# Patient Record
Sex: Female | Born: 1960 | Race: Black or African American | Hispanic: No | Marital: Married | Smoking: Never smoker
Health system: Southern US, Community
[De-identification: ages and names within clinical notes are randomized; demographics above are authoritative.]

## PROBLEM LIST (undated history)

## (undated) DIAGNOSIS — M1990 Unspecified osteoarthritis, unspecified site: Secondary | ICD-10-CM

## (undated) DIAGNOSIS — I10 Essential (primary) hypertension: Secondary | ICD-10-CM

## (undated) DIAGNOSIS — R7303 Prediabetes: Secondary | ICD-10-CM

## (undated) DIAGNOSIS — K432 Incisional hernia without obstruction or gangrene: Secondary | ICD-10-CM

## (undated) HISTORY — PX: HERNIA REPAIR: SHX51

## (undated) HISTORY — PX: CARPAL TUNNEL RELEASE: SHX101

## (undated) HISTORY — PX: PARTIAL KNEE ARTHROPLASTY: SHX2174

---

## 1992-04-10 HISTORY — PX: CARPAL TUNNEL RELEASE: SHX101

## 1994-04-10 HISTORY — PX: HERNIA REPAIR: SHX51

## 2011-10-08 ENCOUNTER — Emergency Department (HOSPITAL_COMMUNITY)
Admission: EM | Admit: 2011-10-08 | Discharge: 2011-10-08 | Disposition: A | Discharge status: Home / Self Care | Payer: BC Managed Care – PPO | Attending: Emergency Medicine | Admitting: Emergency Medicine

## 2011-10-08 ENCOUNTER — Encounter (HOSPITAL_COMMUNITY): Payer: Self-pay | Admitting: *Deleted

## 2011-10-08 ENCOUNTER — Emergency Department (HOSPITAL_COMMUNITY): Payer: BC Managed Care – PPO

## 2011-10-08 DIAGNOSIS — R071 Chest pain on breathing: Secondary | ICD-10-CM | POA: Insufficient documentation

## 2011-10-08 DIAGNOSIS — R109 Unspecified abdominal pain: Secondary | ICD-10-CM | POA: Insufficient documentation

## 2011-10-08 DIAGNOSIS — R0602 Shortness of breath: Secondary | ICD-10-CM | POA: Insufficient documentation

## 2011-10-08 DIAGNOSIS — R42 Dizziness and giddiness: Secondary | ICD-10-CM | POA: Insufficient documentation

## 2011-10-08 DIAGNOSIS — R0789 Other chest pain: Secondary | ICD-10-CM

## 2011-10-08 HISTORY — DX: Unspecified osteoarthritis, unspecified site: M19.90

## 2011-10-08 LAB — BASIC METABOLIC PANEL
BUN: 17 mg/dL (ref 6–23)
CO2: 25 mEq/L (ref 19–32)
Calcium: 9.2 mg/dL (ref 8.4–10.5)
Chloride: 102 mEq/L (ref 96–112)
Creatinine, Ser: 0.82 mg/dL (ref 0.50–1.10)
GFR calc Af Amer: >90 mL/min (ref >90)
GFR calc non Af Amer: 83 mL/min — ABNORMAL LOW (ref >90)
Glucose, Bld: 147 mg/dL — ABNORMAL HIGH (ref 70–99)
Potassium: 4.1 mEq/L (ref 3.5–5.1)
Sodium: 137 mEq/L (ref 135–145)

## 2011-10-08 LAB — CBC
HCT: 35.9 % — ABNORMAL LOW (ref 36.0–46.0)
Hemoglobin: 11.4 g/dL — ABNORMAL LOW (ref 12.0–15.0)
MCH: 28.3 pg (ref 26.0–34.0)
MCHC: 31.8 g/dL (ref 30.0–36.0)
MCV: 89.1 fL (ref 78.0–100.0)
Platelets: 195 10*3/uL (ref 150–400)
RBC: 4.03 MIL/uL (ref 3.87–5.11)
RDW: 12.8 % (ref 11.5–15.5)
WBC: 4.3 10*3/uL (ref 4.0–10.5)

## 2011-10-08 LAB — URINALYSIS, ROUTINE W REFLEX MICROSCOPIC
Bilirubin Urine: NEGATIVE
Glucose, UA: NEGATIVE mg/dL
Hgb urine dipstick: NEGATIVE
Ketones, ur: NEGATIVE mg/dL
Leukocytes, UA: NEGATIVE
Nitrite: NEGATIVE
Protein, ur: NEGATIVE mg/dL
Specific Gravity, Urine: 1.017 (ref 1.005–1.030)
Urobilinogen, UA: 0.2 mg/dL (ref 0.0–1.0)
pH: 6.5 (ref 5.0–8.0)

## 2011-10-08 LAB — D-DIMER, QUANTITATIVE: D-Dimer, Quant: 0.22 ug/mL-FEU (ref 0.00–0.48)

## 2011-10-08 MED ORDER — MECLIZINE HCL 50 MG PO TABS: 50.0000 mg | ORAL_TABLET | Freq: Three times a day (TID) | ORAL | Status: AC | PRN

## 2011-10-08 MED ORDER — MECLIZINE HCL 25 MG PO TABS
25.0000 mg | ORAL_TABLET | Freq: Once | ORAL | Status: AC
  Administered 2011-10-08: 25 mg via ORAL
  Filled 2011-10-08: qty 1

## 2011-10-08 NOTE — ED Notes (Signed)
Pt reports having left upper side pain since Thursday, woke up this am with dizziness and feeling off balance. No neuro deficits noted at triage.

## 2011-10-08 NOTE — Discharge Instructions (Signed)

## 2011-10-08 NOTE — ED Provider Notes (Addendum)
History     CSN: [id number]  Arrival date & time 10/08/11  1125   First MD Initiated Contact with Patient 10/08/11 1148      Chief Complaint  Patient presents with  . Dizziness  . Abdominal Pain    (Consider location/radiation/quality/duration/timing/severity/associated sxs/prior treatment) HPI Comments: Patient states also this morning when she was getting ready for church she got of bed and felt like the room was spinning. It was worse if she moved her head or tried to walk. It also caused her to feel nauseated. Symptoms have improved now however she still feels a little off balance.  Patient is a 51 y.o. female presenting with chest pain. The history is provided by the patient.  Chest Pain The chest pain began 3 - 5 days ago. Chest pain occurs constantly. The chest pain is unchanged. The pain is associated with breathing and coughing. At its most intense, the pain is at 6/10. The pain is currently at 4/10. The severity of the pain is moderate. The quality of the pain is described as pleuritic, sharp and stabbing. The pain radiates to the upper back. Chest pain is worsened by deep breathing. Primary symptoms include shortness of breath and dizziness. Pertinent negatives for primary symptoms include no fever, no cough, no palpitations, no abdominal pain, no nausea and no vomiting.  Dizziness does not occur with nausea, vomiting, weakness or diaphoresis.  Pertinent negatives for associated symptoms include no diaphoresis and no weakness. She tried NSAIDs for the symptoms. Risk factors include no known risk factors.  Pertinent negatives for past medical history include no diabetes, no hyperlipidemia, no hypertension, no MI and no PE.  Procedure history is negative for cardiac catheterization.     Past Medical History  Diagnosis Date  . Arthritis     History reviewed. No pertinent past surgical history.  History reviewed. No pertinent family history.  History  Substance Use Topics   . Smoking status: Not on file  . Smokeless tobacco: Not on file  . Alcohol Use: No    OB History    Grav Para Term Preterm Abortions TAB SAB Ect Mult Living                  Review of Systems  Constitutional: Negative for fever and diaphoresis.  Respiratory: Positive for shortness of breath. Negative for cough.   Cardiovascular: Positive for chest pain. Negative for palpitations.  Gastrointestinal: Negative for nausea, vomiting and abdominal pain.  Neurological: Positive for dizziness. Negative for weakness.  All other systems reviewed and are negative.    Allergies  Penicillins  Home Medications   Current Outpatient Rx  Name Route Sig Dispense Refill  . MELOXICAM 15 MG PO TABS Oral Take 15 mg by mouth daily.    Marland Kitchen NAPROXEN SODIUM 220 MG PO TABS Oral Take 440 mg by mouth daily as needed. For pain      BP 127/71  Pulse 76  Temp 98.2 F (36.8 C) (Oral)  Resp 18  SpO2 99%  LMP 09/22/2011  Physical Exam  Nursing note and vitals reviewed. Constitutional: She is oriented to person, place, and time. She appears well-developed and well-nourished. No distress.  HENT:  Head: Normocephalic and atraumatic.  Eyes: EOM are normal. Pupils are equal, round, and reactive to light.  Cardiovascular: Normal rate, regular rhythm, normal heart sounds and intact distal pulses.  Exam reveals no friction rub.   No murmur heard. Pulmonary/Chest: Effort normal and breath sounds normal. She has no wheezes.  She has no rales.   She exhibits tenderness.    Abdominal: Soft. Bowel sounds are normal. She exhibits no distension. There is no tenderness. There is no rebound and no guarding.  Musculoskeletal: Normal range of motion. She exhibits no tenderness.       No edema  Neurological: She is alert and oriented to person, place, and time. She has normal strength. No cranial nerve deficit or sensory deficit. She displays a negative Romberg sign. Coordination and gait normal.  Skin: Skin is  warm and dry. No rash noted.  Psychiatric: She has a normal mood and affect. Her behavior is normal.    ED Course  Procedures (including critical care time)  Labs Reviewed  CBC - Abnormal; Notable for the following:    Hemoglobin 11.4 (*)     HCT 35.9 (*)     All other components within normal limits  BASIC METABOLIC PANEL - Abnormal; Notable for the following:    Glucose, Bld 147 (*)     GFR calc non Af Amer 83 (*)     All other components within normal limits  D-DIMER, QUANTITATIVE  URINALYSIS, ROUTINE W REFLEX MICROSCOPIC   Dg Chest 2 View  10/08/2011  *RADIOLOGY REPORT*  Clinical Data: Chest pain  CHEST - 2 VIEW  Comparison: None  Findings: The heart size and mediastinal contours are within normal limits.  Both lungs are clear.  The visualized skeletal structures are unremarkable.  IMPRESSION: Negative exam.  Original Report Authenticated By: Rosealee Albee, M.D.    Date: 10/08/2011  Rate: 58  Rhythm: normal sinus rhythm  QRS Axis: normal  Intervals: normal  ST/T Wave abnormalities: normal  Conduction Disutrbances: none  Narrative Interpretation: unremarkable      1. Vertigo   2. Chest wall pain       MDM   Patient with no significant medical history has had left-sided pleuritic chest pain since Tuesday. She's had mild shortness of breath with ambulation but none while sitting. The pain is worse with deep breaths and with palpation. She denies any injury. She has no abdominal pain and pain is not worsened with eating. Today Pt with sx most consistent with peripheral vertigo.  No systemic or infectious sx.  Normal neuro exam without weakness, ataxia or cerebellar findings on exam.  Normal vision.  Sx are reproducible with movement of the head and attempting to walk.  No hx of Stroke and low likelihood.  No risk factors and normal VS. Will treat for peripheral vertigo and re-eval.  Also low risk well's so will get d-dimer.  CBC, BMP, CXR pending.  2:32 PM All  labs wnl.  Pt feeling better after meclizine and has normal gait and was able to walk without assistance.      Gwyneth Sprout, MD 10/08/11 1433  Gwyneth Sprout, MD 10/08/11 1505

## 2012-03-08 ENCOUNTER — Emergency Department (HOSPITAL_COMMUNITY)
Admission: EM | Admit: 2012-03-08 | Discharge: 2012-03-08 | Disposition: A | Discharge status: Home / Self Care | Payer: No Typology Code available for payment source | Attending: Emergency Medicine | Admitting: Emergency Medicine

## 2012-03-08 ENCOUNTER — Emergency Department (HOSPITAL_COMMUNITY): Payer: No Typology Code available for payment source

## 2012-03-08 ENCOUNTER — Encounter (HOSPITAL_COMMUNITY): Payer: Self-pay | Admitting: Emergency Medicine

## 2012-03-08 DIAGNOSIS — S0993XA Unspecified injury of face, initial encounter: Secondary | ICD-10-CM | POA: Insufficient documentation

## 2012-03-08 DIAGNOSIS — Y92410 Unspecified street and highway as the place of occurrence of the external cause: Secondary | ICD-10-CM | POA: Insufficient documentation

## 2012-03-08 DIAGNOSIS — Z8739 Personal history of other diseases of the musculoskeletal system and connective tissue: Secondary | ICD-10-CM | POA: Insufficient documentation

## 2012-03-08 DIAGNOSIS — S8990XA Unspecified injury of unspecified lower leg, initial encounter: Secondary | ICD-10-CM | POA: Insufficient documentation

## 2012-03-08 DIAGNOSIS — M25569 Pain in unspecified knee: Secondary | ICD-10-CM

## 2012-03-08 DIAGNOSIS — S199XXA Unspecified injury of neck, initial encounter: Secondary | ICD-10-CM | POA: Insufficient documentation

## 2012-03-08 DIAGNOSIS — M542 Cervicalgia: Secondary | ICD-10-CM

## 2012-03-08 DIAGNOSIS — Y939 Activity, unspecified: Secondary | ICD-10-CM | POA: Insufficient documentation

## 2012-03-08 MED ORDER — HYDROCODONE-ACETAMINOPHEN 5-325 MG PO TABS: 2.0000 | ORAL_TABLET | ORAL | Status: DC | PRN

## 2012-03-08 MED ORDER — METHOCARBAMOL 500 MG PO TABS: 500.0000 mg | ORAL_TABLET | Freq: Two times a day (BID) | ORAL | Status: DC | PRN

## 2012-03-08 MED ORDER — HYDROCODONE-ACETAMINOPHEN 5-325 MG PO TABS
2.0000 | ORAL_TABLET | Freq: Once | ORAL | Status: AC
  Administered 2012-03-08: 2 via ORAL
  Filled 2012-03-08: qty 2

## 2012-03-08 MED ORDER — MELOXICAM 15 MG PO TABS: 15.0000 mg | ORAL_TABLET | Freq: Every day | ORAL | Status: DC
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
## 2012-03-10 NOTE — ED Provider Notes (Signed)
Medical screening examination/treatment/procedure(s) were performed by non-physician practitioner and as supervising physician I was immediately available for consultation/collaboration.  Seth Higginbotham T Sarh Kirschenbaum, MD 03/10/12 2216 

## 2012-07-03 ENCOUNTER — Other Ambulatory Visit (HOSPITAL_COMMUNITY): Payer: Self-pay | Admitting: Obstetrics and Gynecology

## 2012-07-03 DIAGNOSIS — Z1231 Encounter for screening mammogram for malignant neoplasm of breast: Secondary | ICD-10-CM

## 2012-07-09 ENCOUNTER — Encounter (HOSPITAL_COMMUNITY): Payer: Self-pay | Admitting: Emergency Medicine

## 2012-07-09 ENCOUNTER — Emergency Department (INDEPENDENT_AMBULATORY_CARE_PROVIDER_SITE_OTHER)
Admission: EM | Admit: 2012-07-09 | Discharge: 2012-07-09 | Disposition: A | Discharge status: Home / Self Care | Payer: Worker's Compensation | Source: Home / Self Care | Attending: Family Medicine | Admitting: Family Medicine

## 2012-07-09 DIAGNOSIS — S8002XA Contusion of left knee, initial encounter: Secondary | ICD-10-CM

## 2012-07-09 DIAGNOSIS — S8001XA Contusion of right knee, initial encounter: Secondary | ICD-10-CM

## 2012-07-09 DIAGNOSIS — S8000XA Contusion of unspecified knee, initial encounter: Secondary | ICD-10-CM

## 2012-07-09 MED ORDER — HYDROCODONE-ACETAMINOPHEN 5-325 MG PO TABS: 2.0000 | ORAL_TABLET | ORAL | Status: DC | PRN
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
## 2012-07-11 ENCOUNTER — Ambulatory Visit (HOSPITAL_COMMUNITY)
Admission: RE | Admit: 2012-07-11 | Discharge: 2012-07-11 | Disposition: A | Discharge status: Home / Self Care | Payer: BC Managed Care – PPO | Source: Ambulatory Visit | Attending: Obstetrics and Gynecology | Admitting: Obstetrics and Gynecology

## 2012-07-11 DIAGNOSIS — Z1231 Encounter for screening mammogram for malignant neoplasm of breast: Secondary | ICD-10-CM | POA: Insufficient documentation

## 2012-07-11 NOTE — ED Provider Notes (Signed)
Medical screening examination/treatment/procedure(s) were performed by resident physician or non-physician practitioner and as supervising physician I was immediately available for consultation/collaboration.   Lilu Mcglown DOUGLAS MD.   Keajah Killough D Llewellyn Choplin, MD 07/11/12 1955 

## 2012-09-27 ENCOUNTER — Encounter (HOSPITAL_COMMUNITY): Payer: Self-pay

## 2012-09-30 ENCOUNTER — Other Ambulatory Visit: Payer: Self-pay | Admitting: Obstetrics and Gynecology

## 2012-10-03 ENCOUNTER — Encounter (HOSPITAL_COMMUNITY): Payer: Self-pay

## 2012-10-03 ENCOUNTER — Encounter (HOSPITAL_COMMUNITY)
Admission: RE | Admit: 2012-10-03 | Discharge: 2012-10-03 | Disposition: A | Discharge status: Home / Self Care | Payer: BC Managed Care – PPO | Source: Ambulatory Visit | Attending: Obstetrics and Gynecology | Admitting: Obstetrics and Gynecology

## 2012-10-03 HISTORY — DX: Essential (primary) hypertension: I10

## 2012-10-03 LAB — BASIC METABOLIC PANEL
BUN: 11 mg/dL (ref 6–23)
CO2: 25 mEq/L (ref 19–32)
Calcium: 9.3 mg/dL (ref 8.4–10.5)
Chloride: 105 mEq/L (ref 96–112)
Creatinine, Ser: 0.89 mg/dL (ref 0.50–1.10)
GFR calc Af Amer: 86 mL/min — ABNORMAL LOW (ref >90)
GFR calc non Af Amer: 74 mL/min — ABNORMAL LOW (ref >90)
Glucose, Bld: 99 mg/dL (ref 70–99)
Potassium: 4.1 mEq/L (ref 3.5–5.1)
Sodium: 139 mEq/L (ref 135–145)

## 2012-10-03 LAB — CBC
HCT: 34 % — ABNORMAL LOW (ref 36.0–46.0)
Hemoglobin: 10.9 g/dL — ABNORMAL LOW (ref 12.0–15.0)
MCH: 28.2 pg (ref 26.0–34.0)
MCHC: 32.1 g/dL (ref 30.0–36.0)
MCV: 88.1 fL (ref 78.0–100.0)
Platelets: 216 10*3/uL (ref 150–400)
RBC: 3.86 MIL/uL — ABNORMAL LOW (ref 3.87–5.11)
RDW: 13.2 % (ref 11.5–15.5)
WBC: 4 10*3/uL (ref 4.0–10.5)

## 2012-10-03 LAB — SURGICAL PCR SCREEN
MRSA, PCR: NEGATIVE
Staphylococcus aureus: NEGATIVE

## 2012-10-03 NOTE — Patient Instructions (Addendum)
20 [name]  10/03/2012   Your procedure is scheduled on:  10/07/12  Enter through the Main Entrance of [facility] at 645 AM.  Pick up the phone at the desk and dial [phone].   Call this number if you have problems the morning of surgery: [phone]   Remember:   Do not eat food:After Midnight.  Do not drink clear liquids: After Midnight.  Take these medicines the morning of surgery with A SIP OF WATER: Blood pressure medication   Do not wear jewelry, make-up or nail polish.  Do not wear lotions, powders, or perfumes. You may wear deodorant.  Do not shave 48 hours prior to surgery.  Do not bring valuables to the hospital.  [facility] is not responsible                  for any belongings or valuables brought to the hospital.  Contacts, dentures or bridgework may not be worn into surgery.  Leave suitcase in the car. After surgery it may be brought to your room.  For patients admitted to the hospital, checkout time is 11:00 AM the day of                discharge.   Patients discharged the day of surgery will not be allowed to drive                   home.  Name and phone number of your driver: NA  Special Instructions: Shower using CHG 2 nights before surgery and the night before surgery.  If you shower the day of surgery use CHG.  Use special wash - you have one bottle of CHG for all showers.  You should use approximately 1/3 of the bottle for each shower.   Please read over the following fact sheets that you were given: MRSA Information

## 2012-10-06 MED ORDER — CIPROFLOXACIN IN D5W 400 MG/200ML IV SOLN
400.0000 mg | INTRAVENOUS | Status: AC
  Administered 2012-10-07: 400 mg via INTRAVENOUS
  Filled 2012-10-06: qty 200

## 2012-10-06 MED ORDER — CLINDAMYCIN PHOSPHATE 900 MG/50ML IV SOLN
900.0000 mg | INTRAVENOUS | Status: AC
  Administered 2012-10-07: 900 mg via INTRAVENOUS
  Filled 2012-10-06: qty 50

## 2012-10-07 ENCOUNTER — Encounter (HOSPITAL_COMMUNITY)
Admission: RE | Disposition: A | Discharge status: Home / Self Care | Payer: Self-pay | Source: Ambulatory Visit | Attending: Obstetrics and Gynecology

## 2012-10-07 ENCOUNTER — Ambulatory Visit (HOSPITAL_COMMUNITY): Payer: BC Managed Care – PPO | Admitting: Anesthesiology

## 2012-10-07 ENCOUNTER — Encounter (HOSPITAL_COMMUNITY): Payer: Self-pay | Admitting: Anesthesiology

## 2012-10-07 ENCOUNTER — Ambulatory Visit (HOSPITAL_COMMUNITY)
Admission: RE | Admit: 2012-10-07 | Discharge: 2012-10-08 | Disposition: A | Discharge status: Home / Self Care | Payer: BC Managed Care – PPO | Source: Ambulatory Visit | Attending: Obstetrics and Gynecology | Admitting: Obstetrics and Gynecology

## 2012-10-07 DIAGNOSIS — N838 Other noninflammatory disorders of ovary, fallopian tube and broad ligament: Secondary | ICD-10-CM | POA: Insufficient documentation

## 2012-10-07 DIAGNOSIS — Z90710 Acquired absence of both cervix and uterus: Secondary | ICD-10-CM | POA: Diagnosis not present

## 2012-10-07 DIAGNOSIS — D251 Intramural leiomyoma of uterus: Secondary | ICD-10-CM | POA: Insufficient documentation

## 2012-10-07 DIAGNOSIS — N920 Excessive and frequent menstruation with regular cycle: Secondary | ICD-10-CM | POA: Diagnosis present

## 2012-10-07 HISTORY — PX: LAPAROSCOPIC BILATERAL SALPINGECTOMY: SHX5889

## 2012-10-07 HISTORY — PX: ROBOTIC ASSISTED LAPAROSCOPIC LYSIS OF ADHESION: SHX6080

## 2012-10-07 HISTORY — PX: ROBOTIC ASSISTED TOTAL HYSTERECTOMY: SHX6085

## 2012-10-07 SURGERY — ROBOTIC ASSISTED TOTAL HYSTERECTOMY
Anesthesia: General | Site: Abdomen | Wound class: Clean Contaminated

## 2012-10-07 MED ORDER — ONDANSETRON HCL 4 MG/2ML IJ SOLN
INTRAMUSCULAR | Status: DC | PRN
  Administered 2012-10-07: 4 mg via INTRAVENOUS

## 2012-10-07 MED ORDER — FENTANYL CITRATE 0.05 MG/ML IJ SOLN
INTRAMUSCULAR | Status: AC
  Filled 2012-10-07: qty 10

## 2012-10-07 MED ORDER — DEXAMETHASONE SODIUM PHOSPHATE 4 MG/ML IJ SOLN
INTRAMUSCULAR | Status: DC | PRN
  Administered 2012-10-07: 10 mg via INTRAVENOUS

## 2012-10-07 MED ORDER — MEPERIDINE HCL 25 MG/ML IJ SOLN: 6.2500 mg | INTRAMUSCULAR | Status: DC | PRN

## 2012-10-07 MED ORDER — BUPIVACAINE HCL (PF) 0.25 % IJ SOLN
INTRAMUSCULAR | Status: DC | PRN
  Administered 2012-10-07: 10 mL

## 2012-10-07 MED ORDER — MIDAZOLAM HCL 5 MG/5ML IJ SOLN
INTRAMUSCULAR | Status: DC | PRN
  Administered 2012-10-07: 2 mg via INTRAVENOUS

## 2012-10-07 MED ORDER — OXYCODONE-ACETAMINOPHEN 5-325 MG PO TABS
1.0000 | ORAL_TABLET | ORAL | Status: DC | PRN
  Administered 2012-10-07: 1 via ORAL
  Administered 2012-10-07 – 2012-10-08 (×3): 2 via ORAL
  Filled 2012-10-07 (×2): qty 2
  Filled 2012-10-07: qty 1
  Filled 2012-10-07: qty 2

## 2012-10-07 MED ORDER — NEOSTIGMINE METHYLSULFATE 1 MG/ML IJ SOLN
INTRAMUSCULAR | Status: DC | PRN
  Administered 2012-10-07: 4 mg via INTRAVENOUS

## 2012-10-07 MED ORDER — LACTATED RINGERS IV SOLN
INTRAVENOUS | Status: DC
  Administered 2012-10-07 (×2): via INTRAVENOUS

## 2012-10-07 MED ORDER — INDIGOTINDISULFONATE SODIUM 8 MG/ML IJ SOLN
INTRAMUSCULAR | Status: AC
  Filled 2012-10-07: qty 5

## 2012-10-07 MED ORDER — DEXTROSE IN LACTATED RINGERS 5 % IV SOLN
INTRAVENOUS | Status: DC
  Administered 2012-10-07 (×2): via INTRAVENOUS

## 2012-10-07 MED ORDER — ZOLPIDEM TARTRATE 5 MG PO TABS: 5.0000 mg | ORAL_TABLET | Freq: Every evening | ORAL | Status: DC | PRN

## 2012-10-07 MED ORDER — ONDANSETRON HCL 4 MG/2ML IJ SOLN
4.0000 mg | Freq: Four times a day (QID) | INTRAMUSCULAR | Status: DC | PRN
  Administered 2012-10-07: 4 mg via INTRAVENOUS
  Filled 2012-10-07: qty 2

## 2012-10-07 MED ORDER — FENTANYL CITRATE 0.05 MG/ML IJ SOLN
INTRAMUSCULAR | Status: DC | PRN
  Administered 2012-10-07 (×3): 100 ug via INTRAVENOUS
  Administered 2012-10-07: 50 ug via INTRAVENOUS

## 2012-10-07 MED ORDER — PROPOFOL 10 MG/ML IV BOLUS
INTRAVENOUS | Status: DC | PRN
  Administered 2012-10-07: 160 ug via INTRAVENOUS

## 2012-10-07 MED ORDER — MIDAZOLAM HCL 2 MG/2ML IJ SOLN
INTRAMUSCULAR | Status: AC
  Filled 2012-10-07: qty 2

## 2012-10-07 MED ORDER — ONDANSETRON HCL 4 MG/2ML IJ SOLN
INTRAMUSCULAR | Status: AC
  Filled 2012-10-07: qty 2

## 2012-10-07 MED ORDER — PROPOFOL 10 MG/ML IV EMUL
INTRAVENOUS | Status: AC
  Filled 2012-10-07: qty 20

## 2012-10-07 MED ORDER — ONDANSETRON HCL 4 MG PO TABS
4.0000 mg | ORAL_TABLET | Freq: Four times a day (QID) | ORAL | Status: DC | PRN
  Administered 2012-10-08: 4 mg via ORAL
  Filled 2012-10-07: qty 1

## 2012-10-07 MED ORDER — ROCURONIUM BROMIDE 50 MG/5ML IV SOLN
INTRAVENOUS | Status: AC
  Filled 2012-10-07: qty 1

## 2012-10-07 MED ORDER — LIDOCAINE HCL (CARDIAC) 20 MG/ML IV SOLN
INTRAVENOUS | Status: AC
  Filled 2012-10-07: qty 5

## 2012-10-07 MED ORDER — DEXAMETHASONE SODIUM PHOSPHATE 10 MG/ML IJ SOLN
INTRAMUSCULAR | Status: AC
  Filled 2012-10-07: qty 1

## 2012-10-07 MED ORDER — FENTANYL CITRATE 0.05 MG/ML IJ SOLN: 25.0000 ug | INTRAMUSCULAR | Status: DC | PRN

## 2012-10-07 MED ORDER — ARTIFICIAL TEARS OP OINT
TOPICAL_OINTMENT | OPHTHALMIC | Status: AC
  Filled 2012-10-07: qty 3.5

## 2012-10-07 MED ORDER — HYDROMORPHONE HCL PF 1 MG/ML IJ SOLN
0.2000 mg | INTRAMUSCULAR | Status: DC | PRN
  Administered 2012-10-07: 0.5 mg via INTRAVENOUS
  Filled 2012-10-07: qty 1

## 2012-10-07 MED ORDER — LACTATED RINGERS IR SOLN
Status: DC | PRN
  Administered 2012-10-07: 3000 mL

## 2012-10-07 MED ORDER — MENTHOL 3 MG MT LOZG: 1.0000 | LOZENGE | OROMUCOSAL | Status: DC | PRN

## 2012-10-07 MED ORDER — ONDANSETRON HCL 4 MG/2ML IJ SOLN: 4.0000 mg | Freq: Once | INTRAMUSCULAR | Status: DC | PRN

## 2012-10-07 MED ORDER — HYDROMORPHONE HCL PF 1 MG/ML IJ SOLN
INTRAMUSCULAR | Status: DC | PRN
  Administered 2012-10-07: 1 mg via INTRAVENOUS

## 2012-10-07 MED ORDER — LIDOCAINE HCL (CARDIAC) 20 MG/ML IV SOLN
INTRAVENOUS | Status: DC | PRN
  Administered 2012-10-07: 50 mg via INTRAVENOUS

## 2012-10-07 MED ORDER — ROCURONIUM BROMIDE 100 MG/10ML IV SOLN
INTRAVENOUS | Status: DC | PRN
  Administered 2012-10-07: 50 mg via INTRAVENOUS
  Administered 2012-10-07: 10 mg via INTRAVENOUS
  Administered 2012-10-07: 20 mg via INTRAVENOUS

## 2012-10-07 MED ORDER — AMLODIPINE BESYLATE 5 MG PO TABS
5.0000 mg | ORAL_TABLET | Freq: Every day | ORAL | Status: DC
  Filled 2012-10-07 (×3): qty 1

## 2012-10-07 MED ORDER — ACETAMINOPHEN 10 MG/ML IV SOLN
INTRAVENOUS | Status: AC
  Filled 2012-10-07: qty 100

## 2012-10-07 MED ORDER — ACETAMINOPHEN 10 MG/ML IV SOLN
1000.0000 mg | Freq: Four times a day (QID) | INTRAVENOUS | Status: AC
  Filled 2012-10-07 (×4): qty 100

## 2012-10-07 MED ORDER — GLYCOPYRROLATE 0.2 MG/ML IJ SOLN
INTRAMUSCULAR | Status: AC
  Filled 2012-10-07: qty 3

## 2012-10-07 MED ORDER — PANTOPRAZOLE SODIUM 40 MG PO TBEC
40.0000 mg | DELAYED_RELEASE_TABLET | Freq: Every day | ORAL | Status: DC
  Administered 2012-10-08: 40 mg via ORAL
  Filled 2012-10-07 (×3): qty 1

## 2012-10-07 MED ORDER — BUPIVACAINE HCL (PF) 0.25 % IJ SOLN
INTRAMUSCULAR | Status: AC
  Filled 2012-10-07: qty 30

## 2012-10-07 MED ORDER — HYDROMORPHONE HCL PF 1 MG/ML IJ SOLN
INTRAMUSCULAR | Status: AC
  Filled 2012-10-07: qty 1

## 2012-10-07 MED ORDER — ACETAMINOPHEN 10 MG/ML IV SOLN
1000.0000 mg | Freq: Four times a day (QID) | INTRAVENOUS | Status: DC
  Administered 2012-10-07: 1000 mg via INTRAVENOUS

## 2012-10-07 MED ORDER — NEOSTIGMINE METHYLSULFATE 1 MG/ML IJ SOLN
INTRAMUSCULAR | Status: AC
  Filled 2012-10-07: qty 1

## 2012-10-07 MED ORDER — GLYCOPYRROLATE 0.2 MG/ML IJ SOLN
INTRAMUSCULAR | Status: DC | PRN
  Administered 2012-10-07: 0.6 mg via INTRAVENOUS
  Administered 2012-10-07: 0.2 mg via INTRAVENOUS

## 2012-10-07 MED ORDER — ACETAMINOPHEN 10 MG/ML IV SOLN: 1000.0000 mg | Freq: Once | INTRAVENOUS | Status: DC | PRN

## 2012-10-07 SURGICAL SUPPLY — 81 items
APPLICATOR COTTON TIP 6IN STRL (MISCELLANEOUS) ×3 IMPLANT
BAG URINE DRAINAGE (UROLOGICAL SUPPLIES) ×3 IMPLANT
BARRIER ADHS 3X4 INTERCEED (GAUZE/BANDAGES/DRESSINGS) ×3 IMPLANT
CABLE HIGH FREQUENCY MONO STRZ (ELECTRODE) IMPLANT
CATH FOLEY 3WAY  5CC 16FR (CATHETERS) ×1
CATH FOLEY 3WAY 5CC 16FR (CATHETERS) ×2 IMPLANT
CATH ROBINSON RED A/P 16FR (CATHETERS) IMPLANT
CHLORAPREP W/TINT 26ML (MISCELLANEOUS) ×3 IMPLANT
CLOTH BEACON ORANGE TIMEOUT ST (SAFETY) ×3 IMPLANT
CONT PATH 16OZ SNAP LID 3702 (MISCELLANEOUS) ×3 IMPLANT
COVER MAYO STAND STRL (DRAPES) ×3 IMPLANT
COVER TABLE BACK 60X90 (DRAPES) ×6 IMPLANT
COVER TIP SHEARS 8 DVNC (MISCELLANEOUS) ×2 IMPLANT
COVER TIP SHEARS 8MM DA VINCI (MISCELLANEOUS) ×1
DECANTER SPIKE VIAL GLASS SM (MISCELLANEOUS) ×3 IMPLANT
DERMABOND ADVANCED (GAUZE/BANDAGES/DRESSINGS) ×1
DERMABOND ADVANCED .7 DNX12 (GAUZE/BANDAGES/DRESSINGS) ×2 IMPLANT
DEVICE TROCAR PUNCTURE CLOSURE (ENDOMECHANICALS) IMPLANT
DRAPE HUG U DISPOSABLE (DRAPE) ×3 IMPLANT
DRAPE LG THREE QUARTER DISP (DRAPES) ×6 IMPLANT
DRAPE ROBOTICS STRL (DRAPES) ×3 IMPLANT
DRAPE WARM FLUID 44X44 (DRAPE) ×3 IMPLANT
DRIVER LRG NEEDLE DA VINCI (INSTRUMENTS)
DRIVER NDLE LRG DVNC (INSTRUMENTS) IMPLANT
ELECT LIGASURE LONG (ELECTRODE) IMPLANT
ELECT REM PT RETURN 9FT ADLT (ELECTROSURGICAL) ×3
ELECTRODE REM PT RTRN 9FT ADLT (ELECTROSURGICAL) ×2 IMPLANT
EVACUATOR SMOKE 8.L (FILTER) ×6 IMPLANT
FORCEPS CUTTING 33CM 5MM (CUTTING FORCEPS) IMPLANT
FORCEPS CUTTING 45CM 5MM (CUTTING FORCEPS) IMPLANT
GAUZE VASELINE 3X9 (GAUZE/BANDAGES/DRESSINGS) IMPLANT
GLOVE BIO SURGEON STRL SZ 6.5 (GLOVE) ×3 IMPLANT
GLOVE BIOGEL PI IND STRL 7.0 (GLOVE) ×4 IMPLANT
GLOVE BIOGEL PI INDICATOR 7.0 (GLOVE) ×2
GOWN PREVENTION PLUS LG XLONG (DISPOSABLE) ×9 IMPLANT
GOWN STRL REIN XL XLG (GOWN DISPOSABLE) ×27 IMPLANT
HEMOSTAT SURGICEL 2X3 (HEMOSTASIS) ×3 IMPLANT
KIT ACCESSORY DA VINCI DISP (KITS) ×1
KIT ACCESSORY DVNC DISP (KITS) ×2 IMPLANT
LEGGING LITHOTOMY PAIR STRL (DRAPES) ×3 IMPLANT
NEEDLE INSUFFLATION 120MM (ENDOMECHANICALS) IMPLANT
NEEDLE INSUFFLATION 14GA 120MM (NEEDLE) ×3 IMPLANT
NEEDLE INSUFFLATION 150MM (ENDOMECHANICALS) ×3 IMPLANT
NS IRRIG 1000ML POUR BTL (IV SOLUTION) IMPLANT
OCCLUDER COLPOPNEUMO (BALLOONS) ×3 IMPLANT
PACK LAPAROSCOPY BASIN (CUSTOM PROCEDURE TRAY) IMPLANT
PACK LAVH (CUSTOM PROCEDURE TRAY) ×3 IMPLANT
PAD PREP 24X48 CUFFED NSTRL (MISCELLANEOUS) ×6 IMPLANT
PLUG CATH AND CAP STER (CATHETERS) ×3 IMPLANT
POUCH SPECIMEN RETRIEVAL 10MM (ENDOMECHANICALS) IMPLANT
PROTECTOR NERVE ULNAR (MISCELLANEOUS) ×12 IMPLANT
SCISSORS LAP 5X35 DISP (ENDOMECHANICALS) IMPLANT
SET CYSTO W/LG BORE CLAMP LF (SET/KITS/TRAYS/PACK) IMPLANT
SET IRRIG TUBING LAPAROSCOPIC (IRRIGATION / IRRIGATOR) ×3 IMPLANT
SOLUTION ELECTROLUBE (MISCELLANEOUS) ×3 IMPLANT
SUT VIC AB 0 CT1 36 (SUTURE) ×6 IMPLANT
SUT VICRYL 0 UR6 27IN ABS (SUTURE) ×3 IMPLANT
SUT VICRYL 4-0 PS2 18IN ABS (SUTURE) ×6 IMPLANT
SUT VLOC 180 0 9IN  GS21 (SUTURE) ×2
SUT VLOC 180 0 9IN GS21 (SUTURE) ×4 IMPLANT
SYR 50ML LL SCALE MARK (SYRINGE) ×3 IMPLANT
SYRINGE 10CC LL (SYRINGE) ×3 IMPLANT
SYSTEM CONVERTIBLE TROCAR (TROCAR) ×3 IMPLANT
TIP RUMI ORANGE 6.7MMX12CM (TIP) IMPLANT
TIP UTERINE 5.1X6CM LAV DISP (MISCELLANEOUS) IMPLANT
TIP UTERINE 6.7X10CM GRN DISP (MISCELLANEOUS) ×3 IMPLANT
TIP UTERINE 6.7X6CM WHT DISP (MISCELLANEOUS) IMPLANT
TIP UTERINE 6.7X8CM BLUE DISP (MISCELLANEOUS) IMPLANT
TOWEL OR 17X24 6PK STRL BLUE (TOWEL DISPOSABLE) ×9 IMPLANT
TROCAR 12M 150ML BLUNT (TROCAR) IMPLANT
TROCAR BALLN 12MMX100 BLUNT (TROCAR) IMPLANT
TROCAR DISP BLADELESS 8 DVNC (TROCAR) ×2 IMPLANT
TROCAR DISP BLADELESS 8MM (TROCAR) ×1
TROCAR OPTI TIP 5M 100M (ENDOMECHANICALS) IMPLANT
TROCAR XCEL 12X100 BLDLESS (ENDOMECHANICALS) ×3 IMPLANT
TROCAR XCEL DIL TIP R 11M (ENDOMECHANICALS) IMPLANT
TROCAR XCEL NON-BLD 5MMX100MML (ENDOMECHANICALS) ×3 IMPLANT
TROCAR Z-THREAD 12X150 (TROCAR) ×3 IMPLANT
TUBING FILTER THERMOFLATOR (ELECTROSURGICAL) ×3 IMPLANT
WARMER LAPAROSCOPE (MISCELLANEOUS) ×3 IMPLANT
WATER STERILE IRR 1000ML POUR (IV SOLUTION) ×9 IMPLANT

## 2012-10-07 NOTE — Anesthesia Procedure Notes (Signed)
Procedure Name: Intubation Date/Time: 10/07/2012 7:29 AM Performed by: Shanon Payor Pre-anesthesia Checklist: Suction available, Emergency Drugs available, Timeout performed, Patient identified and Patient being monitored Patient Re-evaluated:Patient Re-evaluated prior to inductionOxygen Delivery Method: Circle system utilized Preoxygenation: Pre-oxygenation with 100% oxygen Intubation Type: IV induction Ventilation: Mask ventilation without difficulty Laryngoscope Size: Mac and 3 Grade View: Grade I Tube type: Oral Tube size: 7.0 mm Number of attempts: 1 Airway Equipment and Method: Stylet Secured at: 22 cm Tube secured with: Tape Dental Injury: Teeth and Oropharynx as per pre-operative assessment

## 2012-10-07 NOTE — Anesthesia Preprocedure Evaluation (Signed)
Anesthesia Evaluation  Patient identified by MRN, date of birth, ID band Patient awake    Reviewed: Allergy & Precautions, H&P , NPO status , Patient's Chart, lab work & pertinent test results  Airway Mallampati: II TM Distance: >3 FB Neck ROM: full    Dental no notable dental hx. (+) Teeth Intact   Pulmonary neg pulmonary ROS,    Pulmonary exam normal       Cardiovascular hypertension, Pt. on medications     Neuro/Psych negative neurological ROS  negative psych ROS   GI/Hepatic negative GI ROS, Neg liver ROS,   Endo/Other  Morbid obesity  Renal/GU negative Renal ROS  negative genitourinary   Musculoskeletal negative musculoskeletal ROS (+)   Abdominal Normal abdominal exam  (+)   Peds negative pediatric ROS (+)  Hematology negative hematology ROS (+)   Anesthesia Other Findings   Reproductive/Obstetrics negative OB ROS                           Anesthesia Physical Anesthesia Plan  ASA: III  Anesthesia Plan: General   Post-op Pain Management:    Induction: Intravenous  Airway Management Planned: Oral ETT  Additional Equipment:   Intra-op Plan:   Post-operative Plan: Extubation in OR  Informed Consent: I have reviewed the patients History and Physical, chart, labs and discussed the procedure including the risks, benefits and alternatives for the proposed anesthesia with the patient or authorized representative who has indicated his/her understanding and acceptance.   Dental Advisory Given  Plan Discussed with: CRNA, Surgeon and Anesthesiologist  Anesthesia Plan Comments:         Anesthesia Quick Evaluation

## 2012-10-07 NOTE — Transfer of Care (Signed)
Immediate Anesthesia Transfer of Care Note  Patient: [name]  Procedure(s) Performed: Procedure(s): ROBOTIC ASSISTED TOTAL HYSTERECTOMY (N/A) LAPAROSCOPIC BILATERAL SALPINGECTOMY (Bilateral) ROBOTIC ASSISTED LAPAROSCOPIC LYSIS OF ADHESION (N/A)  Patient Location: PACU  Anesthesia Type:General  Level of Consciousness: awake, alert  and oriented  Airway & Oxygen Therapy: Patient Spontanous Breathing and Patient connected to nasal cannula oxygen  Post-op Assessment: Report given to PACU RN and Post -op Vital signs reviewed and stable  Post vital signs: Reviewed and stable  Complications: No apparent anesthesia complications

## 2012-10-07 NOTE — Anesthesia Postprocedure Evaluation (Signed)
  Anesthesia Post-op Note  Patient: Margrett Rud  Procedure(s) Performed: Procedure(s): ROBOTIC ASSISTED TOTAL HYSTERECTOMY (N/A) LAPAROSCOPIC BILATERAL SALPINGECTOMY (Bilateral) ROBOTIC ASSISTED LAPAROSCOPIC LYSIS OF ADHESION (N/A)  Patient Location: [facility]  Anesthesia Type:General  Level of Consciousness: awake, alert  and oriented  Airway and Oxygen Therapy: Patient Spontanous Breathing and Patient connected to nasal cannula oxygen  Post-op Pain: mild  Post-op Assessment: Post-op Vital signs reviewed and Patient's Cardiovascular Status Stable  Post-op Vital Signs: Reviewed and stable  Complications: No apparent anesthesia complications

## 2012-10-07 NOTE — Brief Op Note (Signed)
10/07/2012  10:45 AM  PATIENT:  [name]  52 y.o. female  PRE-OPERATIVE DIAGNOSIS:  Menorrhagia, Uterine Fibroids  [id number]  POST-OPERATIVE DIAGNOSIS:  Menorrhagia, Uterine Fibroids  PROCEDURE:  DaVinci robotic total hysterectomy, bilateral salpingectomy. Lysis of adhesions  SURGEON:  Surgeon(s) and Role:    * Chimere Klingensmith Cathie Beams, MD - Primary  PHYSICIAN ASSISTANT:   ASSISTANTS: Marlinda Mike, CNM   ANESTHESIA:   general Findings: omentum to ant abd wall , fibroid uterus, nl ureters, nl ovaries, surgical separated tubes EBL:  Total I/O In: 1500 [I.V.:1500] Out: 460 [Urine:360; Blood:100]  BLOOD ADMINISTERED:none  DRAINS: none   LOCAL MEDICATIONS USED:  MARCAINE     SPECIMEN:  Source of Specimen:  uterus w/ cervix, tubes  DISPOSITION OF SPECIMEN:  PATHOLOGY  COUNTS:  YES  TOURNIQUET:  * No tourniquets in log *  DICTATION: .Other Dictation: Dictation Number [id number]  PLAN OF CARE: Admit for overnight observation  PATIENT DISPOSITION:  PACU - hemodynamically stable.   Delay start of Pharmacological VTE agent (>24hrs) due to surgical blood loss or risk of bleeding: no

## 2012-10-07 NOTE — Anesthesia Postprocedure Evaluation (Signed)
  Anesthesia Post-op Note  Anesthesia Post Note  Patient: Margrett Rud  Procedure(s) Performed: Procedure(s) (LRB): ROBOTIC ASSISTED TOTAL HYSTERECTOMY (N/A) LAPAROSCOPIC BILATERAL SALPINGECTOMY (Bilateral) ROBOTIC ASSISTED LAPAROSCOPIC LYSIS OF ADHESION (N/A)  Anesthesia type: General  Patient location: PACU  Post pain: Pain level controlled  Post assessment: Post-op Vital signs reviewed  Last Vitals:  Filed Vitals:   10/07/12 1144  BP:   Pulse: 71  Temp: 36.4 C  Resp: 21    Post vital signs: Reviewed  Level of consciousness: sedated  Complications: No apparent anesthesia complications

## 2012-10-08 ENCOUNTER — Encounter (HOSPITAL_COMMUNITY): Payer: Self-pay | Admitting: Obstetrics and Gynecology

## 2012-10-08 LAB — CBC
HCT: 31 % — ABNORMAL LOW (ref 36.0–46.0)
Hemoglobin: 9.9 g/dL — ABNORMAL LOW (ref 12.0–15.0)
MCH: 28.2 pg (ref 26.0–34.0)
MCHC: 31.9 g/dL (ref 30.0–36.0)
MCV: 88.3 fL (ref 78.0–100.0)
Platelets: 209 10*3/uL (ref 150–400)
RBC: 3.51 MIL/uL — ABNORMAL LOW (ref 3.87–5.11)
RDW: 13.2 % (ref 11.5–15.5)
WBC: 15.3 10*3/uL — ABNORMAL HIGH (ref 4.0–10.5)

## 2012-10-08 LAB — BASIC METABOLIC PANEL
BUN: 12 mg/dL (ref 6–23)
CO2: 24 mEq/L (ref 19–32)
Calcium: 9.4 mg/dL (ref 8.4–10.5)
Chloride: 103 mEq/L (ref 96–112)
Creatinine, Ser: 0.98 mg/dL (ref 0.50–1.10)
GFR calc Af Amer: 77 mL/min — ABNORMAL LOW (ref >90)
GFR calc non Af Amer: 66 mL/min — ABNORMAL LOW (ref >90)
Glucose, Bld: 132 mg/dL — ABNORMAL HIGH (ref 70–99)
Potassium: 4.4 mEq/L (ref 3.5–5.1)
Sodium: 135 mEq/L (ref 135–145)

## 2012-10-08 MED ORDER — OXYCODONE-ACETAMINOPHEN 5-325 MG PO TABS: 1.0000 | ORAL_TABLET | ORAL | Status: DC | PRN

## 2012-10-08 MED ORDER — HYDROMORPHONE HCL 2 MG PO TABS
4.0000 mg | ORAL_TABLET | ORAL | Status: DC | PRN
  Administered 2012-10-08: 4 mg via ORAL
  Filled 2012-10-08 (×2): qty 1

## 2012-10-08 MED ORDER — HYDROMORPHONE HCL 4 MG PO TABS: 4.0000 mg | ORAL_TABLET | ORAL | Status: DC | PRN

## 2012-10-08 MED ORDER — ONDANSETRON HCL 4 MG PO TABS: 4.0000 mg | ORAL_TABLET | Freq: Four times a day (QID) | ORAL | Status: DC | PRN

## 2012-10-08 NOTE — Progress Notes (Signed)
Pt teaching complete  Out in wheelchair  Incisions intact  No bleeding at site and no nausea  Tolerating dilaudid  Po well

## 2012-10-08 NOTE — Op Note (Signed)
[name], [name]                 ACCOUNT NO.:  [id number]  MEDICAL RECORD NO.:  [id number]  LOCATION:  9316                          FACILITY:  [facility]  PHYSICIAN:  Maxie Better, M.D.DATE OF BIRTH:  1961/03/26  DATE OF PROCEDURE:  10/07/2012 DATE OF DISCHARGE:                              OPERATIVE REPORT   PREOPERATIVE DIAGNOSES:  Menorrhagia, uterine fibroids.  PROCEDURES:  DaVinci robotic total hysterectomy, bilateral salpingectomy.  POSTOPERATIVE DIAGNOSES:  Menorrhagia, uterine fibroids.  ANESTHESIA:  General.  SURGEON:  Maxie Better, M.D.  ASSISTANT:  Marlinda Mike, C.N.M.  DESCRIPTION OF PROCEDURE:  Under adequate general anesthesia, the patient was placed in the dorsal lithotomy position.  She was positioned for robotic surgery.  Examination under anesthesia revealed anteverted bulky uterus.  No adnexal masses could be appreciated.  The patient was sterilely prepped and draped in usual fashion.  Indwelling Foley catheter with 3-way Foley was sterilely placed.  A weighted speculum was placed in vagina.  Cervix was parous.  A 0 Vicryl figure-of-eight suture was placed in the anterior  And posterior lip of the cervix.  The cervix sounded to 10 cm.  A medium size RUMI cup along with a #10 uterine manipulator was introduced into the uterine cavity without incident and tightly fitted around the cervix at the cervicovaginal junction.  The retractors were removed.  Attention was then turned to the abdomen. 0.25% Marcaine was injected.  Supraumbilical vertical incision was then made.  Veress needle was introduced and tested.  A 3 L of CO2 was insufflated.  Veress needle was removed.  A 12-mm disposable trocar with sleeve was introduced in the abdomen without incident.  A robotic camera port was placed.  The camera was placed through that port.  On inspection, the uterus was irregular, fibroid.  The patient was then placed in deep Trendelenburg.  Two additional robotic  sites 8 mm  were placed on the left. In the right lower quadrant, 5mm assistant port  was placed on the right and an 8-mm robotic port site was placed on the right as well.  The robot was docked to the patient's left side and #1 arm had monopolar scissors and #2 arm PK dissector  #3 arm had Prograsp.  I then went to the surgical console.  At the surgical console, there were omental adhesions adherent to the anterior abdominal wall which was lysed carefully.  The procedure was  Then started and identified both ureters bilaterally peristalsing.  The both fallopian tubes were ultimately grasped and the mesosalpinx was opened, was serially clamped, cauterized, and cut.  The retroperitoneal space bilaterally was opened. The utero-ovarian ligaments were bilaterally clamped, cauterized, and cut followed by the round ligaments suture also bilaterally clamped, cauterized, and cut, skeletonizing the anterior leaf of the broad ligament anteriorly and posteriorly was opened and the vesicouterine peritoneum was developed.  The bladder was then gently dissected off the lower uterine segment and over the core ring.  Uterine vessels were bilaterally skeletonized 1st on the left which was then cauterized but not cut.  On the right, there were vessels were identified, clamped, cauterized, and cut.  Once this was done, I went back to the  contralateral side.  Uterine vessels were re-grasped, cauterized, and cut.  The cervicovaginal junction was identified at that point, and the circumferential incision was made along the core ring at this upper part of the cervicovaginal junction, and carried around circumferentially. Once this was done, the uterus with both tubes were brought through the vagina.  The vaginal insufflator was reinserted.  The vaginal cuff had some bleeding which was cauterized and the bladder dissected further off of the vaginal cuff.  The PK dissector was replaced with a long-tip forceps and  monopolar scissors placed by a large suture needle driver. a 0 V-Loc suture was then placed, which was subsequently used to close the vaginal cuff in a running stitch, a 2nd V-loc suture one was placed to complete the closure.  Once this was done, the needles were docked to the patient's left.  The abdomen was irrigated and suctioned.  The pedicles were incised and small bleeders cauterized.  The procedure was completed at that point, at which time, the robotic instruments were removed.  The robot was undocked.  I went back to the patient's bedside sterilely. Surgicel was placed in the right lateral wall, but otherwise looks well with good hemostasis.  The abdomen was irrigated and suctioned.  Small bleeders had been cauterized.  The liver edge was noted to be normal.  Appendix was not seen initially and the robotic port sites were then taken out under direct visualization.  The abdomen was deflated and the vaginal cuff was manually palpated.  Good approximation noted.  The fascia was identified in the supraumbilical site and closed with 0 Vicryl figure-of- eight sutures and the skin incisions were approximated with 4-0 Vicryl subcuticular stitches.  SPECIMENS:  Uterus with cervix,tubes were sent to Pathology.  ESTIMATED BLOOD LOSS:  100 mL.  INTRAOPERATIVE FLUID:  1500 mL.  URINE OUTPUT:  200 mL.  Sponge and instrument counts x2 was correct.  COMPLICATION:  None.  The patient tolerated the procedure well, was transferred to recovery in stable condition.     Maxie Better, M.D.     [place]/MED[facility]  D:  10/07/2012  T:  10/08/2012  Job:  [id number]

## 2012-10-08 NOTE — Progress Notes (Signed)
Subjective: Patient reports nausea and no problems voiding.   Pt notes nausea and lightheadedness . Had 2 percocet for pain 3 hrs apart. Ate a little bit( notes hunger)  Objective: I have reviewed patient's vital signs.  vital signs, intake and output, medications and labs. Filed Vitals:   10/08/12 0539  BP: 120/62  Pulse: 83  Temp: 98.1 F (36.7 C)  Resp: 18   I/O last 3 completed shifts: In: 1700 [I.V.:1700] Out: 1570 [Urine:1170; Emesis/NG output:300; Blood:100] Total I/O In: 1465.8 [I.V.:1465.8] Out: 600 [Urine:600]  Lab Results  Component Value Date   WBC 4.0 10/03/2012   HGB 10.9* 10/03/2012   HCT 34.0* 10/03/2012   MCV 88.1 10/03/2012   PLT 216 10/03/2012   Lab Results  Component Value Date   CREATININE 0.89 10/03/2012   CBC    Component Value Date/Time   WBC 15.3* 10/08/2012 0545   RBC 3.51* 10/08/2012 0545   HGB 9.9* 10/08/2012 0545   HCT 31.0* 10/08/2012 0545   PLT 209 10/08/2012 0545   MCV 88.3 10/08/2012 0545   MCH 28.2 10/08/2012 0545   MCHC 31.9 10/08/2012 0545   RDW 13.2 10/08/2012 0545    BMET    Component Value Date/Time   NA 135 10/08/2012 0545   K 4.4 10/08/2012 0545   CL 103 10/08/2012 0545   CO2 24 10/08/2012 0545   GLUCOSE 132* 10/08/2012 0545   BUN 12 10/08/2012 0545   CREATININE 0.98 10/08/2012 0545   CALCIUM 9.4 10/08/2012 0545   GFRNONAA 66* 10/08/2012 0545   GFRAA 77* 10/08/2012 0545     EXAM General: alert, cooperative and no distress Resp: clear to auscultation bilaterally Cardio: regular rate and rhythm, S1, S2 normal, no murmur, click, rub or gallop GI: soft, non-tender; bowel sounds normal; no masses,  no organomegaly and incision: clean, dry and intact Extremities: no edema, redness or tenderness in the calves or thighs Vaginal Bleeding: minimal  Assessment: s/p Procedure(s): DAVINCI ROBOTIC ASSISTED TOTAL HYSTERECTOMY, BILATERAL SALPINGECTOMY ROBOTIC ASSISTED LYSIS OF ADHESION: stable and progressing well  Plan: suspect sx may be related to percocet.  Will change to dilaudid and make sure eating fine prior to d/c Advance diet Encourage ambulation Advance to PO medication Discharge home  LOS: 1 day    Elisandra Deshmukh A, MD 10/08/2012 5:57 AM    10/08/2012, 5:57 AM

## 2012-10-08 NOTE — Discharge Summary (Signed)
Physician Discharge Summary  Patient ID: [name] MRN: [id number] DOB/AGE: [birth year] 52 y.o.  Admit date: 10/07/2012 Discharge date: 10/08/2012  Admission Diagnoses: menorrhagia, uterine fibroid  Discharge Diagnoses: menorrhagia, uterine fibroids, abdominal wall adhesion  Principal Problem:   S/P hysterectomy - Davinci  Active Problems:   Menorrhagia with fibroids   Discharged Condition: stable  Hospital Course: Pt was  Underwent davinci robotic total hysterectomy, bilateral salpingectomy LOA. Post op course notable for mild nausea which responded to med  Consults: None  Significant Diagnostic Studies: labs:  CBC    Component Value Date/Time   WBC 15.3* 10/08/2012 0545   RBC 3.51* 10/08/2012 0545   HGB 9.9* 10/08/2012 0545   HCT 31.0* 10/08/2012 0545   PLT 209 10/08/2012 0545   MCV 88.3 10/08/2012 0545   MCH 28.2 10/08/2012 0545   MCHC 31.9 10/08/2012 0545   RDW 13.2 10/08/2012 0545    BMET    Component Value Date/Time   NA 139 10/03/2012 1122   K 4.1 10/03/2012 1122   CL 105 10/03/2012 1122   CO2 25 10/03/2012 1122   GLUCOSE 99 10/03/2012 1122   BUN 11 10/03/2012 1122   CREATININE 0.89 10/03/2012 1122   CALCIUM 9.3 10/03/2012 1122   GFRNONAA 74* 10/03/2012 1122   GFRAA 86* 10/03/2012 1122      Treatments: surgery: DaVinci robotic total hysterectomy, bilateral salpingectomy. LOA  Discharge Exam: Blood pressure 120/62, pulse 83, temperature 98.1 F (36.7 C), temperature source Oral, resp. rate 18, height 5\' 7"  (1.702 m), weight 115.214 kg (254 lb), SpO2 100.00%. General appearance: alert, cooperative and no distress Back: negative, no tenderness to percussion or palpation Resp: clear to auscultation bilaterally Cardio: regular rate and rhythm, S1, S2 normal, no murmur, click, rub or gallop GI: soft, non-tender; bowel sounds normal; no masses,  no organomegaly and ledt lateral incision had suture out reapproximated with dermabond Pelvic: deferred Extremities: no edema, redness or  tenderness in the calves or thighs Incision/Wound:no erythema/induration  Disposition: 01-Home or Self Care  Discharge Orders   Future Orders Complete By Expires     Diet - low sodium heart healthy  As directed     Discharge patient  As directed     May walk up steps  As directed     No wound care  As directed         Medication List    STOP taking these medications       ALEVE 220 MG tablet  Generic drug:  naproxen sodium      TAKE these medications       amLODipine 5 MG tablet  Commonly known as:  NORVASC  Take 5 mg by mouth daily.     oxyCODONE-acetaminophen 5-325 MG per tablet  Commonly known as:  PERCOCET/ROXICET  Take 1-2 tablets by mouth every 4 (four) hours as needed.      Dilaudid 4 mg po q 4 hrs prn #30 Zofran 4 mg po TID prn     Follow-up Information   Follow up with Adisson Deak A, MD In 2 weeks.   Contact information:   [address] [place] [address] [phone]       Signed: Ranika Mcniel A 10/08/2012, 6:00 AM

## 2013-06-23 DIAGNOSIS — I10 Essential (primary) hypertension: Secondary | ICD-10-CM | POA: Insufficient documentation

## 2013-08-08 DIAGNOSIS — J45909 Unspecified asthma, uncomplicated: Secondary | ICD-10-CM | POA: Insufficient documentation

## 2013-08-08 DIAGNOSIS — D649 Anemia, unspecified: Secondary | ICD-10-CM | POA: Insufficient documentation

## 2013-08-08 DIAGNOSIS — M79671 Pain in right foot: Secondary | ICD-10-CM | POA: Insufficient documentation

## 2013-08-08 DIAGNOSIS — M25562 Pain in left knee: Secondary | ICD-10-CM

## 2013-08-08 DIAGNOSIS — M25561 Pain in right knee: Secondary | ICD-10-CM | POA: Insufficient documentation

## 2014-02-06 DIAGNOSIS — E669 Obesity, unspecified: Secondary | ICD-10-CM | POA: Insufficient documentation

## 2014-02-06 DIAGNOSIS — Z6841 Body Mass Index (BMI) 40.0 and over, adult: Secondary | ICD-10-CM | POA: Insufficient documentation

## 2014-04-20 DIAGNOSIS — M1712 Unilateral primary osteoarthritis, left knee: Secondary | ICD-10-CM | POA: Insufficient documentation

## 2014-11-21 ENCOUNTER — Encounter (HOSPITAL_COMMUNITY): Payer: Self-pay | Admitting: Emergency Medicine

## 2014-11-21 ENCOUNTER — Emergency Department (HOSPITAL_COMMUNITY)
Admission: EM | Admit: 2014-11-21 | Discharge: 2014-11-21 | Disposition: A | Discharge status: Home / Self Care | Payer: BLUE CROSS/BLUE SHIELD | Attending: Emergency Medicine | Admitting: Emergency Medicine

## 2014-11-21 DIAGNOSIS — M1990 Unspecified osteoarthritis, unspecified site: Secondary | ICD-10-CM | POA: Diagnosis not present

## 2014-11-21 DIAGNOSIS — I10 Essential (primary) hypertension: Secondary | ICD-10-CM | POA: Diagnosis not present

## 2014-11-21 DIAGNOSIS — M79605 Pain in left leg: Secondary | ICD-10-CM | POA: Diagnosis present

## 2014-11-21 DIAGNOSIS — M25562 Pain in left knee: Secondary | ICD-10-CM | POA: Diagnosis not present

## 2014-11-21 DIAGNOSIS — Z79899 Other long term (current) drug therapy: Secondary | ICD-10-CM | POA: Diagnosis not present

## 2014-11-21 DIAGNOSIS — M25561 Pain in right knee: Secondary | ICD-10-CM | POA: Insufficient documentation

## 2014-11-21 MED ORDER — ONDANSETRON 4 MG PO TBDP
4.0000 mg | ORAL_TABLET | Freq: Once | ORAL | Status: AC
  Administered 2014-11-21: 4 mg via ORAL
  Filled 2014-11-21: qty 1

## 2014-11-21 MED ORDER — NAPROXEN 500 MG PO TABS: 500.0000 mg | ORAL_TABLET | Freq: Two times a day (BID) | ORAL | Status: DC

## 2014-11-21 MED ORDER — OXYCODONE-ACETAMINOPHEN 5-325 MG PO TABS
1.0000 | ORAL_TABLET | Freq: Once | ORAL | Status: AC
  Administered 2014-11-21: 1 via ORAL
  Filled 2014-11-21: qty 1

## 2014-11-21 NOTE — ED Provider Notes (Signed)
CSN: [id number]     Arrival date & time 11/21/14  1943 History   First MD Initiated Contact with Patient 11/21/14 1958     Chief Complaint  Patient presents with  . Leg Pain     (Consider location/radiation/quality/duration/timing/severity/associated sxs/prior Treatment) HPI [name] is a 54 y.o. female who comes in for evaluation of bilateral leg pain. 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 She reports gradual onset of knee and leg pain today that radiates into her bilateral hips. She reports helping her daughter move into college and moving furniture yesterday and today which is uncharacteristic for her and is a large increase in activity for her. She denies any fevers, chills, numbness or weakness, chest pain, shortness of breath, swelling or redness in her legs.  Past Medical History  Diagnosis Date  . Arthritis   . Hypertension    Past Surgical History  Procedure Laterality Date  . Carpal tunnel release    . Hernia repair    . Robotic assisted total hysterectomy N/A 10/07/2012    Procedure: ROBOTIC ASSISTED TOTAL HYSTERECTOMY;  Surgeon: Marvene Staff, MD;  Location: [facility];  Service: Gynecology;  Laterality: N/A;  . Laparoscopic bilateral salpingectomy Bilateral 10/07/2012    Procedure: LAPAROSCOPIC BILATERAL SALPINGECTOMY;  Surgeon: Marvene Staff, MD;  Location: [facility];  Service: Gynecology;  Laterality: Bilateral;  . Robotic assisted laparoscopic lysis of adhesion N/A 10/07/2012    Procedure: ROBOTIC ASSISTED LAPAROSCOPIC LYSIS OF ADHESION;  Surgeon: Marvene Staff, MD;  Location: [facility];  Service: Gynecology;  Laterality: N/A;   History reviewed. No pertinent family history. Social History  Substance Use Topics  . Smoking status: Never Smoker   . Smokeless tobacco: None  . Alcohol Use: No   OB History    No data available     Review of Systems A 10 point review of systems was completed  and was negative except for pertinent positives and negatives as mentioned in the history of present illness     Allergies  Aspirin; Codeine; Ibuprofen; and Penicillins  Home Medications   Prior to Admission medications   Medication Sig Start Date End Date Taking? Authorizing Provider  albuterol (VENTOLIN HFA) 108 (90 BASE) MCG/ACT inhaler Inhale 2 puffs into the lungs 4 (four) times daily as needed. Shortness of breath 02/06/14  Yes Historical Provider, MD  ibuprofen (ADVIL,MOTRIN) 200 MG tablet Take 800 mg by mouth every 6 (six) hours as needed for moderate pain.   Yes Historical Provider, MD  lisinopril (PRINIVIL,ZESTRIL) 30 MG tablet Take 1 tablet by mouth daily. 10/08/14  Yes Historical Provider, MD  meloxicam (MOBIC) 15 MG tablet Take 1 tablet by mouth daily. 11/11/14  Yes Historical Provider, MD  naproxen (NAPROSYN) 500 MG tablet Take 1 tablet (500 mg total) by mouth 2 (two) times daily. 11/21/14   Amair Shrout, PA-C   BP 108/66 mmHg  Pulse 68  Temp(Src) 98.2 F (36.8 C) (Oral)  Resp 18  SpO2 100%  LMP 10/01/2012 Physical Exam  Constitutional: She is oriented to person, place, and time. She appears well-developed and well-nourished.  HENT:  Head: Normocephalic and atraumatic.  Mouth/Throat: Oropharynx is clear and moist.  Eyes: Conjunctivae are normal. Pupils are equal, round, and reactive to light. Right eye exhibits no discharge. Left eye exhibits no discharge. No scleral icterus.  Neck: Neck supple.  Cardiovascular: Normal rate, regular rhythm and normal heart sounds.   Pulmonary/Chest: Effort normal and breath  sounds normal. No respiratory distress. She has no wheezes. She has no rales.  Abdominal: Soft. There is no tenderness.  Musculoskeletal: She exhibits no tenderness.  Neurological: She is alert and oriented to person, place, and time.  Cranial Nerves II-XII grossly intact  Skin: Skin is warm and dry. No rash noted.  Psychiatric: She has a normal mood and  affect.  Nursing note and vitals reviewed.   ED Course  Procedures (including critical care time) Labs Review Labs Reviewed - No data to display  Imaging Review No results found. I, Verl Dicker, personally reviewed and evaluated these images and lab results as part of my medical decision-making.   EKG Interpretation None     Meds given in ED:  Medications  oxyCODONE-acetaminophen (PERCOCET/ROXICET) 5-325 MG per tablet 1 tablet (1 tablet Oral Given 11/21/14 2128)  ondansetron (ZOFRAN-ODT) disintegrating tablet 4 mg (4 mg Oral Given 11/21/14 2127)    Discharge Medication List as of 11/21/2014 11:07 PM    START taking these medications   Details  naproxen (NAPROSYN) 500 MG tablet Take 1 tablet (500 mg total) by mouth 2 (two) times daily., Starting 11/21/2014, Until Discontinued, Print       Filed Vitals:   11/21/14 1953 11/21/14 2007 11/21/14 2314  BP:  149/83 108/66  Pulse:  91 68  Temp:   98.2 F (36.8 C)  TempSrc:   Oral  Resp:  20 18  SpO2: 98% 100% 100%    MDM  Vitals stable - WNL -afebrile Pt resting comfortably in ED. reports some relief with analgesia in the ED. PE--physical exam as above and not concerning.  DDX--patient with likely exacerbation of her chronic osteoarthritis with overuse injury. Will write for anti-inflammatory is at home. No evidence of other acute or emergent pathology at this time.  I discussed all relevant lab findings and imaging results with pt and they verbalized understanding. Discussed f/u with PCP within 48 hrs and return precautions, pt very amenable to plan.  Final diagnoses:  Bilateral knee pain       Comer Locket, PA-C 11/22/14 0001  Daleen Bo, MD 11/22/14 1257

## 2014-11-21 NOTE — Discharge Instructions (Signed)
Your evaluated in the ED today for your knee pain. There does not appear to be an emergent cause for your symptoms at this time. Please take anti-inflammatory's as prescribed. Do not take this in combination with Motrin or your meloxicam. Follow-up with your doctors as needed. Return to ED for worsening symptoms.  Arthralgia Your caregiver has diagnosed you as suffering from an arthralgia. Arthralgia means there is pain in a joint. This can come from many reasons including:  Bruising the joint which causes soreness (inflammation) in the joint.  Wear and tear on the joints which occur as we grow older (osteoarthritis).  Overusing the joint.  Various forms of arthritis.  Infections of the joint. Regardless of the cause of pain in your joint, most of these different pains respond to anti-inflammatory drugs and rest. The exception to this is when a joint is infected, and these cases are treated with antibiotics, if it is a bacterial infection. HOME CARE INSTRUCTIONS   Rest the injured area for as long as directed by your caregiver. Then slowly start using the joint as directed by your caregiver and as the pain allows. Crutches as directed may be useful if the ankles, knees or hips are involved. If the knee was splinted or casted, continue use and care as directed. If an stretchy or elastic wrapping bandage has been applied today, it should be removed and re-applied every 3 to 4 hours. It should not be applied tightly, but firmly enough to keep swelling down. Watch toes and feet for swelling, bluish discoloration, coldness, numbness or excessive pain. If any of these problems (symptoms) occur, remove the ace bandage and re-apply more loosely. If these symptoms persist, contact your caregiver or return to this location.  For the first 24 hours, keep the injured extremity elevated on pillows while lying down.  Apply ice for 15-20 minutes to the sore joint every couple hours while awake for the first  half day. Then 03-04 times per day for the first 48 hours. Put the ice in a plastic bag and place a towel between the bag of ice and your skin.  Wear any splinting, casting, elastic bandage applications, or slings as instructed.  Only take over-the-counter or prescription medicines for pain, discomfort, or fever as directed by your caregiver. Do not use aspirin immediately after the injury unless instructed by your physician. Aspirin can cause increased bleeding and bruising of the tissues.  If you were given crutches, continue to use them as instructed and do not resume weight bearing on the sore joint until instructed. Persistent pain and inability to use the sore joint as directed for more than 2 to 3 days are warning signs indicating that you should see a caregiver for a follow-up visit as soon as possible. Initially, a hairline fracture (break in bone) may not be evident on X-rays. Persistent pain and swelling indicate that further evaluation, non-weight bearing or use of the joint (use of crutches or slings as instructed), or further X-rays are indicated. X-rays may sometimes not show a small fracture until a week or 10 days later. Make a follow-up appointment with your own caregiver or one to whom we have referred you. A radiologist (specialist in reading X-rays) may read your X-rays. Make sure you know how you are to obtain your X-ray results. Do not assume everything is normal if you do not hear from [place]. SEEK MEDICAL CARE IF: Bruising, swelling, or pain increases. SEEK IMMEDIATE MEDICAL CARE IF:   Your fingers or  toes are numb or blue.  The pain is not responding to medications and continues to stay the same or get worse.  The pain in your joint becomes severe.  You develop a fever over 102 F (38.9 C).  It becomes impossible to move or use the joint. MAKE SURE YOU:   Understand these instructions.  Will watch your condition.  Will get help right away if you are not doing well or  get worse. Document Released: 03/27/2005 Document Revised: 06/19/2011 Document Reviewed: 11/13/2007 [facility] Patient Information 2015 [place], [place]. This information is not intended to replace advice given to you by your health care provider. Make sure you discuss any questions you have with your health care provider.

## 2014-11-21 NOTE — ED Notes (Signed)
Bed: [facility] Expected date:  Expected time:  Means of arrival:  Comments: EMS 54 yo female chronic hip and knee pain

## 2014-11-21 NOTE — ED Notes (Signed)
Pt arrived via EMS with a complaint of bilatral leg and knee pain.  Pt states that she has had osteoarthritis issues for three years.  Pt stopped going to ortho at beginning of the year.  Pt states that she over did work today and ended up lying on the floor and calling EMS.  Pt was hyperventalating and having chest pain when EMS arrived.  EMS coached toa normal breathing rate in which her pain subsided.  Pt states that her pain is located in bilateral knees and lower legs.  Pt also has asthma.  Pt wants pain relief.

## 2015-04-02 ENCOUNTER — Ambulatory Visit: Payer: BC Managed Care – PPO | Attending: Orthopedic Surgery | Admitting: Physical Therapy

## 2015-04-02 DIAGNOSIS — M6281 Muscle weakness (generalized): Secondary | ICD-10-CM | POA: Diagnosis present

## 2015-04-02 DIAGNOSIS — Z96652 Presence of left artificial knee joint: Secondary | ICD-10-CM | POA: Diagnosis present

## 2015-04-02 DIAGNOSIS — R269 Unspecified abnormalities of gait and mobility: Secondary | ICD-10-CM

## 2015-04-02 DIAGNOSIS — M25662 Stiffness of left knee, not elsewhere classified: Secondary | ICD-10-CM | POA: Diagnosis present

## 2015-04-02 NOTE — Patient Instructions (Signed)
   Copyright  VHI. All rights reserved.  HIP: Flexion / KNEE: Extension, Straight Leg Raise   Raise leg, keeping knee straight. Perform slowly. _10__ reps per set, __2_ sets per day, __7_ days per week   Copyright  VHI. All rights reserved.  Heel Slide   Bend knee and pull heel toward buttocks. Hold _5__ seconds. Return. Repeat with other knee. Repeat ___10_ times. Do ___3_ sessions per day.  http://gt2.exer.us/372   Copyright  VHI. All rights reserved.     Raise leg until knee is straight. __10_ reps per set, __3_ sets per day, _7__ days per week  Copyright  VHI. All rights reserved.  Short Arc [employer] a large can or rolled towel under leg. Straighten knee and leg. Hold _5___ seconds. Repeat with other leg. Repeat ____10 times. Do __3__ sessions per day.  http://gt2.exer.us/365   Copyright  VHI. All rights reserved.  Quad Set   Slowly tighten muscles on thigh of straight leg while counting out loud to _5___. Repeat with other leg. Repeat _10___ times. Do __3__ sessions per day.  http://gt2.exer.us/361   Copyright  VHI. All rights reserved.

## 2015-04-02 NOTE — Therapy (Signed)
[place], [place], [address] Phone: [phone]   Fax:  [phone]  Physical Therapy Evaluation  Patient Details  Name: [name] MRN: [id number] Date of Birth: [birth year] Referring Provider: Dr. Eden Lathe  Encounter Date: 04/02/2015      PT End of Session - 04/02/15 1125    Visit Number 1   Number of Visits 16   Date for PT Re-Evaluation 05/28/15   Authorization Type BCBS   PT Start Time 1025   PT Stop Time 1105   PT Time Calculation (min) 40 min   Activity Tolerance Patient tolerated treatment well      Past Medical History  Diagnosis Date  . Arthritis   . Hypertension     Past Surgical History  Procedure Laterality Date  . Carpal tunnel release    . Hernia repair    . Robotic assisted total hysterectomy N/A 10/07/2012    Procedure: ROBOTIC ASSISTED TOTAL HYSTERECTOMY;  Surgeon: Marvene Staff, MD;  Location: [facility];  Service: Gynecology;  Laterality: N/A;  . Laparoscopic bilateral salpingectomy Bilateral 10/07/2012    Procedure: LAPAROSCOPIC BILATERAL SALPINGECTOMY;  Surgeon: Marvene Staff, MD;  Location: [facility];  Service: Gynecology;  Laterality: Bilateral;  . Robotic assisted laparoscopic lysis of adhesion N/A 10/07/2012    Procedure: ROBOTIC ASSISTED LAPAROSCOPIC LYSIS OF ADHESION;  Surgeon: Marvene Staff, MD;  Location: [facility];  Service: Gynecology;  Laterality: N/A;    There were no vitals filed for this visit.  Visit Diagnosis:  Status post left partial knee replacement - Plan: PT plan of care cert/re-cert  Knee stiff, left - Plan: PT plan of care cert/re-cert  Muscle weakness of lower extremity - Plan: PT plan of care cert/re-cert  Abnormality of gait - Plan: PT plan of care cert/re-cert      Subjective Assessment - 04/02/15 1036    Subjective Patient presents s/p 11/29/14 left partial knee replacement by Dr. Eden Lathe at [facility].  She was discharged  yesterday afternoon after practicing going up and down steps.   She is WBAT.   She is using a RW.  She states she already feels better since surgery.   Pertinent History High BP, asthma   Limitations Walking;Standing   How long can you walk comfortably? household and into the clinic today   Diagnostic tests xray   Patient Stated Goals get back to exercising again   Currently in Pain? Yes   Pain Score 6    Pain Location Knee   Pain Orientation Left   Pain Type Surgical pain   Pain Frequency Constant   Aggravating Factors  walking   Pain Relieving Factors ice pack            [facility] PT Assessment - 04/02/15 1042    Assessment   Medical Diagnosis left partial knee replacement    Referring Provider Dr. Eden Lathe   Onset Date/Surgical Date 03/31/15   Hand Dominance Right   Next MD Visit Jan 4   Prior Therapy In hospital   Precautions   Precautions None   Restrictions   Weight Bearing Restrictions No   Balance Screen   Has the patient fallen in the past 6 months Yes   How many times? 1x  last Thursday    Has the patient had a decrease in activity level because of a fear of falling?  No   Is the patient reluctant to leave their home because of a fear of falling?  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  stoop to strap in    Leisure go to ball games, church   Observation/Other Assessments   Focus on Therapeutic Outcomes (FOTO)  55% limitation   Posture/Postural Control   Posture Comments dressing over left knee stays on until next Thurs  moderate swelling   ROM / Strength    AROM / PROM / Strength AROM;Strength   AROM   AROM Assessment Site Knee   Right/Left Knee Right;Left   Right Knee Extension 0   Right Knee Flexion 130   Left Knee Extension -25   Left Knee Flexion 82   Strength   Overall Strength Comments Min quad set; max assist with SLR   Strength Assessment Site Knee   Right/Left Knee Right;Left   Right Knee Flexion 5/5   Right Knee Extension 5/5   Left Knee Flexion 2-/5   Left Knee Extension 2-/5   Ambulation/Gait   Ambulation Distance (Feet) 60 Feet   Assistive device Rolling walker   Gait Pattern Decreased stance time - left                           PT Education - 04/02/15 1125    Education provided Yes   Education Details Knee level 1 with husbands assist   Person(s) Educated Patient;Spouse   Methods Explanation;Demonstration;Handout   Comprehension Verbalized understanding;Returned demonstration          PT Short Term Goals - 04/02/15 1136    PT SHORT TERM GOAL #1   Title Left knee flexion improved to 95 degrees needed for mobility with sit to stand  04/30/15   Time 4   Period Weeks   Status New   PT SHORT TERM GOAL #2   Title Knee extension to 10 degrees needed ambulation with minimal limp   Time 4   Period Weeks   Status New   PT SHORT TERM GOAL #3   Title Patient will have LE motor control improved to 4-/5 needed for gait with a cane household distances and short community distances   Time 4   Period Weeks   Status New           PT Long Term Goals - 04/02/15 1140    PT LONG TERM GOAL #1   Title The patient will be independent with safe self progression of HEP for further improvements in ROM and strength  05/28/15   Time 8   Period Weeks   Status New   PT LONG TERM GOAL #2   Title The patient will have improved left knee flexion to 112 degrees needed for going up and down stairs to get to bedroom   Time 8   Period Weeks   Status New   PT LONG TERM GOAL #3   Title The patient will have  improved knee extension to 5 degrees needed for ambulation with minimal limp community distances and return to work   Time 8   Period Weeks   Status New  PT LONG TERM GOAL #4   Title Gait speed > .6m/sec needed for safe community ambulation with or without the cane   Time 8   Period Weeks   Status New   PT LONG TERM GOAL #5   Title FOTO functional outcome score improved to 42% indicating improved function with less pain   Time 8   Period Weeks   Status New               Plan - 04/02/15 1126    Clinical Impression Statement The patient presents 2 days s/p left partial (uni-compartmental) knee replacement 03/31/15.  She presents using RW (WBAT).  Her home is level entry but her bedroom is upstairs which she has been able to negotiate with 1 railing and step to pattern.  She has a dressing over incision but moderate edema present.  Poor quad activation with quad set.  Max assist needed with SLR.  Left knee AROM in sitting 25-82 degrees.  Antalgic and decreased gait speed.  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  She would benefit form PT to address these deficits.     Pt will benefit from skilled therapeutic intervention in order to improve on the following deficits Abnormal gait;Decreased activity tolerance;Decreased mobility;Decreased range of motion;Decreased strength;Increased edema;Pain   Rehab Potential Good   PT Frequency 2x / week   PT Duration 8 weeks   PT Treatment/Interventions ADLs/Self Care Home Management;Cryotherapy;Advice worker;Therapeutic exercise;Stair training;Neuromuscular re-education;Patient/family education;Manual techniques;Scar mobilization;Taping;Vasopneumatic Device   PT Next Visit Plan recheck motor control with Knee level 1 HEP;  low level movements on Nu-Step; quad setting; vasocompression   PT Home Exercise Plan Knee level 1 with assist from husband   Consulted and Agree with Plan of Care Patient          Problem List Patient Active Problem List   Diagnosis Date Noted  . S/P hysterectomy - [facility]  10/07/2012  . Menorrhagia with fibroids 10/07/2012    [name] 04/02/2015, 11:46 AM  [facility] [address] [place], [place], [address] Phone: [phone]   Fax:  [phone]  Name: [name] MRN: [id number] Date of Birth: 1960/08/25   Ruben Im, PT 04/02/2015 11:46 AM Phone: [phone] Fax: [phone]

## 2015-04-06 ENCOUNTER — Ambulatory Visit: Payer: BC Managed Care – PPO | Admitting: Physical Therapy

## 2015-04-06 DIAGNOSIS — M6281 Muscle weakness (generalized): Secondary | ICD-10-CM

## 2015-04-06 DIAGNOSIS — Z96652 Presence of left artificial knee joint: Secondary | ICD-10-CM

## 2015-04-06 DIAGNOSIS — M25662 Stiffness of left knee, not elsewhere classified: Secondary | ICD-10-CM

## 2015-04-06 DIAGNOSIS — R269 Unspecified abnormalities of gait and mobility: Secondary | ICD-10-CM

## 2015-04-06 NOTE — Therapy (Signed)
[place], [place], [address] Phone: [phone]   Fax:  [phone]  Physical Therapy Treatment  Patient Details  Name: [name] MRN: [id number] Date of Birth: [birth year] Referring Provider: Dr. Eden Lathe  Encounter Date: 04/06/2015      PT End of Session - 04/06/15 1700    Visit Number 2   Number of Visits 16   Date for PT Re-Evaluation 05/28/15   Authorization Type BCBS   PT Start Time 1630   PT Stop Time 1730   PT Time Calculation (min) 60 min   Activity Tolerance Patient tolerated treatment well;Patient limited by pain      Past Medical History  Diagnosis Date  . Arthritis   . Hypertension     Past Surgical History  Procedure Laterality Date  . Carpal tunnel release    . Hernia repair    . Robotic assisted total hysterectomy N/A 10/07/2012    Procedure: ROBOTIC ASSISTED TOTAL HYSTERECTOMY;  Surgeon: Marvene Staff, MD;  Location: [facility];  Service: Gynecology;  Laterality: N/A;  . Laparoscopic bilateral salpingectomy Bilateral 10/07/2012    Procedure: LAPAROSCOPIC BILATERAL SALPINGECTOMY;  Surgeon: Marvene Staff, MD;  Location: [facility];  Service: Gynecology;  Laterality: Bilateral;  . Robotic assisted laparoscopic lysis of adhesion N/A 10/07/2012    Procedure: ROBOTIC ASSISTED LAPAROSCOPIC LYSIS OF ADHESION;  Surgeon: Marvene Staff, MD;  Location: [facility];  Service: Gynecology;  Laterality: N/A;    There were no vitals filed for this visit.  Visit Diagnosis:  Status post left partial knee replacement  Knee stiff, left  Muscle weakness of lower extremity  Abnormality of gait      Subjective Assessment - 04/06/15 1631    Subjective Reports she is still feeling stiff but improving.   Currently in Pain? Yes   Pain Score 6    Pain Location Knee   Pain Orientation Left                         [facility] Adult PT Treatment/Exercise - 04/06/15 0001    Knee/Hip Exercises: Aerobic   Nustep L1 5 min   Knee/Hip Exercises: Seated   Ball Squeeze 10   Clamshell with TheraBand Red  10x   Other Seated Knee/Hip Exercises Fitter 1 blue cord knee extension 25x   Knee/Hip Exercises: Supine   Quad Sets AROM;Left;10 reps   Short Arc [employer];Left;1 set;10 reps   Straight Leg Raises AAROM;Left;1 set;10 reps   Other Supine Knee/Hip Exercises ball roll for flexion AAROM 10x   Other Supine Knee/Hip Exercises HS sets on ball 4x   Vasopneumatic   Number Minutes Vasopneumatic  15 minutes   Vasopnuematic Location  Knee   Vasopneumatic Pressure Low   Vasopneumatic Temperature  32                  PT Short Term Goals - 04/06/15 1701    PT SHORT TERM GOAL #1   Title Left knee flexion improved to 95 degrees needed for mobility with sit to stand  04/30/15   Time 4   Period Weeks   Status On-going   PT SHORT TERM GOAL #2   Title Knee extension to 10 degrees needed ambulation with minimal limp   Time 4   Period Weeks   Status On-going   PT SHORT TERM GOAL #3   Title Patient will have LE motor control improved to 4-/5 needed for  gait with a cane household distances and short community distances   Time 4   Period Weeks   Status On-going           PT Long Term Goals - 04/06/15 1701    PT LONG TERM GOAL #1   Title The patient will be independent with safe self progression of HEP for further improvements in ROM and strength  05/28/15   Time 8   Period Weeks   Status On-going   PT LONG TERM GOAL #2   Title The patient will have improved left knee flexion to 112 degrees needed for going up and down stairs to get to bedroom   Time 8   Period Weeks   Status On-going   PT LONG TERM GOAL #3   Title The patient will have improved knee extension to 5 degrees needed for ambulation with minimal limp community distances and return to work   Time 8   Period Weeks   Status On-going   PT LONG TERM GOAL #4   Title Gait speed > .64m/sec  needed for safe community ambulation with or without the cane   Time 8   Period Weeks   Status On-going   PT LONG TERM GOAL #5   Title FOTO functional outcome score improved to 42% indicating improved function with less pain   Time 8   Period Weeks   Status On-going               Plan - 04/06/15 1702    Clinical Impression Statement The patient returns 6 days s/p left partial knee replacement.  She continues to have poor activation of quads.  Needs 90% assist with SLR and SAQ.  She reports she feels better post treatment session with decreased stiffness and pain.  Good pain relief and decreased edema following vasocompression.   PT Next Visit Plan recheck motor control with Knee level 1 HEP;  low level movements on Nu-Step; quad setting; vasocompression;  seated Fitter 1 blue cord        Problem List Patient Active Problem List   Diagnosis Date Noted  . S/P hysterectomy - [facility]  10/07/2012  . Menorrhagia with fibroids 10/07/2012    [name] 04/06/2015, 5:14 PM  [facility] [address] [place], [place], [address] Phone: [phone]   Fax:  [phone]  Name: [name] MRN: [id number] Date of Birth: [birth year]

## 2015-04-08 ENCOUNTER — Ambulatory Visit: Payer: BC Managed Care – PPO | Admitting: Physical Therapy

## 2015-04-08 DIAGNOSIS — R269 Unspecified abnormalities of gait and mobility: Secondary | ICD-10-CM

## 2015-04-08 DIAGNOSIS — Z96652 Presence of left artificial knee joint: Secondary | ICD-10-CM

## 2015-04-08 DIAGNOSIS — M25662 Stiffness of left knee, not elsewhere classified: Secondary | ICD-10-CM

## 2015-04-08 DIAGNOSIS — M6281 Muscle weakness (generalized): Secondary | ICD-10-CM

## 2015-04-08 NOTE — Therapy (Signed)
[place], [place], [address] Phone: [phone]   Fax:  [phone]  Physical Therapy Treatment  Patient Details  Name: [name] MRN: [id number] Date of Birth: Feb 14, 1961 Referring Provider: Dr. Eden Lathe  Encounter Date: 04/08/2015      PT End of Session - 04/08/15 1008    Visit Number 3   Number of Visits 16   Date for PT Re-Evaluation 05/28/15   Authorization Type BCBS   PT Start Time 0935   PT Stop Time 1030   PT Time Calculation (min) 55 min   Activity Tolerance Patient tolerated treatment well      Past Medical History  Diagnosis Date  . Arthritis   . Hypertension     Past Surgical History  Procedure Laterality Date  . Carpal tunnel release    . Hernia repair    . Robotic assisted total hysterectomy N/A 10/07/2012    Procedure: ROBOTIC ASSISTED TOTAL HYSTERECTOMY;  Surgeon: Marvene Staff, MD;  Location: [facility];  Service: Gynecology;  Laterality: N/A;  . Laparoscopic bilateral salpingectomy Bilateral 10/07/2012    Procedure: LAPAROSCOPIC BILATERAL SALPINGECTOMY;  Surgeon: Marvene Staff, MD;  Location: [facility];  Service: Gynecology;  Laterality: Bilateral;  . Robotic assisted laparoscopic lysis of adhesion N/A 10/07/2012    Procedure: ROBOTIC ASSISTED LAPAROSCOPIC LYSIS OF ADHESION;  Surgeon: Marvene Staff, MD;  Location: [facility];  Service: Gynecology;  Laterality: N/A;    There were no vitals filed for this visit.  Visit Diagnosis:  Status post left partial knee replacement  Knee stiff, left  Muscle weakness of lower extremity  Abnormality of gait      Subjective Assessment - 04/08/15 0955    Subjective It is feeling really stiff today. I think I overdid it yesterday.   Currently in Pain? Yes   Pain Score 5    Pain Location Knee   Pain Orientation Left   Pain Onset 1 to 4 weeks ago   Pain Frequency Constant                         [facility]  Adult PT Treatment/Exercise - 04/08/15 0957    Knee/Hip Exercises: Aerobic   Nustep L1 5 min   Knee/Hip Exercises: Standing   Other Standing Knee Exercises UE wall reach with quad set/glut set 10x   Knee/Hip Exercises: Seated   Long Arc Quad AROM;Strengthening;Left;1 set;10 reps   Other Seated Knee/Hip Exercises Fitter 1 blue cord knee extension 25x  flexion 20x   Sit to Sand 1 set;10 reps  high mat table   Knee/Hip Exercises: Supine   Quad Sets AROM;Left;10 reps   Short Arc [employer];Left;1 set;10 reps   Straight Leg Raises AAROM;Left;1 set;10 reps   Other Supine Knee/Hip Exercises ball roll for flexion AAROM 10x   Other Supine Knee/Hip Exercises HS sets on ball 8x   Vasopneumatic   Number Minutes Vasopneumatic  15 minutes   Vasopnuematic Location  Knee   Vasopneumatic Pressure Low   Vasopneumatic Temperature  32                  PT Short Term Goals - 04/08/15 1012    PT SHORT TERM GOAL #1   Title Left knee flexion improved to 95 degrees needed for mobility with sit to stand  04/30/15   Time 4   Period Weeks   Status On-going   PT SHORT TERM GOAL #2  Title Knee extension to 10 degrees needed ambulation with minimal limp   Time 4   Period Weeks   Status On-going   PT SHORT TERM GOAL #3   Title Patient will have LE motor control improved to 4-/5 needed for gait with a cane household distances and short community distances   Time 4   Period Weeks   Status On-going           PT Long Term Goals - 04/08/15 1012    PT LONG TERM GOAL #1   Title The patient will be independent with safe self progression of HEP for further improvements in ROM and strength  05/28/15   Time 8   Period Weeks   Status On-going   PT LONG TERM GOAL #2   Title The patient will have improved left knee flexion to 112 degrees needed for going up and down stairs to get to bedroom   Time 8   Period Weeks   Status On-going   PT LONG TERM GOAL #3   Title The patient will have  improved knee extension to 5 degrees needed for ambulation with minimal limp community distances and return to work   Time 8   Period Weeks   Status On-going   PT LONG TERM GOAL #4   Title Gait speed > .31m/sec needed for safe community ambulation with or without the cane   Time 8   Period Weeks   Status On-going   PT LONG TERM GOAL #5   Title FOTO functional outcome score improved to 42% indicating improved function with less pain   Time 8   Period Weeks   Status On-going               Plan - 04/08/15 1009    Clinical Impression Statement The patient continues to have limited knee ROM passively and actively in flexion more than extension.  Poor quad activation and needs 80% assist with SLR.  Able to do SAQ and LAQ but lacks quite a bit of terminal knee extension.  She reports her knee feels much less stiff post treatment session.     PT Next Visit Plan  measure AROM;  recheck motor control with Knee level 1 HEP;  Increase time on Nu-Step; quad setting; vasocompression;  seated Fitter 1 black cord        Problem List Patient Active Problem List   Diagnosis Date Noted  . S/P hysterectomy - [facility]  10/07/2012  . Menorrhagia with fibroids 10/07/2012    [name] 04/08/2015, 5:54 PM  [facility] [address] [place], [place], [address] Phone: [phone]   Fax:  [phone]  Name: [name] MRN: [id number] Date of Birth: 1961-04-04

## 2015-04-14 ENCOUNTER — Ambulatory Visit: Payer: BC Managed Care – PPO | Attending: Orthopedic Surgery | Admitting: Physical Therapy

## 2015-04-14 DIAGNOSIS — R269 Unspecified abnormalities of gait and mobility: Secondary | ICD-10-CM | POA: Diagnosis present

## 2015-04-14 DIAGNOSIS — M25662 Stiffness of left knee, not elsewhere classified: Secondary | ICD-10-CM | POA: Diagnosis present

## 2015-04-14 DIAGNOSIS — Z96652 Presence of left artificial knee joint: Secondary | ICD-10-CM | POA: Insufficient documentation

## 2015-04-14 DIAGNOSIS — M6281 Muscle weakness (generalized): Secondary | ICD-10-CM | POA: Diagnosis present

## 2015-04-14 NOTE — Therapy (Signed)
[place], [place], [address] Phone: [phone]   Fax:  [phone]  Physical Therapy Treatment  Patient Details  Name: [name] MRN: [id number] Date of Birth: July 25, 1960 Referring Provider: Dr. Eden Lathe  Encounter Date: 04/14/2015      PT End of Session - 04/14/15 1800    Visit Number 4   Number of Visits 16   Date for PT Re-Evaluation 05/28/15   PT Start Time 6734   PT Stop Time 1517   PT Time Calculation (min) 49 min   Activity Tolerance Patient tolerated treatment well   Behavior During Therapy [facility] for tasks assessed/performed      Past Medical History  Diagnosis Date  . Arthritis   . Hypertension     Past Surgical History  Procedure Laterality Date  . Carpal tunnel release    . Hernia repair    . Robotic assisted total hysterectomy N/A 10/07/2012    Procedure: ROBOTIC ASSISTED TOTAL HYSTERECTOMY;  Surgeon: Marvene Staff, MD;  Location: [facility];  Service: Gynecology;  Laterality: N/A;  . Laparoscopic bilateral salpingectomy Bilateral 10/07/2012    Procedure: LAPAROSCOPIC BILATERAL SALPINGECTOMY;  Surgeon: Marvene Staff, MD;  Location: [facility];  Service: Gynecology;  Laterality: Bilateral;  . Robotic assisted laparoscopic lysis of adhesion N/A 10/07/2012    Procedure: ROBOTIC ASSISTED LAPAROSCOPIC LYSIS OF ADHESION;  Surgeon: Marvene Staff, MD;  Location: [facility];  Service: Gynecology;  Laterality: N/A;    There were no vitals filed for this visit.  Visit Diagnosis:  Status post left partial knee replacement  Knee stiff, left  Muscle weakness of lower extremity  Abnormality of gait      Subjective Assessment - 04/14/15 1433    Subjective 5/10   Did not premedicate for pain she is trying to wean from medications.  Exercises 15 minutes a day.  Had staples removed.  See's MD Feb 1st   Currently in Pain? Yes   Pain Score 5    Pain Location Knee   Pain Orientation  Left   Pain Descriptors / Indicators Aching   Pain Frequency Constant   Pain Relieving Factors stretching, exercise                         [facility] Adult PT Treatment/Exercise - 04/14/15 0001    Knee/Hip Exercises: Stretches   Other Knee/Hip Stretches walker stretch 2 sets of 10 with last stretch adding DF and 15 second hold.     Knee/Hip Exercises: Seated   Long Arc Quad 10 reps  smaller motions   Heel Slides 10 reps  towel   Knee/Hip Exercises: Supine   Quad Sets 10 reps  poor control   Short Arc Quad Sets 10 reps;2 sets   Short Arc Quad Sets Limitations 3 LBS   Heel Slides 10 reps  AA, strap   Vasopneumatic   Number Minutes Vasopneumatic  15 minutes   Vasopnuematic Location  Knee   Vasopneumatic Pressure Medium   Vasopneumatic Temperature  32                PT Education - 04/14/15 1759    Education provided Yes   Education Details walker stretch   Person(s) Educated Patient;Spouse   Methods Explanation;Demonstration;Verbal cues   Comprehension Verbalized understanding;Returned demonstration          PT Short Term Goals - 04/14/15 1805    PT SHORT TERM GOAL #1   Title  Left knee flexion improved to 95 degrees needed for mobility with sit to stand  04/30/15   Time 4   Period Weeks   Status On-going   PT SHORT TERM GOAL #2   Title Knee extension to 10 degrees needed ambulation with minimal limp   Baseline full passive,   Time 4   Period Weeks   Status Partially Met   PT SHORT TERM GOAL #3   Title Patient will have LE motor control improved to 4-/5 needed for gait with a cane household distances and short community distances   Time 4   Period Weeks   Status On-going           PT Long Term Goals - 04/08/15 1012    PT LONG TERM GOAL #1   Title The patient will be independent with safe self progression of HEP for further improvements in ROM and strength  05/28/15   Time 8   Period Weeks   Status On-going   PT LONG TERM GOAL #2    Title The patient will have improved left knee flexion to 112 degrees needed for going up and down stairs to get to bedroom   Time 8   Period Weeks   Status On-going   PT LONG TERM GOAL #3   Title The patient will have improved knee extension to 5 degrees needed for ambulation with minimal limp community distances and return to work   Time 8   Period Weeks   Status On-going   PT LONG TERM GOAL #4   Title Gait speed > .38msec needed for safe community ambulation with or without the cane   Time 8   Period Weeks   Status On-going   PT LONG TERM GOAL #5   Title FOTO functional outcome score improved to 42% indicating improved function with less pain   Time 8   Period Weeks   Status On-going               Plan - 04/14/15 1802    Clinical Impression Statement Shortened session due to patient late. ROM improving with exercise.  Tho still limited with flexion.  She is not exercising much at home (15 minutes a day)   PT Next Visit Plan  measure AROM;  recheck motor control with Knee level 1 HEP;  Increase time on Nu-Step; quad setting; vasocompression;  seated Fitter 1 black cord   PT Home Exercise Plan Walker stretching,  Level 1 exercises to continue   Consulted and Agree with Plan of Care Patient        Problem List Patient Active Problem List   Diagnosis Date Noted  . S/P hysterectomy - Davinci  10/07/2012  . Menorrhagia with fibroids 10/07/2012    [facility], 6:06 PM  [place] [address]Phone: [phone]  Fax:  [phone] Name: Dawn FacemireMRN: [id number] of Birth: [birth year]   KMelvenia Needles PTA 04/14/2015 6:06 PM Phone: [phone]: [phone]

## 2015-04-15 ENCOUNTER — Encounter: Payer: Self-pay | Admitting: Physical Therapy

## 2015-04-16 ENCOUNTER — Ambulatory Visit: Payer: BC Managed Care – PPO | Admitting: Physical Therapy

## 2015-04-20 ENCOUNTER — Ambulatory Visit: Payer: BC Managed Care – PPO | Admitting: Physical Therapy

## 2015-04-20 DIAGNOSIS — R269 Unspecified abnormalities of gait and mobility: Secondary | ICD-10-CM

## 2015-04-20 DIAGNOSIS — Z96652 Presence of left artificial knee joint: Secondary | ICD-10-CM

## 2015-04-20 DIAGNOSIS — M6281 Muscle weakness (generalized): Secondary | ICD-10-CM

## 2015-04-20 DIAGNOSIS — M25662 Stiffness of left knee, not elsewhere classified: Secondary | ICD-10-CM

## 2015-04-20 NOTE — Patient Instructions (Signed)
   Sitting Scoot    Sit back in chair. Slide left foot back as far as you can then Scoot to the front of chair without moving your left foot.  Repeat __3__ times. Hold 20-30 seconds each.  Do __2__ sessions per day.  STAND AT [facility] FOR THE NEXT 3 EXERCISES.  Knee High   Holding stable object, raise knee to hip level, then lower knee. Repeat with other knee. Complete __10_ repetitions. Do __2__ sessions per day.  ABDUCTION: Standing (Active)   Stand, feet flat. Lift right leg out to side. Use _0__ lbs. Complete __10_ repetitions. Perform __2_ sessions per day.     EXTENSION: Standing (Active)  Stand, both feet flat. Draw right leg behind body as far as possible. Use 0___ lbs. Complete 10 repetitions. Perform __2_ sessions per day.

## 2015-04-20 NOTE — Therapy (Signed)
[facility] [address] [place], [place], [address] Phone: [phone]   Fax:  [phone]  Physical Therapy Treatment  Patient Details  Name: [name] MRN: [id number] Date of Birth: 03/09/1961 Referring Provider: Dr. Poehling  Encounter Date: 04/20/2015      PT End of Session - 04/20/15 1506    Visit Number 5   Number of Visits 16   Date for PT Re-Evaluation 05/28/15   Authorization Type BC[employer]   PT Start Time 0302   PT Stop Time 0400   PT Time Calculation (min) 58 min      Past Medical History  Diagnosis Date  . Arthritis   . Hypertension     Past Surgical History  Procedure Laterality Date  . Carpal tunnel release    . Hernia repair    . Robotic assisted total hysterectomy N/A 10/07/2012    Procedure: ROBOTIC ASSISTED TOTAL HYSTERECTOMY;  Surgeon: Sheronette A Cousins, MD;  Location: [facility];  Service: Gynecology;  Laterality: N/A;  . Laparoscopic bilateral salpingectomy Bilateral 10/07/2012    Procedure: LAPAROSCOPIC BILATERAL SALPINGECTOMY;  Surgeon: Sheronette A Cousins, MD;  Location: [facility];  Service: Gynecology;  Laterality: Bilateral;  . Robotic assisted laparoscopic lysis of adhesion N/A 10/07/2012    Procedure: ROBOTIC ASSISTED LAPAROSCOPIC LYSIS OF ADHESION;  Surgeon: Sheronette A Cousins, MD;  Location: [facility];  Service: Gynecology;  Laterality: N/A;    There were no vitals filed for this visit.  Visit Diagnosis:  Status post left partial knee replacement  Knee stiff, left  Muscle weakness of lower extremity  Abnormality of gait      Subjective Assessment - 04/20/15 1503    Subjective It feels alot better. I took myself off the pain medicine last week. The swelling went down in the leg and ankle.    Currently in Pain? Yes   Pain Score 4    Pain Location Knee   Pain Orientation Left   Pain Descriptors / Indicators Aching   Pain Frequency Intermittent   Aggravating Factors  walking too long,  sitting too long   Pain Relieving Factors The swelling going down            [facility] PT Assessment - 04/20/15 1528    AROM   Left Knee Flexion 95  AAROM   Strength   Left Knee Extension 3/5                     [facility] Adult PT Treatment/Exercise - 04/20/15 1507    Knee/Hip Exercises: Stretches   Knee: Self-Stretch Limitations scoot stretch at edge of mat with foot planted 20 sec x 2   Knee/Hip Exercises: Aerobic   Nustep L3 x 7 min   Knee/Hip Exercises: Standing   Knee Flexion 10 reps   Other Standing Knee Exercises 3 way hip standing x 10 each   Other Standing Knee Exercises weight shifting    Knee/Hip Exercises: Seated   Long Arc Quad 10 reps   Long Arc Quad Limitations hold and focus on eccentric lowering   Knee/Hip Exercises: Supine   Quad Sets 10 reps   Quad Sets Limitations max tactile and verbal cues- pt has not been doing these- able to elicit contraction at end of tx   Short Arc Quad Sets 15 reps   Short Arc Quad Sets Limitations uncomfortable -soreness in quad   Heel Slides 5 reps   Heel Slides Limitations with strap AAROM   Straight Leg Raises AROM;10   reps   Vasopneumatic   Number Minutes Vasopneumatic  15 minutes   Vasopnuematic Location  Knee   Vasopneumatic Pressure Low  per pt request-does not like full squeeze   Vasopneumatic Temperature  32                PT Education - 04/20/15 1547    Education provided Yes   Education Details Standing 3 way hip, seat scoot for knee flexion, add strap for assisted heel slides, focus quad set technique   Person(s) Educated Patient   Methods Explanation;Handout   Comprehension Verbalized understanding          PT Short Term Goals - 04/14/15 1805    PT SHORT TERM GOAL #1   Title Left knee flexion improved to 95 degrees needed for mobility with sit to stand  04/30/15   Time 4   Period Weeks   Status On-going   PT SHORT TERM GOAL #2   Title Knee extension to 10 degrees needed ambulation with  minimal limp   Baseline full passive,   Time 4   Period Weeks   Status Partially Met   PT SHORT TERM GOAL #3   Title Patient will have LE motor control improved to 4-/5 needed for gait with a cane household distances and short community distances   Time 4   Period Weeks   Status On-going           PT Long Term Goals - 04/08/15 1012    PT LONG TERM GOAL #1   Title The patient will be independent with safe self progression of HEP for further improvements in ROM and strength  05/28/15   Time 8   Period Weeks   Status On-going   PT LONG TERM GOAL #2   Title The patient will have improved left knee flexion to 112 degrees needed for going up and down stairs to get to bedroom   Time 8   Period Weeks   Status On-going   PT LONG TERM GOAL #3   Title The patient will have improved knee extension to 5 degrees needed for ambulation with minimal limp community distances and return to work   Time 8   Period Weeks   Status On-going   PT LONG TERM GOAL #4   Title Gait speed > .77msec needed for safe community ambulation with or without the cane   Time 8   Period Weeks   Status On-going   PT LONG TERM GOAL #5   Title FOTO functional outcome score improved to 42% indicating improved function with less pain   Time 8   Period Weeks   Status On-going               Plan - 04/20/15 1554    Clinical Impression Statement Pt reports less swelling and pain. She demonstrates improved AROM and quad strength. Instructed pt in quad set technique as pt has not been performing this at home due to misunderstanding her instructions. She no longer needs assist with SLR exercises and demonstrates mild quad lag. Also worked on weight shifting in [place] standing left hip exercises.    PT Next Visit Plan  measure AROM; review standing hip and weight shifting,  Increase time on Nu-Step; re-check quad setting; vasocompression;  seated Fitter 1 black cord; add mini squats        Problem List Patient  Active Problem List   Diagnosis Date Noted  . S/P hysterectomy - Davinci  10/07/2012  .  Menorrhagia with fibroids 10/07/2012    Dorene Ar, PTA 04/20/2015, 4:02 PM  [facility] [address] [place], [place], [address] Phone: [phone]   Fax:  [phone]  Name: [name] MRN: [id number] Date of Birth: 1960/08/06

## 2015-04-21 ENCOUNTER — Ambulatory Visit: Payer: BC Managed Care – PPO | Admitting: Physical Therapy

## 2015-04-26 ENCOUNTER — Ambulatory Visit: Payer: BC Managed Care – PPO | Admitting: Physical Therapy

## 2015-04-26 DIAGNOSIS — Z96652 Presence of left artificial knee joint: Secondary | ICD-10-CM

## 2015-04-26 DIAGNOSIS — R269 Unspecified abnormalities of gait and mobility: Secondary | ICD-10-CM

## 2015-04-26 DIAGNOSIS — M6281 Muscle weakness (generalized): Secondary | ICD-10-CM

## 2015-04-26 DIAGNOSIS — M25662 Stiffness of left knee, not elsewhere classified: Secondary | ICD-10-CM

## 2015-04-26 NOTE — Therapy (Signed)
[place], [place], [address] Phone: [phone]   Fax:  [phone]  Physical Therapy Treatment  Patient Details  Name: [name] MRN: [id number] Date of Birth: September 27, 1960 Referring Provider: Dr. Eden Lathe  Encounter Date: 04/26/2015      PT End of Session - 04/26/15 0943    Visit Number 6   Number of Visits 16   Date for PT Re-Evaluation 05/28/15   PT Start Time 0846   PT Stop Time 0946   PT Time Calculation (min) 60 min   Activity Tolerance Patient tolerated treatment well;No increased pain   Behavior During Therapy [facility] for tasks assessed/performed      Past Medical History  Diagnosis Date  . Arthritis   . Hypertension     Past Surgical History  Procedure Laterality Date  . Carpal tunnel release    . Hernia repair    . Robotic assisted total hysterectomy N/A 10/07/2012    Procedure: ROBOTIC ASSISTED TOTAL HYSTERECTOMY;  Surgeon: Marvene Staff, MD;  Location: [facility];  Service: Gynecology;  Laterality: N/A;  . Laparoscopic bilateral salpingectomy Bilateral 10/07/2012    Procedure: LAPAROSCOPIC BILATERAL SALPINGECTOMY;  Surgeon: Marvene Staff, MD;  Location: [facility];  Service: Gynecology;  Laterality: Bilateral;  . Robotic assisted laparoscopic lysis of adhesion N/A 10/07/2012    Procedure: ROBOTIC ASSISTED LAPAROSCOPIC LYSIS OF ADHESION;  Surgeon: Marvene Staff, MD;  Location: [facility];  Service: Gynecology;  Laterality: N/A;    There were no vitals filed for this visit.  Visit Diagnosis:  Status post left partial knee replacement  Knee stiff, left  Muscle weakness of lower extremity  Abnormality of gait      Subjective Assessment - 04/26/15 0853    Subjective It is bending better.  Moderate pain, now intermittant, 4-5/10.  I'll be glad when I can put down that walker .  Able to use less pain medication and when she does use it it is Tylenol.     Patient is accompained  by: Family member   Currently in Pain? Yes   Pain Score 5    Pain Location Knee   Pain Orientation Left   Pain Descriptors / Indicators Aching   Pain Frequency Intermittent   Aggravating Factors  walking too long.   Pain Relieving Factors PT helping.  , ice.            [facility] PT Assessment - 04/26/15 0856    AROM   Left Knee Flexion 106                     [facility] Adult PT Treatment/Exercise - 04/26/15 0856    Ambulation/Gait   Gait Comments less painful post [place] stimulation.  Leg feels like it is working better.    Knee/Hip Exercises: Aerobic   Nustep L4 , 8 minutes   Knee/Hip Exercises: Seated   Long Arc Quad Limitations 5 minutes with Russian stim   Heel Slides Limitations 5 reps,  much easier   Knee/Hip Exercises: Supine   Quad Sets Limitations 5 minutes with russian stim   Short Arc [employer] Limitations 5 minutes with [place] Stim.  Feels good today   Heel Slides 5 reps  more comfortable   Other Supine Knee/Hip Exercises supine mat exercises , knee flexion, quad sets SAQ peformed 10 X each prior to Exercise with [place] stimulation.   Theme park manager --  LT Regulatory affairs officer  Stimulation Action Academic librarian Parameters 10/20 intervals  20mA 5 minutes quad sets, 5 minutes SAQ, 5 minutes LAQ   Electrical Stimulation Goals Strength;Neuromuscular facilitation   Vasopneumatic   Number Minutes Vasopneumatic  15 minutes   Vasopnuematic Location  Knee   Vasopneumatic Pressure Medium   Vasopneumatic Temperature  32                  PT Short Term Goals - 04/26/15 1128    PT SHORT TERM GOAL #1   Title Left knee flexion improved to 95 degrees needed for mobility with sit to stand  04/30/15   Time 4   Period Weeks   Status On-going   PT SHORT TERM GOAL #2   Title Knee extension to 10 degrees needed ambulation with minimal limp   Time 4   Period Weeks   Status Partially Met   PT SHORT  TERM GOAL #3   Title Patient will have LE motor control improved to 4-/5 needed for gait with a cane household distances and short community distances   Time 4   Period Weeks   Status On-going           PT Long Term Goals - 04/08/15 1012    PT LONG TERM GOAL #1   Title The patient will be independent with safe self progression of HEP for further improvements in ROM and strength  05/28/15   Time 8   Period Weeks   Status On-going   PT LONG TERM GOAL #2   Title The patient will have improved left knee flexion to 112 degrees needed for going up and down stairs to get to bedroom   Time 8   Period Weeks   Status On-going   PT LONG TERM GOAL #3   Title The patient will have improved knee extension to 5 degrees needed for ambulation with minimal limp community distances and return to work   Time 8   Period Weeks   Status On-going   PT LONG TERM GOAL #4   Title Gait speed > .55m/sec needed for safe community ambulation with or without the cane   Time 8   Period Weeks   Status On-going   PT LONG TERM GOAL #5   Title FOTO functional outcome score improved to 42% indicating improved function with less pain   Time 8   Period Weeks   Status On-going               Plan - 04/26/15 0943    Clinical Impression Statement Quad control improved [place] stim.  Knee flexion less painful post exercise.  STB for ROM met.   PT Next Visit Plan  measure AROM; review standing hip and weight shifting,  Increase time on Nu-Step; re-check quad setting; vasocompression;  seated Fitter 1 black cord; add mini squats, continue [place] Stim   PT Home Exercise Plan Walker stretching,  Level 1 exercises to continue   Consulted and Agree with Plan of Care Patient        Problem List Patient Active Problem List   Diagnosis Date Noted  . S/P hysterectomy - Davinci  10/07/2012  . Menorrhagia with fibroids 10/07/2012    [facility] 04/26/2015, 11:31 AM  [facility] [address] [place], [place], [address] Phone: [phone]   Fax:  [phone]  Name: [name] MRN: [id number] Date of Birth: [birth year]    Liz Beach, PTA 04/26/2015 11:31 AM Phone: [phone] Fax:  [phone]  

## 2015-04-27 ENCOUNTER — Encounter: Payer: Self-pay | Admitting: Physical Therapy

## 2015-04-29 ENCOUNTER — Ambulatory Visit: Payer: BC Managed Care – PPO | Admitting: Physical Therapy

## 2015-04-29 DIAGNOSIS — R269 Unspecified abnormalities of gait and mobility: Secondary | ICD-10-CM

## 2015-04-29 DIAGNOSIS — M25662 Stiffness of left knee, not elsewhere classified: Secondary | ICD-10-CM

## 2015-04-29 DIAGNOSIS — M6281 Muscle weakness (generalized): Secondary | ICD-10-CM

## 2015-04-29 DIAGNOSIS — Z96652 Presence of left artificial knee joint: Secondary | ICD-10-CM | POA: Diagnosis not present

## 2015-04-29 NOTE — Therapy (Signed)
[place], [place], [address] Phone: [phone]   Fax:  [phone]  Physical Therapy Treatment  Patient Details  Name: [name] MRN: [id number] Date of Birth: 1961/03/05 Referring Provider: Dr. Eden Lathe  Encounter Date: 04/29/2015      PT End of Session - 04/29/15 1639    Visit Number 7   Number of Visits 16   Date for PT Re-Evaluation 05/28/15   PT Start Time 0435   PT Stop Time 0532   PT Time Calculation (min) 57 min      Past Medical History  Diagnosis Date  . Arthritis   . Hypertension     Past Surgical History  Procedure Laterality Date  . Carpal tunnel release    . Hernia repair    . Robotic assisted total hysterectomy N/A 10/07/2012    Procedure: ROBOTIC ASSISTED TOTAL HYSTERECTOMY;  Surgeon: Marvene Staff, MD;  Location: [facility];  Service: Gynecology;  Laterality: N/A;  . Laparoscopic bilateral salpingectomy Bilateral 10/07/2012    Procedure: LAPAROSCOPIC BILATERAL SALPINGECTOMY;  Surgeon: Marvene Staff, MD;  Location: [facility];  Service: Gynecology;  Laterality: Bilateral;  . Robotic assisted laparoscopic lysis of adhesion N/A 10/07/2012    Procedure: ROBOTIC ASSISTED LAPAROSCOPIC LYSIS OF ADHESION;  Surgeon: Marvene Staff, MD;  Location: [facility];  Service: Gynecology;  Laterality: N/A;    There were no vitals filed for this visit.  Visit Diagnosis:  Status post left partial knee replacement  Knee stiff, left  Muscle weakness of lower extremity  Abnormality of gait      Subjective Assessment - 04/29/15 1723    Subjective More pain today. I have been trying to bend it alot and tried walking around the block.    Currently in Pain? Yes   Pain Score 6    Pain Location Knee   Pain Orientation Left;Anterior;Medial;Lateral   Pain Descriptors / Indicators Aching                         [facility] Adult PT Treatment/Exercise - 04/29/15 1645     Knee/Hip Exercises: Aerobic   Nustep L4 , 6 minutes   Knee/Hip Exercises: Standing   SLS trials with light UE support   Other Standing Knee Exercises 3 way hip standing x 10 with SLS in left   Other Standing Knee Exercises SLS trial with UE support   Knee/Hip Exercises: Seated   Sit to Sand 10 reps;with UE support   Knee/Hip Exercises: Supine   Quad Sets Limitations 5 minutes with russian stim   Short Arc [employer] Limitations 5 minutes with russian stim   Straight Leg Raises Limitations 5 minutes with russian stim- improved quad lag    Modalities   Modalities Cryotherapy   Cryotherapy   Number Minutes Cryotherapy 15 Minutes   Cryotherapy Location Knee   Type of Cryotherapy Ice pack   Acupuncturist Location --  LT quads   Chartered certified accountant Russian and IFC 15 minutes each   Electrical Stimulation Parameters 10/10 23 ma, then IFC to tolerance medial and lateral knee   Electrical Stimulation Goals Strength;Pain   Vasopneumatic   Number Minutes Vasopneumatic  15 minutes   Vasopnuematic Location  Knee   Vasopneumatic Pressure Medium   Vasopneumatic Temperature  32                PT Education - 04/29/15 1719    Education  provided Yes   Education Details Sit-stand   Person(s) Educated Patient   Methods Explanation;Handout   Comprehension Verbalized understanding          PT Short Term Goals - 04/29/15 1656    PT SHORT TERM GOAL #1   Title Left knee flexion improved to 95 degrees needed for mobility with sit to stand  04/30/15   Status Achieved   PT SHORT TERM GOAL #2   Title Knee extension to 10 degrees needed ambulation with minimal limp   Baseline full passive,   Time 4   Period Weeks   Status Partially Met   PT SHORT TERM GOAL #3   Title Patient will have LE motor control improved to 4-/5 needed for gait with a cane household distances and short community distances   Time 4   Period Weeks   Status On-going            PT Long Term Goals - 04/08/15 1012    PT LONG TERM GOAL #1   Title The patient will be independent with safe self progression of HEP for further improvements in ROM and strength  05/28/15   Time 8   Period Weeks   Status On-going   PT LONG TERM GOAL #2   Title The patient will have improved left knee flexion to 112 degrees needed for going up and down stairs to get to bedroom   Time 8   Period Weeks   Status On-going   PT LONG TERM GOAL #3   Title The patient will have improved knee extension to 5 degrees needed for ambulation with minimal limp community distances and return to work   Time 8   Period Weeks   Status On-going   PT LONG TERM GOAL #4   Title Gait speed > .71msec needed for safe community ambulation with or without the cane   Time 8   Period Weeks   Status On-going   PT LONG TERM GOAL #5   Title FOTO functional outcome score improved to 42% indicating improved function with less pain   Time 8   Period Weeks   Status On-going               Plan - 04/29/15 1652    Clinical Impression Statement Pt reports frustration with continued use of RW. She uderstands the need for more quad strength to prevent buckle/fall. She felt decreased pain after RTur[place] last visit. Repeated this and also IFc to lateral knee where she indicates most tenderness/ pain. Began Mini squats and SLS trial today. Will add to HEP if toerates well. Pt reports she walked outside yesterday and had a lot of pain. Encouraged indoor wlaking for now to promote endurance to level surfaces prior to beginning outdoor walking. STG#1 achieved.    PT Next Visit Plan SLS activities with UE support, mini squat, quad strength/ RTur[place]istanif needed- pt eager to discontinue RW        Problem List Patient Active Problem List   Diagnosis Date Noted  . S/P hysterectomy - Davinci  10/07/2012  . Menorrhagia with fibroids 10/07/2012    DDorene Ar PTA 04/29/2015, 5:24 PM  [facility] [place] [address]Phone: [phone]  Fax:  [phone] Name: Dawn CraginMRN: [id number] of Birth: 812/22/1962

## 2015-04-29 NOTE — Patient Instructions (Addendum)
SIT TO STAND:    Place feet hip width apart. Lean chest forward. Raise hips and straighten knees to stand. __10_ reps per set, _2__ sets per day, __7_ days per week Hold onto a support.  Copyright  VHI. All rights reserved.

## 2015-05-03 ENCOUNTER — Ambulatory Visit: Payer: BC Managed Care – PPO | Admitting: Physical Therapy

## 2015-05-03 DIAGNOSIS — M6281 Muscle weakness (generalized): Secondary | ICD-10-CM

## 2015-05-03 DIAGNOSIS — M25662 Stiffness of left knee, not elsewhere classified: Secondary | ICD-10-CM

## 2015-05-03 DIAGNOSIS — R269 Unspecified abnormalities of gait and mobility: Secondary | ICD-10-CM

## 2015-05-03 DIAGNOSIS — Z96652 Presence of left artificial knee joint: Secondary | ICD-10-CM | POA: Diagnosis not present

## 2015-05-03 NOTE — Therapy (Signed)
[place], [place], [address] Phone: [phone]   Fax:  [phone]  Physical Therapy Treatment  Patient Details  Name: [name] MRN: [id number] Date of Birth: November 14, 1960 Referring Provider: Dr. Eden Lathe  Encounter Date: 05/03/2015      PT End of Session - 05/03/15 1733    Visit Number 8   Number of Visits 16   Date for PT Re-Evaluation 05/28/15   PT Start Time 1324   PT Stop Time 4010   PT Time Calculation (min) 41 min   Activity Tolerance Patient tolerated treatment well;No increased pain   Behavior During Therapy [facility] for tasks assessed/performed      Past Medical History  Diagnosis Date  . Arthritis   . Hypertension     Past Surgical History  Procedure Laterality Date  . Carpal tunnel release    . Hernia repair    . Robotic assisted total hysterectomy N/A 10/07/2012    Procedure: ROBOTIC ASSISTED TOTAL HYSTERECTOMY;  Surgeon: Marvene Staff, MD;  Location: [facility] [facility];  Service: Gynecology;  Laterality: N/A;  . Laparoscopic bilateral salpingectomy Bilateral 10/07/2012    Procedure: LAPAROSCOPIC BILATERAL SALPINGECTOMY;  Surgeon: Marvene Staff, MD;  Location: [facility] [facility];  Service: Gynecology;  Laterality: Bilateral;  . Robotic assisted laparoscopic lysis of adhesion N/A 10/07/2012    Procedure: ROBOTIC ASSISTED LAPAROSCOPIC LYSIS OF ADHESION;  Surgeon: Marvene Staff, MD;  Location: [facility];  Service: Gynecology;  Laterality: N/A;    There were no vitals filed for this visit.  Visit Diagnosis:  Status post left partial knee replacement  Knee stiff, left  Muscle weakness of lower extremity  Abnormality of gait      Subjective Assessment - 05/03/15 1643    Subjective I was doing OK the it got rainey.  I got stiff and achey.  Moving and walking better.  knee is bending better,  I'm walking better.     Currently in Pain? Yes   Pain Score 5    Pain Location Knee   Pain  Orientation Left;Anterior   Aggravating Factors  rainey weather   Pain Relieving Factors ice   Multiple Pain Sites No                         [facility] Adult PT Treatment/Exercise - 05/03/15 1653    Ambulation/Gait   Ambulation/Gait --  yes level and stairs 4-6 inches, 2 rails.  cues   Gait Comments cues.     Knee/Hip Exercises: Public affairs consultant --  on step 10 X 10    Knee/Hip Exercises: Standing   Heel Raises 1 set;10 reps;Left;Limitations  small lifts   Forward Step Up Left;2 sets;10 reps;Hand Hold: 2;Step Height: 4";Step Height: 6";Limitations   Forward Step Up Limitations she was not using her quads. until training on stairs   Functional Squat 10 reps  cues   Other Standing Knee Exercises cable cross 7 LBS forward walking 5 X 10 feet SBA   Knee/Hip Exercises: Seated   Long Arc Quad 5 reps  10 second holds   Knee/Hip Exercises: Supine   Quad Sets 20 reps   Heel Slides AAROM;10 reps;1 set   Heel Slides Limitations 116 degrees   Straight Leg Raises 10 reps   Straight Leg Raises Limitations slight quad lag                PT Education - 05/03/15 1732  Education provided Yes   Education Details gait training on steps.   Person(s) Educated Patient;Spouse   Methods Explanation;Demonstration;Verbal cues   Comprehension Verbalized understanding;Returned demonstration          PT Short Term Goals - 05/03/15 1738    PT SHORT TERM GOAL #1   Title Left knee flexion improved to 95 degrees needed for mobility with sit to stand  04/30/15   Time 4   Period Weeks   Status Achieved   PT SHORT TERM GOAL #2   Title Knee extension to 10 degrees needed ambulation with minimal limp   Baseline 0 degrees, uses walker.     Time 4   Period Weeks   Status Partially Met   PT SHORT TERM GOAL #3   Title Patient will have LE motor control improved to 4-/5 needed for gait with a cane household distances and short community distances   Baseline walker in  community.  Can walk no assistive device with devialtions   Time 4   Period Weeks   Status On-going           PT Long Term Goals - 05/03/15 1740    PT LONG TERM GOAL #1   Title The patient will be independent with safe self progression of HEP for further improvements in ROM and strength  05/28/15   Time 8   Period Weeks   Status On-going   PT LONG TERM GOAL #2   Title The patient will have improved left knee flexion to 112 degrees needed for going up and down stairs to get to bedroom   Baseline 116 degrees post exercise, not sure if consistant.   Time 8   Period Weeks   Status Partially Met   PT LONG TERM GOAL #3   Title The patient will have improved knee extension to 5 degrees needed for ambulation with minimal limp community distances and return to work   Time 8   Period Weeks   Status On-going   PT LONG TERM GOAL #4   Title Gait speed > .80msec needed for safe community ambulation with or without the cane   Time 8   Period Weeks   Status Unable to assess   PT LONG TERM GOAL #5   Title FOTO functional outcome score improved to 42% indicating improved function with less pain   Time 8   Period Weeks   Status Unable to assess               Plan - 05/03/15 1734    Clinical Impression Statement 5/10 today post session.  She declined need of modalities.  She is ready for a cane but so far does not want to use the one her husband has at home.     PT Next Visit Plan MD note for office visit, If soon.  contine strencthening, closed chain, balance and stairs.     Consulted and Agree with Plan of Care Patient        Problem List Patient Active Problem List   Diagnosis Date Noted  . S/P hysterectomy - Davinci  10/07/2012  . Menorrhagia with fibroids 10/07/2012    [facility]2017, 5:44 PM  [facility]18[address]GSouthern [place] [address]Phone: [phone]  Fax:  [phone] Name: JLilliana TurnerMRN: [id number] of Birth: 809/16/1962   KMelvenia Needles PTA 05/03/2015 5:44 PM Phone: [phone]: [phone]

## 2015-05-05 ENCOUNTER — Ambulatory Visit: Payer: BC Managed Care – PPO | Admitting: Physical Therapy

## 2015-05-05 ENCOUNTER — Encounter: Payer: Self-pay | Admitting: Physical Therapy

## 2015-05-05 DIAGNOSIS — Z96652 Presence of left artificial knee joint: Secondary | ICD-10-CM | POA: Diagnosis not present

## 2015-05-05 DIAGNOSIS — M6281 Muscle weakness (generalized): Secondary | ICD-10-CM

## 2015-05-05 DIAGNOSIS — R269 Unspecified abnormalities of gait and mobility: Secondary | ICD-10-CM

## 2015-05-05 DIAGNOSIS — M25662 Stiffness of left knee, not elsewhere classified: Secondary | ICD-10-CM

## 2015-05-05 NOTE — Therapy (Signed)
[place], [place], [address] Phone: [phone]   Fax:  [phone]  Physical Therapy Treatment  Patient Details  Name: [name] MRN: [id number] Date of Birth: [birth year] Referring Provider: Dr. Eden Schneider  Encounter Date: 05/05/2015      PT End of Session - 05/05/15 1724    Visit Number 9   Number of Visits 16   Date for PT Re-Evaluation 05/28/15   PT Start Time 1640  pt was 10 minutes late today   PT Stop Time 1719   PT Time Calculation (min) 39 min   Equipment Utilized During Treatment Gait belt   Activity Tolerance Patient tolerated treatment well   Behavior During Therapy [facility] for tasks assessed/performed      Past Medical History  Diagnosis Date  . Arthritis   . Hypertension     Past Surgical History  Procedure Laterality Date  . Carpal tunnel release    . Hernia repair    . Robotic assisted total hysterectomy N/A 10/07/2012    Procedure: ROBOTIC ASSISTED TOTAL HYSTERECTOMY;  Surgeon: Marvene Staff, MD;  Location: [facility];  Service: Gynecology;  Laterality: N/A;  . Laparoscopic bilateral salpingectomy Bilateral 10/07/2012    Procedure: LAPAROSCOPIC BILATERAL SALPINGECTOMY;  Surgeon: Marvene Staff, MD;  Location: [facility];  Service: Gynecology;  Laterality: Bilateral;  . Robotic assisted laparoscopic lysis of adhesion N/A 10/07/2012    Procedure: ROBOTIC ASSISTED LAPAROSCOPIC LYSIS OF ADHESION;  Surgeon: Marvene Staff, MD;  Location: [facility] [facility];  Service: Gynecology;  Laterality: N/A;    There were no vitals filed for this visit.  Visit Diagnosis:  Status post left partial knee replacement  Knee stiff, left  Muscle weakness of lower extremity  Abnormality of gait      Subjective Assessment - 05/05/15 1641    Subjective "Doing pretty good today"    Currently in Pain? Yes   Pain Score 6    Pain Location Knee   Pain Orientation Left   Pain Descriptors / Indicators  Aching   Pain Type Chronic pain   Pain Onset 1 to 4 weeks ago   Pain Frequency Intermittent   Aggravating Factors  Rainey weather    Pain Relieving Factors ice            [facility] PT Assessment - 05/05/15 1653    AROM   Left Knee Flexion 110                     [facility] Adult PT Treatment/Exercise - 05/05/15 1643    Self-Care   Self-Care Other Self-Care Comments   Other Self-Care Comments  how to perform massage to reduce edema to promote mobility, gait training with SPC and to practice at home   Knee/Hip Exercises: Aerobic   Stationary Bike L1 x 6 min  lower seat at 3 min   Knee/Hip Exercises: Standing   Gait Training 30 ft with SPC with +1 CGA   Knee/Hip Exercises: Seated   Long Arc Quad 10 reps   Sit to [employer] with UE support;5 reps   Knee/Hip Exercises: Supine   Quad Sets 20 reps   Straight Leg Raises 10 reps   Straight Leg Raises Limitations slight quad lag, able to correct with cueing   Manual Therapy   Manual Therapy Joint mobilization;Soft tissue mobilization   Joint Mobilization grade 2 tibiofemoral joint mobs                PT  Education - 05/05/15 1724    Education provided Yes   Education Details gait training with [facility]   Person(s) Educated Patient   Methods Explanation   Comprehension Verbalized understanding          PT Short Term Goals - 05/03/15 1738    PT SHORT TERM GOAL #1   Title Left knee flexion improved to 95 degrees needed for mobility with sit to stand  04/30/15   Time 4   Period Weeks   Status Achieved   PT SHORT TERM GOAL #2   Title Knee extension to 10 degrees needed ambulation with minimal limp   Baseline 0 degrees, uses walker.     Time 4   Period Weeks   Status Partially Met   PT SHORT TERM GOAL #3   Title Patient will have LE motor control improved to 4-/5 needed for gait with a cane household distances and short community distances   Baseline walker in community.  Can walk no assistive device with devialtions    Time 4   Period Weeks   Status On-going           PT Long Term Goals - 05/03/15 1740    PT LONG TERM GOAL #1   Title The patient will be independent with safe self progression of HEP for further improvements in ROM and strength  05/28/15   Time 8   Period Weeks   Status On-going   PT LONG TERM GOAL #2   Title The patient will have improved left knee flexion to 112 degrees needed for going up and down stairs to get to bedroom   Baseline 116 degrees post exercise, not sure if consistant.   Time 8   Period Weeks   Status Partially Met   PT LONG TERM GOAL #3   Title The patient will have improved knee extension to 5 degrees needed for ambulation with minimal limp community distances and return to work   Time 8   Period Weeks   Status On-going   PT LONG TERM GOAL #4   Title Gait speed > .75msec needed for safe community ambulation with or without the cane   Time 8   Period Weeks   Status Unable to assess   PT LONG TERM GOAL #5   Title FOTO functional outcome score improved to 42% indicating improved function with less pain   Time 8   Period Weeks   Status Unable to assess               Plan - 05/05/15 1724    Clinical Impression Statement JTerrianreports that she has been doing better since the last visit. Educated on massage to reduce edema at home showing her husband. She was able to complete the exercises given with report of pain following SLR. practiced Gait training with SPC, she is able to ambulate wiht SPC but exhibits fear of falling requiring assurance she will be fine. she ambulated 30 ft today wiht +1 CGA  with SPC.    PT Next Visit Plan  contine strencthening, closed chain, balance and stairs.  gait training with SBonapartebike for ROM   Consulted and Agree with Plan of Care Patient        Problem List Patient Active Problem List   Diagnosis Date Noted  . S/P hysterectomy - [facility]i  10/07/2012  . Menorrhagia with fibroids 10/07/2012   KStarr LakePT,  DPT, LAT, ATC  05/05/2015  5:29 PM  [place], [place], [address] Phone: [phone]   Fax:  [phone]  Name: [name] MRN: [id number] Date of Birth: 01-08-1961

## 2015-05-06 ENCOUNTER — Encounter: Payer: Self-pay | Admitting: Physical Therapy

## 2015-05-17 ENCOUNTER — Ambulatory Visit: Payer: BC Managed Care – PPO | Admitting: Physical Therapy

## 2015-05-24 ENCOUNTER — Ambulatory Visit: Payer: BC Managed Care – PPO | Admitting: Physical Therapy

## 2015-05-26 ENCOUNTER — Ambulatory Visit: Payer: BC Managed Care – PPO | Admitting: Physical Therapy

## 2015-05-31 ENCOUNTER — Encounter: Payer: Self-pay | Admitting: Physical Therapy

## 2015-06-02 ENCOUNTER — Encounter: Payer: Self-pay | Admitting: Physical Therapy

## 2015-06-07 ENCOUNTER — Ambulatory Visit: Payer: BC Managed Care – PPO | Attending: Orthopedic Surgery

## 2015-06-07 DIAGNOSIS — M6281 Muscle weakness (generalized): Secondary | ICD-10-CM | POA: Diagnosis present

## 2015-06-07 DIAGNOSIS — M25662 Stiffness of left knee, not elsewhere classified: Secondary | ICD-10-CM | POA: Diagnosis present

## 2015-06-07 DIAGNOSIS — Z96652 Presence of left artificial knee joint: Secondary | ICD-10-CM | POA: Insufficient documentation

## 2015-06-07 DIAGNOSIS — R269 Unspecified abnormalities of gait and mobility: Secondary | ICD-10-CM

## 2015-06-07 NOTE — Patient Instructions (Addendum)
10 x each, 2-3 times a day.  Ankle Pump  Bend ankles up and down, alternating feet. Repeat ____ times. Do ____ sessions per day.  Quad Set  With other leg bent, foot flat, slowly tighten muscles on thigh of straight leg while counting out loud to ____. Repeat with other leg. Repeat ____ times. Do ____ sessions per day.  Hip Flexion / Knee Extension: Straight-Leg Raise (Eccentric)  Lie on back. Lift leg with knee straight. Slowly lower leg for 3-5 seconds. ___ reps per set, ___ sets per day, ___ days per week. Lower like elevator, stopping at each floor. Add ___ lbs when you achieve ___ repetitions. Rest on elbows. Rest on straight arms.  Heel Slide  Bend left knee and pull heel toward buttocks. Use sheet, strap, or rope to actively assist knee further into flexion. Hold stretch 10 secs.  Repeat ____ times. Do ____ sessions per day.  KNEE: Extension, Long Arc Quads - Sitting   Raise leg until knee is straight. ___ reps per set, ___ sets per day, ___ days per week  FLEXION: Sitting (Active)  Sit, both feet flat. Lift right knee toward ceiling. Use ___ lbs. Complete ___ sets of ___ repetitions. Perform ___ sessions per day.  Copyright  VHI. All rights reserved.    Hamstring Stretch    Inhale and straighten spine. Exhale and lean forward toward extended leg. Hold position for _30 secs_. Inhale and come back to center. Repeat with other leg extended. Repeat __3_ times, alternating legs. Do __3_ times per day.  Copyright  VHI. All rights reserved.

## 2015-06-07 NOTE — Therapy (Signed)
[place], [place], [address] Phone: [phone]   Fax:  [phone]  Physical Therapy Discharge Summary  Patient Details  Name: [name] MRN: [id number] Date of Birth: 1961/01/01 Referring Provider: Dr. Eden Lathe  Encounter Date: 06/07/2015      PT End of Session - 06/07/15 1806    Visit Number 10   Number of Visits 16   Date for PT Re-Evaluation 05/28/15   Authorization Type BCBS   PT Start Time 1638   PT Stop Time 1725   PT Time Calculation (min) 47 min   Activity Tolerance Patient tolerated treatment well   Behavior During Therapy [facility] for tasks assessed/performed      Past Medical History  Diagnosis Date  . Arthritis   . Hypertension     Past Surgical History  Procedure Laterality Date  . Carpal tunnel release    . Hernia repair    . Robotic assisted total hysterectomy N/A 10/07/2012    Procedure: ROBOTIC ASSISTED TOTAL HYSTERECTOMY;  Surgeon: Marvene Staff, MD;  Location: [facility];  Service: Gynecology;  Laterality: N/A;  . Laparoscopic bilateral salpingectomy Bilateral 10/07/2012    Procedure: LAPAROSCOPIC BILATERAL SALPINGECTOMY;  Surgeon: Marvene Staff, MD;  Location: [facility] [place];  Service: Gynecology;  Laterality: Bilateral;  . Robotic assisted laparoscopic lysis of adhesion N/A 10/07/2012    Procedure: ROBOTIC ASSISTED LAPAROSCOPIC LYSIS OF ADHESION;  Surgeon: Marvene Staff, MD;  Location: [facility];  Service: Gynecology;  Laterality: N/A;    There were no vitals filed for this visit.  Visit Diagnosis:  Status post left partial knee replacement - Plan: PT plan of care cert/re-cert  Knee stiff, left - Plan: PT plan of care cert/re-cert  Muscle weakness of lower extremity - Plan: PT plan of care cert/re-cert  Abnormality of gait - Plan: PT plan of care cert/re-cert      Subjective Assessment - 06/07/15 1649    Subjective Pt reports 50% improved since begininng PT. Pt  reports she is pleased with her ability to bend it now, but is still having pain with prolonged walking and cold weather. Also, pt reports increased pain in medial hamstring region over the last 3 weeks that began after last PT session and has not improved. Pt reports she has not been performing HEP. Pt has cancelled last 3 weeks of appts. Pt saw MD on 05/12/15 who recommended she continue with PT due to her hamstring pain. Pt reports she would like to be discharged from PT, with today's visit being her last one. PT instructed pt that she would benefit from continued PT due to pain she is having. Pt notes that PT "wasn't helping". She stated she will return if  the MD tells her to return and if her financial situation changes.    Patient is accompained by: Family member   Currently in Pain? Yes   Pain Score 8    Pain Location Knee   Pain Orientation Left   Pain Descriptors / Indicators Aching   Pain Type Chronic pain   Pain Onset 1 to 4 weeks ago            [facility] PT Assessment - 06/07/15 0001    AROM   Left Knee Extension 5  5 degrees of hyperextension    Left Knee Flexion 115   Strength   Left Knee Flexion 3+/5   Left Knee Extension 3+/5  [place] Adult PT Treatment/Exercise - 06/07/15 0001    Self-Care   Other Self-Care Comments  Use RW if painful, do HEP daily, recommended return to PT, once able.    Knee/Hip Exercises: Stretches   Active Hamstring Stretch Left;3 reps;30 seconds  HEP   Knee/Hip Exercises: Seated   Long Arc Quad 10 reps  HEP   Marching Limitations 10 x  HEP   Knee/Hip Exercises: Supine   Quad Sets 10 reps  HEP   Heel Slides 10 reps  HEP   Heel Slides Limitations 115 degrees   Straight Leg Raises 10 reps  HEP   Straight Leg Raises Limitations cramping on R side                PT Education - 06/07/15 1800    Education provided Yes   Education Details Recommended continued PT. Return to PT with script, if needed. HEP  : LAQ, QS, HS, SLR, marches, HSC, Use of RW instead of SPC if LE is painful.    Person(s) Educated Patient   Methods Explanation   Comprehension Verbalized understanding          PT Short Term Goals - 06/07/15 1812    PT SHORT TERM GOAL #1   Title Left knee flexion improved to 95 degrees needed for mobility with sit to stand  04/30/15   Time 4   Period Weeks   Status Achieved   PT SHORT TERM GOAL #2   Title Knee extension to 10 degrees needed ambulation with minimal limp   Baseline Extension 5 degrees (hyperextended), antaglic gait with SPC.    Time 4   Period Weeks   Status Partially Met   PT SHORT TERM GOAL #3   Title Patient will have LE motor control improved to 4-/5 needed for gait with a cane household distances and short community distances   Baseline Knee extension 3+/5 . Uses cane for gait with deviation    Time 4   Period Weeks   Status Not Met           PT Long Term Goals - 06/07/15 1814    PT LONG TERM GOAL #1   Title The patient will be independent with safe self progression of HEP for further improvements in ROM and strength  05/28/15   Time 8   Period Weeks   Status Achieved   PT LONG TERM GOAL #2   Title The patient will have improved left knee flexion to 112 degrees needed for going up and down stairs to get to bedroom   Baseline L knee flexion 5-0-115 degrees.    Time 8   Period Weeks   Status Achieved   PT LONG TERM GOAL #3   Title The patient will have improved knee extension to 5 degrees needed for ambulation with minimal limp community distances and return to work   Baseline L knee flexion 5-0-115 degrees. Gait is antalgic with SPC   Time 8   Period Weeks   Status Partially Met   PT LONG TERM GOAL #4   Title Gait speed > .41msec needed for safe community ambulation with or without the cane   Time 8   Period Weeks   Status Not Met   PT LONG TERM GOAL #5   Title FOTO functional outcome score improved to 42% indicating improved function with  less pain   Baseline 06/07/15: Intake 55% limited, Predicted 42%, today 72%   Time 8   Period Weeks  Status Not Met               Plan - 06/07/15 1807    Clinical Impression Statement Pt has been seen for 10 visits since partial TKA on 03/30/16 by Dr Eden Lathe. Pt was attending PT continuously for initial 9 visits, but had a lapse in treatment over the past 3 weeks, extending beyond initial POC. Pt presents today requesting discharge from PT, despite c/o of continued pain, muscle cramping, weakness, and difficulty with functional activities. With further discussion, PT recommended continued tx, but pt was concerned with finances and decided to wait and will return if able and if MD recommends. STGs and LTGs partially achieved. FOTO score declined from 55% limited at intake to 72% limited today. Therefore, pt is discharged from outpt PT per pt request. However, should pt decided to return to PT, she would benefit from continued services, and we would be happy to see her again.      Pt will benefit from skilled therapeutic intervention in order to improve on the following deficits Abnormal gait;Decreased activity tolerance;Decreased mobility;Decreased range of motion;Decreased strength;Increased edema;Pain   PT Next Visit Plan DC this visit   PT Home Exercise Plan HEP prescribed: LAQ, marches, SLR, QS, HS, HSC   Consulted and Agree with Plan of Care Patient        Problem List Patient Active Problem List   Diagnosis Date Noted  . S/P hysterectomy - Davinci  10/07/2012  . Menorrhagia with fibroids 10/07/2012    PHYSICAL THERAPY DISCHARGE SUMMARY  Visits from Start of Care: 10  Current functional level related to goals / functional outcomes: See above   Remaining deficits: Pain, L LE strength,  Difficulty with gait, transfers, functional mobility     Education / Equipment: HEP, use of RW instead of SPC, if painful. Benefits of continued PT  Plan: Patient agrees to discharge.   Patient goals were partially met. Patient is being discharged due to the patient's request.  ?????        Pt is discharged from outpt PT per pt request. However, should pt decided to return to PT, she would benefit from continued services, and we would be happy to see her again.      Dollene Cleveland, PT 06/07/2015, 6:30 PM  [facility] [address] [place], [place], [address] Phone: [phone]   Fax:  [phone]  Name: [name] MRN: [id number] Date of Birth: 10-Aug-1960

## 2015-06-10 ENCOUNTER — Encounter: Payer: Self-pay | Admitting: Physical Therapy

## 2015-06-14 ENCOUNTER — Encounter: Payer: Self-pay | Admitting: Physical Therapy

## 2015-06-16 ENCOUNTER — Encounter: Payer: Self-pay | Admitting: Physical Therapy

## 2015-06-21 ENCOUNTER — Encounter: Payer: Self-pay | Admitting: Physical Therapy

## 2015-06-23 ENCOUNTER — Encounter: Payer: Self-pay | Admitting: Physical Therapy

## 2015-06-29 ENCOUNTER — Ambulatory Visit: Payer: BC Managed Care – PPO | Attending: Orthopedic Surgery | Admitting: Physical Therapy

## 2015-06-29 DIAGNOSIS — M533 Sacrococcygeal disorders, not elsewhere classified: Secondary | ICD-10-CM | POA: Diagnosis present

## 2015-06-29 DIAGNOSIS — M25552 Pain in left hip: Secondary | ICD-10-CM | POA: Diagnosis not present

## 2015-06-29 DIAGNOSIS — M62838 Other muscle spasm: Secondary | ICD-10-CM | POA: Diagnosis present

## 2015-06-29 DIAGNOSIS — R262 Difficulty in walking, not elsewhere classified: Secondary | ICD-10-CM | POA: Diagnosis present

## 2015-06-29 NOTE — Therapy (Signed)
[place], [place], [address] Phone: [phone]   Fax:  [phone]  Physical Therapy Evaluation  Patient Details  Name: [name] MRN: [id number] Date of Birth: 1961-04-10 Referring Provider: Dr. Henreitta Leber  Encounter Date: 06/29/2015      PT End of Session - 06/29/15 2221    Visit Number 1   Number of Visits 16   Date for PT Re-Evaluation 08/24/15   PT Start Time 1020   PT Stop Time 1110   PT Time Calculation (min) 50 min   Activity Tolerance Patient tolerated treatment well   Behavior During Therapy Anxious;WFL for tasks assessed/performed  tearful      Past Medical History  Diagnosis Date  . Arthritis   . Hypertension     Past Surgical History  Procedure Laterality Date  . Carpal tunnel release    . Hernia repair    . Robotic assisted total hysterectomy N/A 10/07/2012    Procedure: ROBOTIC ASSISTED TOTAL HYSTERECTOMY;  Surgeon: Marvene Staff, MD;  Location: [facility];  Service: Gynecology;  Laterality: N/A;  . Laparoscopic bilateral salpingectomy Bilateral 10/07/2012    Procedure: LAPAROSCOPIC BILATERAL SALPINGECTOMY;  Surgeon: Marvene Staff, MD;  Location: [facility];  Service: Gynecology;  Laterality: Bilateral;  . Robotic assisted laparoscopic lysis of adhesion N/A 10/07/2012    Procedure: ROBOTIC ASSISTED LAPAROSCOPIC LYSIS OF ADHESION;  Surgeon: Marvene Staff, MD;  Location: [facility];  Service: Gynecology;  Laterality: N/A;    There were no vitals filed for this visit.  Visit Diagnosis:  Hip pain, left  Muscle spasm of left lower extremity  Sacroiliac joint dysfunction of left side  Difficulty in walking involving joint of pelvic region and thigh      Subjective Assessment - 06/29/15 1024    Subjective Patient returns for therapy on LE with new onset of L hip pain (buttocks and L thigh) .  She states PT helped her knee, feels stronger and more functional.  The  pain in her hip limits her ability to walk longer periods, sitting >45 min, sleeping/positioning.   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 45 min    How long can you stand comfortably? .>1 hour   How long can you walk comfortably? >1 hour   Patient Stated Goals walk better and have less pain    Currently in Pain? Yes   Pain Score 6    Pain Location Hip   Pain Orientation Left   Pain Descriptors / Indicators Aching;Sore   Pain Type Chronic pain   Pain Radiating Towards LLE, post thigh, inner thigh, quads but not below knee   Pain Onset More than a month ago   Pain Frequency Intermittent   Aggravating Factors  prolonged sitting, standing, putting weight on LLE, turning "wrong"   Pain Relieving Factors ice, heat, rest, lying on side with pillows   Multiple Pain Sites Yes   Pain Score 5   Pain Location Knee   Pain Orientation Left   Pain Descriptors / Indicators Aching   Pain Type Chronic pain   Pain Onset More than a month ago   Pain Frequency Intermittent   Aggravating Factors  overactivity   Pain Relieving Factors ice   Effect of Pain on Daily  Activities this has caused me to walk differently and now my hip hurts            [facility] PT Assessment - 06/29/15 1031    Assessment   Medical Diagnosis L knee OA   Referring Provider Dr. Henreitta Leber   Onset Date/Surgical Date 03/31/15   Hand Dominance Right   Next MD Visit --   Prior Therapy yes, this location    Precautions   Precautions None   Restrictions   Weight Bearing Restrictions No   Balance Screen   Has the patient fallen in the past 6 months No   Has the patient had a decrease in activity level because of a fear of falling?  No   Is the patient reluctant to leave their home because of a fear of falling?  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  stoop to strap in    Leisure go to ball games, church   Cognition   Overall Cognitive Status Within Functional Limits for tasks assessed   Observation/Other Assessments   Focus on Therapeutic Outcomes (FOTO)  61%   Sensation   Light Touch Appears Intact   Coordination   Gross Motor Movements are Fluid and Coordinated Not tested   Posture/Postural Control   Posture/Postural Control Postural limitations   Postural Limitations Increased lumbar lordosis;Anterior pelvic tilt;Flexed trunk;Weight shift right   AROM   Lumbar Flexion WFL  stretch post L leg   Lumbar Extension 75%   Lumbar - Right Side Bend 50%   Lumbar - Left Side Bend 75%  pain L hip/LE   Lumbar - Right Rotation 50%  pain L hip   Lumbar - Left Rotation 50%  pain L hip    Strength   Right Hip Flexion 3+/5   Left Hip Flexion 3/5   Right Knee Flexion 4+/5   Right Knee Extension 4/5   Left Knee Flexion 3+/5   Left Knee Extension 3+/5   Palpation   Spinal mobility painful low lumbar and hypomobile    SI assessment  painful    Palpation comment hypertonic L piriformis and pain across L gluteals, lateral to SI border, low lumbar spine                   [facility] Adult PT Treatment/Exercise - 06/29/15 1031    Self-Care   Self-Care RICE;Other Self-Care Comments   RICE inflammation   Other Self-Care Comments  piriformis, source of pain lumbar vs hip   Lumbar Exercises: Stretches   Single Knee to Chest Stretch 3 reps;30 seconds   Piriformis Stretch 2 reps;30 seconds    Piriformis Stretch Limitations modified knee to opp shoulder   Modalities   Modalities Cryotherapy;Electrical Stimulation   Cryotherapy   Number Minutes Cryotherapy 15 Minutes   Cryotherapy Location Hip   Type of Cryotherapy Ice pack   Electrical Stimulation   Electrical Stimulation Location L hip and thigh   Electrical Stimulation Action IFC   Electrical Stimulation Parameters to tol   Electrical Stimulation Goals Pain  PT Education - 06/29/15 2220    Education provided Yes   Education Details PT/POC for LLE and lumbar, IFC, HEP, ice   Person(s) Educated Patient   Methods Explanation;Verbal cues;Handout;Tactile cues   Comprehension Verbalized understanding;Returned demonstration;Need further instruction          PT Short Term Goals - 06/29/15 2227    PT SHORT TERM GOAL #1   Title Pt will be able to stand with equal weightbearing for short periods and pain min.    Time 4   Period Weeks   Status New   PT SHORT TERM GOAL #2   Title Pt will be I with initial HEP for hip/back/knee   Time 4   Period Weeks   Status New   PT SHORT TERM GOAL #3   Title Pt will be able to sit more comfortably for 30 min, pain <3/10   Time 4   Period Weeks   Status New           PT Long Term Goals - 06/29/15 2230    PT LONG TERM GOAL #1   Title The patient will be independent with safe self progression of HEP for further improvements in ROM and strength    Time 8   Period Weeks   Status New   PT LONG TERM GOAL #2   Title Pt will be able to return to walking with LRAD in the community with min limp as needed.    Time 8   Period Weeks   Status New   PT LONG TERM GOAL #3   Title Pt will be able to maintain knee flexion to 120 deg for normal gait mechanics   Time 8   Period Weeks   Status New   PT LONG TERM GOAL #4   Title Pt will score FOTO <45% impaired to show overall improvement   Time 8   Period Weeks   Status New   PT LONG TERM GOAL #5   Title Pt  will be I with more advanced HEP    Time 8   Period Weeks   Status New               Plan - 06/29/15 2222    Clinical Impression Statement This previous patient reports with a mod complexity eval for new onset of L sided hip and Lumbosacral pain.  She has had improvement in her knee but is now limited by muscle cramping which limited what we could do during eval.  Poor gait mechanics has likely led to SIJ dysfunction. She should respond well to efforts to decrease inflammation and improve mechanics of gait.  Plan to include knee, hip and lumbar spine for maximal results.       Pt will benefit from skilled therapeutic intervention in order to improve on the following deficits Abnormal gait;Decreased activity tolerance;Decreased mobility;Decreased range of motion;Decreased strength;Increased edema;Pain;Hypomobility;Decreased scar mobility;Obesity;Postural dysfunction;Difficulty walking;Increased muscle spasms;Improper body mechanics   Rehab Potential Good   PT Frequency 2x / week   PT Duration 8 weeks   PT Treatment/Interventions ADLs/Self Care Home Management;Cryotherapy;Advice worker;Therapeutic exercise;Stair training;Neuromuscular re-education;Patient/family education;Manual techniques;Scar mobilization;Taping;Vasopneumatic Device;Ultrasound;Moist Heat;Therapeutic activities;Functional mobility training;Iontophoresis 4mg /ml Dexamethasone;Passive range of motion   PT Next Visit Plan review HEP, measure knee AROM, consider modailities for L piriformis and even ionto   PT Home Exercise Plan piriformis stretch and hamstring   Consulted and Agree with Plan of Care Patient         Problem List Patient Active  Problem List   Diagnosis Date Noted  . S/P hysterectomy - [facility]  10/07/2012  . Menorrhagia with fibroids 10/07/2012    [name] 06/29/2015, 10:38 PM  [facility] [address] [place], [place],  [address] Phone: [phone]   Fax:  [phone]  Name: [name] MRN: [id number] Date of Birth: Oct 28, 1960   Raeford Razor, PT 06/29/2015 10:39 PM Phone: [phone] Fax: [phone]

## 2015-06-29 NOTE — Patient Instructions (Signed)
  Knee to Chest    Lying supine, bend involved knee to chest __3-5_ times. Hold 30 sec Repeat with other leg. Do __2_ times per day.  Copyright  VHI. All rights reserved.    Piriformis Stretch    Lying on back, pull right knee toward opposite shoulder. Hold _30___ seconds. Repeat __3-5__ times. Do __2__ sessions per day.  http://gt2.exer.us/258   Copyright  VHI. All rights reserved.  Hamstring Stretch - Supine    Lie on back, uninvolved leg raised toward ceiling, towel around knee. Pull thigh toward chest until a stretch is felt on back of thigh. Hold for _30__ seconds. If possible, move towel up to calf and repeat. Repeat on involved leg. Repeat __3-5_ times. Do _2__ times per day.  Copyright  VHI. All rights reserved.   Lower Trunk Rotation Stretch    Keeping back flat and feet together, rotate knees to left side. Hold _10___ seconds. Repeat ___10_ times per set. Do __1-2__ sets per session. Do _2___ sessions per day.  http://orth.exer.us/123   Copyright  VHI. All rights reserved.

## 2015-07-12 ENCOUNTER — Encounter: Payer: Self-pay | Admitting: Physical Therapy

## 2015-07-14 ENCOUNTER — Ambulatory Visit: Payer: BC Managed Care – PPO | Attending: Orthopedic Surgery | Admitting: Physical Therapy

## 2015-07-14 ENCOUNTER — Encounter: Payer: Self-pay | Admitting: Physical Therapy

## 2015-07-14 DIAGNOSIS — M25552 Pain in left hip: Secondary | ICD-10-CM | POA: Diagnosis not present

## 2015-07-14 DIAGNOSIS — M25562 Pain in left knee: Secondary | ICD-10-CM | POA: Diagnosis present

## 2015-07-14 DIAGNOSIS — M6281 Muscle weakness (generalized): Secondary | ICD-10-CM | POA: Diagnosis present

## 2015-07-14 DIAGNOSIS — M25662 Stiffness of left knee, not elsewhere classified: Secondary | ICD-10-CM | POA: Diagnosis present

## 2015-07-14 NOTE — Therapy (Signed)
[place], [place], [address] Phone: [phone]   Fax:  [phone]  Physical Therapy Treatment  Patient Details  Name: [name] MRN: [id number] Date of Birth: Aug 07, 1960 Referring Provider: Dr. Henreitta Leber  Encounter Date: 07/14/2015      PT End of Session - 07/14/15 1026    Visit Number 2   Number of Visits 16   Date for PT Re-Evaluation 08/24/15   Authorization Type BCBS   PT Start Time 1017   PT Stop Time 1115   PT Time Calculation (min) 58 min      Past Medical History  Diagnosis Date  . Arthritis   . Hypertension     Past Surgical History  Procedure Laterality Date  . Carpal tunnel release    . Hernia repair    . Robotic assisted total hysterectomy N/A 10/07/2012    Procedure: ROBOTIC ASSISTED TOTAL HYSTERECTOMY;  Surgeon: Marvene Staff, MD;  Location: [facility];  Service: Gynecology;  Laterality: N/A;  . Laparoscopic bilateral salpingectomy Bilateral 10/07/2012    Procedure: LAPAROSCOPIC BILATERAL SALPINGECTOMY;  Surgeon: Marvene Staff, MD;  Location: [facility];  Service: Gynecology;  Laterality: Bilateral;  . Robotic assisted laparoscopic lysis of adhesion N/A 10/07/2012    Procedure: ROBOTIC ASSISTED LAPAROSCOPIC LYSIS OF ADHESION;  Surgeon: Marvene Staff, MD;  Location: [facility];  Service: Gynecology;  Laterality: N/A;    There were no vitals filed for this visit.  Visit Diagnosis:  Pain in left hip  Muscle weakness (generalized)  Pain in left knee  Stiffness of left knee, not elsewhere classified      Subjective Assessment - 07/14/15 1022    Subjective The hip pain is not as bad. I want to work on my quads and hamstrings.    Currently in Pain? Yes   Pain Score 5    Pain Location Hip   Pain Orientation Left   Pain Descriptors / Indicators Dull   Aggravating Factors  picking legs up to get in the the shower or car, sitting   Pain Relieving Factors pillows  and ice   Pain Score 4  or 5   Pain Location Knee   Pain Orientation Left   Pain Descriptors / Indicators Sore;Heaviness   Aggravating Factors  up walking   Pain Relieving Factors ice            [facility] PT Assessment - 07/14/15 0001    AROM   Left Knee Extension 0  supine   Left Knee Flexion 112                     [facility] Adult PT Treatment/Exercise - 07/14/15 1042    Lumbar Exercises: Stretches   Single Knee to Chest Stretch 3 reps;30 seconds   Lower Trunk Rotation 3 reps;30 seconds   Piriformis Stretch 2 reps;30 seconds   Piriformis Stretch Limitations modified knee to opp shoulder   Knee/Hip Exercises: Stretches   Active Hamstring Stretch Left;3 reps;30 seconds  HEP   Knee/Hip Exercises: Aerobic   Nustep L5 LE only x 5 min   Knee/Hip Exercises: Supine   Other Supine Knee/Hip Exercises clams x 10   Cryotherapy   Number Minutes Cryotherapy 15 Minutes   Cryotherapy Location Hip   Type of Cryotherapy Ice pack   Electrical Stimulation   Electrical Stimulation Location Left hip and glute   Electrical Stimulation Action IFC   Electrical Stimulation Parameters 59ma   Electrical Stimulation Goals  Pain   Manual Therapy   Manual Therapy Soft tissue mobilization   Soft tissue mobilization massage rollr to left glute in sidelying-very sensitive     Feet on physioball hamstring curls 10 x 2 supine             PT Short Term Goals - 07/14/15 1050    PT SHORT TERM GOAL #1   Title Pt will be able to stand with equal weightbearing for short periods and pain min.    Time 4   Period Weeks   Status On-going   PT SHORT TERM GOAL #2   Title Pt will be I with initial HEP for hip/back/knee   Time 4   Period Weeks   Status On-going   PT SHORT TERM GOAL #3   Title Pt will be able to sit more comfortably for 30 min, pain <3/10   Time 4   Period Weeks   Status On-going           PT Long Term Goals - 06/29/15 2230    PT LONG TERM GOAL #1   Title The  patient will be independent with safe self progression of HEP for further improvements in ROM and strength    Time 8   Period Weeks   Status New   PT LONG TERM GOAL #2   Title Pt will be able to return to walking with LRAD in the community with min limp as needed.    Time 8   Period Weeks   Status New   PT LONG TERM GOAL #3   Title Pt will be able to maintain knee flexion to 120 deg for normal gait mechanics   Time 8   Period Weeks   Status New   PT LONG TERM GOAL #4   Title Pt will score FOTO <45% impaired to show overall improvement   Time 8   Period Weeks   Status New   PT LONG TERM GOAL #5   Title Pt will be I with more advanced HEP    Time 8   Period Weeks   Status New               Plan - 07/14/15 1027    Clinical Impression Statement Pt reports minimal compliance with HEP. She did not feel ready to pull on her leg. Review of HEP with pt requiring moderate cues to complete. Pt very sensitive to touch left glutes. used gentle massage rolling to soften muscles and decrease sensitivity. Trial of estim with ice to left glute. Pt reports he felt good post session.    Pt will benefit from skilled therapeutic intervention in order to improve on the following deficits --  stiffness left hip, pain in left hip, pain in left knee, muscle weakness   PT Next Visit Plan continue hip stretches per HEP, manual and modalities, maybe ionto, assess benefit of IFC, reinforce HEP        Problem List Patient Active Problem List   Diagnosis Date Noted  . S/P hysterectomy - [facility]  10/07/2012  . Menorrhagia with fibroids 10/07/2012    Dorene Ar, PTA 07/14/2015, 11:42 AM  [place], [place], [address] Phone: [phone]   Fax:  [phone]  Name: [name] MRN: [id number] Date of Birth: 08-01-1960

## 2015-07-19 ENCOUNTER — Encounter: Payer: Self-pay | Admitting: Physical Therapy

## 2015-07-21 ENCOUNTER — Ambulatory Visit: Payer: BC Managed Care – PPO | Admitting: Physical Therapy

## 2015-07-26 ENCOUNTER — Encounter: Payer: Self-pay | Admitting: Physical Therapy

## 2015-07-28 ENCOUNTER — Encounter: Payer: Self-pay | Admitting: Physical Therapy

## 2015-07-28 ENCOUNTER — Ambulatory Visit: Payer: BC Managed Care – PPO | Admitting: Physical Therapy

## 2015-07-28 DIAGNOSIS — M6281 Muscle weakness (generalized): Secondary | ICD-10-CM

## 2015-07-28 DIAGNOSIS — M25662 Stiffness of left knee, not elsewhere classified: Secondary | ICD-10-CM

## 2015-07-28 DIAGNOSIS — M25552 Pain in left hip: Secondary | ICD-10-CM

## 2015-07-28 DIAGNOSIS — M25562 Pain in left knee: Secondary | ICD-10-CM

## 2015-07-28 NOTE — Therapy (Signed)
[place], [place], [address] Phone: [phone]   Fax:  [phone]  Physical Therapy Treatment  Patient Details  Name: [name] MRN: [id number] Date of Birth: July 18, 1960 Referring Provider: Dr. Henreitta Leber  Encounter Date: 07/28/2015      PT End of Session - 07/28/15 1045    Visit Number 3   Number of Visits 16   Date for PT Re-Evaluation 08/24/15   PT Start Time 1017   PT Stop Time 1115   PT Time Calculation (min) 58 min   Activity Tolerance Patient tolerated treatment well   Behavior During Therapy [facility] for tasks assessed/performed      Past Medical History  Diagnosis Date  . Arthritis   . Hypertension     Past Surgical History  Procedure Laterality Date  . Carpal tunnel release    . Hernia repair    . Robotic assisted total hysterectomy N/A 10/07/2012    Procedure: ROBOTIC ASSISTED TOTAL HYSTERECTOMY;  Surgeon: Marvene Staff, MD;  Location: [facility];  Service: Gynecology;  Laterality: N/A;  . Laparoscopic bilateral salpingectomy Bilateral 10/07/2012    Procedure: LAPAROSCOPIC BILATERAL SALPINGECTOMY;  Surgeon: Marvene Staff, MD;  Location: [facility];  Service: Gynecology;  Laterality: Bilateral;  . Robotic assisted laparoscopic lysis of adhesion N/A 10/07/2012    Procedure: ROBOTIC ASSISTED LAPAROSCOPIC LYSIS OF ADHESION;  Surgeon: Marvene Staff, MD;  Location: [facility];  Service: Gynecology;  Laterality: N/A;    There were no vitals filed for this visit.      Subjective Assessment - 07/28/15 1025    Subjective Pain is 5/10 today, knees stiff because of the rain coming.  Saw MD , did XR Lspine and knees. Therapy is getting expensive.    Diagnostic tests L spine: DDD and facet arthrits, knees with arthritis   Currently in Pain? Yes   Pain Score 5    Pain Location Back  hips to post thigh   Pain Orientation Right;Left  L>R   Pain Descriptors / Indicators Aching   Pain  Type Chronic pain   Pain Onset More than a month ago   Pain Frequency Intermittent   Aggravating Factors  sitting some days, driving alot   Pain Relieving Factors gentle exercise, some pain meds (tries not to use daily)                          [facility] Adult PT Treatment/Exercise - 07/28/15 1041    Self-Care   Other Self-Care Comments  given info on [employer] for water aerobics, HEP guidance and    Lumbar Exercises: Stretches   Active Hamstring Stretch 2 reps;30 seconds   Single Knee to Chest Stretch 3 reps;30 seconds   Lower Trunk Rotation 3 reps;30 seconds   Pelvic Tilt 5 reps   Pelvic Tilt Limitations max cues, tends to bridge   Piriformis Stretch 2 reps;30 seconds   Piriformis Stretch Limitations modified knee to opp shoulder   Lumbar Exercises: Sidelying   Clam 20 reps   Clam Limitations HEP   Hip Abduction 10 reps   Hip Abduction Weights (lbs) HEP, unable to do LLE   Knee/Hip Exercises: Seated   Sit to Sand 5 reps;without UE support  pain in knees   Moist Heat Therapy   Number Minutes Moist Heat 10 Minutes   Moist Heat Location Hip  PT Education - 07/28/15 1107    Education provided Yes   Education Details regular HEP for lasting improvement, GAC, HEP for hip strength    Person(s) Educated Patient   Methods Explanation;Demonstration;Tactile cues;Verbal cues;Handout   Comprehension Verbalized understanding;Returned demonstration          PT Short Term Goals - 07/28/15 1110    PT SHORT TERM GOAL #1   Title Pt will be able to stand with equal weightbearing for short periods and pain min.    Status Achieved   PT SHORT TERM GOAL #2   Title Pt will be I with initial HEP for hip/back/knee   Status On-going   PT SHORT TERM GOAL #3   Title Pt will be able to sit more comfortably for 30 min, pain <3/10   Status On-going           PT Long Term Goals - 07/28/15 1111    PT LONG TERM GOAL #1   Title The patient will be independent  with safe self progression of HEP for further improvements in ROM and strength    Status On-going   PT LONG TERM GOAL #2   Title Pt will be able to return to walking with LRAD in the community with min limp as needed.    Status Achieved   PT LONG TERM GOAL #3   Title Pt will be able to maintain knee flexion to 120 deg for normal gait mechanics   Status On-going   PT LONG TERM GOAL #4   Title Pt will score FOTO <45% impaired to show overall improvement   Status On-going   PT LONG TERM GOAL #5   Title Pt will be I with more advanced HEP    Status On-going               Plan - 07/28/15 1109    Clinical Impression Statement Pt limited today by knee pain (L).  Sidelying mat exercises increased pain and abduction inhibited by that pain.  She needs reinforcment for HEP and compliance due to reducting her frequency of attending PT sessions. Did report relief after MHP application.     PT Next Visit Plan continue hip stretches per HEP, manual and modalities, maybe ionto,reinforce HEP   PT Home Exercise Plan piriformis stretch and hamstring, LTR, added sidelying hip   Consulted and Agree with Plan of Care Patient      Patient will benefit from skilled therapeutic intervention in order to improve the following deficits and impairments:  Abnormal gait, Decreased range of motion, Difficulty walking, Increased fascial restricitons, Obesity, Decreased activity tolerance, Pain, Impaired flexibility, Decreased balance, Decreased mobility, Decreased strength, Increased edema, Postural dysfunction  Visit Diagnosis: Pain in left hip  Muscle weakness (generalized)  Pain in left knee  Stiffness of left knee, not elsewhere classified     Problem List Patient Active Problem List   Diagnosis Date Noted  . S/P hysterectomy - [facility]  10/07/2012  . Menorrhagia with fibroids 10/07/2012    Kataleah Bejar 07/28/2015, 11:17 AM  [place], [place], [address] Phone: [phone]   Fax:  [phone]  Name: Carmencita [place] MRN: [id number] Date of Birth: 02/15/1961   Raeford Razor, PT 07/28/2015 11:17 AM Phone: [phone] Fax: [phone]

## 2015-07-28 NOTE — Patient Instructions (Signed)
Abduction: Clam (Eccentric) - Side-Lying    Lie on side with knees bent. Lift top knee, keeping feet together. Keep trunk steady. Slowly lower for 3-5 seconds. _10-20__ reps per set, _1__ sets per day, _5-7__ days per week.   http://ecce.exer.us/65   Copyright  VHI. All rights reserved.   Abduction: Side Leg Lift (Eccentric) - Side-Lying    Lie on side. Lift top leg slightly higher than shoulder level. Keep top leg straight with body, toes pointing forward. Slowly lower for 3-5 seconds. 10___ reps per set, __1_ sets per day, __5-7_ days per week.   http://ecce.exer.us/63   Copyright  VHI. All rights reserved.

## 2015-08-04 ENCOUNTER — Ambulatory Visit: Payer: BC Managed Care – PPO | Admitting: Physical Therapy

## 2015-08-04 DIAGNOSIS — M25562 Pain in left knee: Secondary | ICD-10-CM

## 2015-08-04 DIAGNOSIS — M6281 Muscle weakness (generalized): Secondary | ICD-10-CM

## 2015-08-04 DIAGNOSIS — M25552 Pain in left hip: Secondary | ICD-10-CM | POA: Diagnosis not present

## 2015-08-04 DIAGNOSIS — M25662 Stiffness of left knee, not elsewhere classified: Secondary | ICD-10-CM

## 2015-08-04 NOTE — Therapy (Signed)
[place], [place], [address] Phone: [phone]   Fax:  [phone]  Physical Therapy Treatment  Patient Details  Name: [name] MRN: [id number] Date of Birth: [birth year] Referring Provider: Dr. Henreitta Leber  Encounter Date: 08/04/2015      PT End of Session - 08/04/15 0855    Visit Number 4   Number of Visits 16   Date for PT Re-Evaluation 08/24/15   Authorization Type BCBS   PT Start Time 0853   PT Stop Time 0946   PT Time Calculation (min) 53 min      Past Medical History  Diagnosis Date  . Arthritis   . Hypertension     Past Surgical History  Procedure Laterality Date  . Carpal tunnel release    . Hernia repair    . Robotic assisted total hysterectomy N/A 10/07/2012    Procedure: ROBOTIC ASSISTED TOTAL HYSTERECTOMY;  Surgeon: Marvene Staff, MD;  Location: [facility];  Service: Gynecology;  Laterality: N/A;  . Laparoscopic bilateral salpingectomy Bilateral 10/07/2012    Procedure: LAPAROSCOPIC BILATERAL SALPINGECTOMY;  Surgeon: Marvene Staff, MD;  Location: [facility];  Service: Gynecology;  Laterality: Bilateral;  . Robotic assisted laparoscopic lysis of adhesion N/A 10/07/2012    Procedure: ROBOTIC ASSISTED LAPAROSCOPIC LYSIS OF ADHESION;  Surgeon: Marvene Staff, MD;  Location: [facility];  Service: Gynecology;  Laterality: N/A;    There were no vitals filed for this visit.      Subjective Assessment - 08/04/15 0901    Subjective I have not been doing the exercises I was given here, I just do what feels good to me.    Currently in Pain? Yes   Pain Score 5    Pain Location Back  and anterior/posterior knees   Aggravating Factors  sitting prolonged    Pain Relieving Factors walking and moving            [facility] PT Assessment - 08/04/15 0001    AROM   Right Knee Flexion 122   Left Knee Flexion 122                     [facility] Adult PT Treatment/Exercise -  08/04/15 0001    Lumbar Exercises: Stretches   Active Hamstring Stretch 2 reps;30 seconds   Single Knee to Chest Stretch 3 reps;30 seconds   Lower Trunk Rotation 5 reps;10 seconds   Piriformis Stretch 3 reps;30 seconds   Piriformis Stretch Limitations knee to opp shoulder   Lumbar Exercises: Sidelying   Clam 20 reps  2 sets   Clam Limitations bilateral, then reverse clam on left only   Hip Abduction 10 reps  2 sets   Hip Abduction Weights (lbs) on right only, min ROM on left x 5   Knee/Hip Exercises: Supine   Straight Leg Raises 10 reps   Straight Leg Raises Limitations with ab set and quad set   Moist Heat Therapy   Number Minutes Moist Heat 15 Minutes   Moist Heat Location Hip                  PT Short Term Goals - 07/28/15 1110    PT SHORT TERM GOAL #1   Title Pt will be able to stand with equal weightbearing for short periods and pain min.    Status Achieved   PT SHORT TERM GOAL #2   Title Pt will be I with initial HEP for hip/back/knee  Status On-going   PT SHORT TERM GOAL #3   Title Pt will be able to sit more comfortably for 30 min, pain <3/10   Status On-going           PT Long Term Goals - 08/04/15 0315    PT LONG TERM GOAL #1   Title The patient will be independent with safe self progression of HEP for further improvements in ROM and strength    Time 8   Period Weeks   Status On-going   PT LONG TERM GOAL #2   Title Pt will be able to return to walking with LRAD in the community with min limp as needed.    Time 8   Period Weeks   Status Achieved   PT LONG TERM GOAL #3   Title Pt will be able to maintain knee flexion to 120 deg for normal gait mechanics   Baseline 122 bilateral   Time 8   Period Weeks   Status Partially Met   PT LONG TERM GOAL #4   Title Pt will score FOTO <45% impaired to show overall improvement   Time 8   Period Weeks   Status On-going   PT LONG TERM GOAL #5   Title Pt will be I with more advanced HEP    Time 8    Period Weeks   Status On-going               Plan - 08/04/15 9458    Clinical Impression Statement pt requires max cues for all HEP. She reports the MD thinks her posterior knee pain is due to arthritis in her back. She reports only performing exercises that she makes up on her own and not her PT issued exercsies. MD also gave her exercises which she does not complete. Review of HEP given since most revent eval with pt requiring max cues with all exercises. Better tolerance to left hip strengthening today in sidelying. Bilateral knee AROM 122, LTG# 3 partially MET.    PT Next Visit Plan continue hip stretches and strengthening  per HEP, manual and modalities, maybe ionto,reinforce HEP   PT Home Exercise Plan piriformis stretch and hamstring, LTR,  sidelying hip      Patient will benefit from skilled therapeutic intervention in order to improve the following deficits and impairments:  Abnormal gait, Decreased range of motion, Difficulty walking, Increased fascial restricitons, Obesity, Decreased activity tolerance, Pain, Impaired flexibility, Decreased balance, Decreased mobility, Decreased strength, Increased edema, Postural dysfunction  Visit Diagnosis: Pain in left hip  Muscle weakness (generalized)  Pain in left knee  Stiffness of left knee, not elsewhere classified     Problem List Patient Active Problem List   Diagnosis Date Noted  . S/P hysterectomy - [facility]inci  10/07/2012  . Menorrhagia with fibroids 10/07/2012    Dorene Ar, PTA 08/04/2015, 9:31 AM  [place], [place], [address] Phone: [phone]   Fax:  [phone]  Name: Zalaya Astarita MRN: [id number] Date of Birth: Jan 31, 1961

## 2015-08-05 ENCOUNTER — Ambulatory Visit: Payer: BC Managed Care – PPO | Admitting: Physical Therapy

## 2015-08-05 DIAGNOSIS — M25662 Stiffness of left knee, not elsewhere classified: Secondary | ICD-10-CM

## 2015-08-05 DIAGNOSIS — M25552 Pain in left hip: Secondary | ICD-10-CM | POA: Diagnosis not present

## 2015-08-05 DIAGNOSIS — M6281 Muscle weakness (generalized): Secondary | ICD-10-CM

## 2015-08-05 DIAGNOSIS — M25562 Pain in left knee: Secondary | ICD-10-CM

## 2015-08-05 NOTE — Addendum Note (Signed)
Addended by: Raeford Razor L on: 08/05/2015 10:12 AM   Modules accepted: Orders

## 2015-08-05 NOTE — Therapy (Addendum)
[place], [place], [address] Phone: [phone]   Fax:  [phone]  Physical Therapy Treatment / Discharge Note  Patient Details  Name: [name] MRN: [id number] Date of Birth: [birth year] Referring Provider: Dr. Henreitta Leber  Encounter Date: 08/05/2015      PT End of Session - 08/05/15 1026    Visit Number 5   Number of Visits 16   Date for PT Re-Evaluation 08/24/15   Authorization Type BCBS   PT Start Time 1017   PT Stop Time 1115   PT Time Calculation (min) 58 min      Past Medical History  Diagnosis Date  . Arthritis   . Hypertension     Past Surgical History  Procedure Laterality Date  . Carpal tunnel release    . Hernia repair    . Robotic assisted total hysterectomy N/A 10/07/2012    Procedure: ROBOTIC ASSISTED TOTAL HYSTERECTOMY;  Surgeon: Marvene Staff, MD;  Location: [facility];  Service: Gynecology;  Laterality: N/A;  . Laparoscopic bilateral salpingectomy Bilateral 10/07/2012    Procedure: LAPAROSCOPIC BILATERAL SALPINGECTOMY;  Surgeon: Marvene Staff, MD;  Location: [facility];  Service: Gynecology;  Laterality: Bilateral;  . Robotic assisted laparoscopic lysis of adhesion N/A 10/07/2012    Procedure: ROBOTIC ASSISTED LAPAROSCOPIC LYSIS OF ADHESION;  Surgeon: Marvene Staff, MD;  Location: [facility];  Service: Gynecology;  Laterality: N/A;    There were no vitals filed for this visit.      Subjective Assessment - 08/05/15 1025    Currently in Pain? Yes   Pain Score 5    Pain Location Knee  left buttock            [facility] PT Assessment - 08/05/15 0001    Standardized Balance Assessment   Standardized Balance Assessment Berg Balance Test   Berg Balance Test   Sit to Stand Able to stand  independently using hands   Standing Unsupported Able to stand safely 2 minutes   Sitting with Back Unsupported but Feet Supported on Floor or Stool Able to sit safely and  securely 2 minutes   Stand to Sit Controls descent by using hands   Transfers Able to transfer safely, definite need of hands   Standing Unsupported with Eyes Closed Able to stand 10 seconds safely   Standing Ubsupported with Feet Together Able to place feet together independently and stand 1 minute safely   From Standing, Reach Forward with Outstretched Arm Can reach confidently >25 cm (10")   From Standing Position, Pick up Object from Floor Able to pick up shoe safely and easily   From Standing Position, Turn to Look Behind Over each Shoulder Looks behind from both sides and weight shifts well   Turn 360 Degrees Able to turn 360 degrees safely in 4 seconds or less   Standing Unsupported, Alternately Place Feet on Step/Stool Able to stand independently and safely and complete 8 steps in 20 seconds   Standing Unsupported, One Foot in Front Able to place foot tandem independently and hold 30 seconds   Standing on One Leg Able to lift leg independently and hold 5-10 seconds   Total Score 52                     [facility] Adult PT Treatment/Exercise - 08/05/15 0001    Ambulation/Gait   Ambulation/Gait Yes   Ambulation/Gait Assistance 7: Independent   Ambulation Distance (Feet) 100 Feet   Assistive  device None   Gait Pattern Antalgic   Lumbar Exercises: Sidelying   Clam 20 reps   Clam Limitations left only after manual   Hip Abduction 10 reps   Hip Abduction Weights (lbs) left only, able to perform without pain after manual   Knee/Hip Exercises: Aerobic   Nustep L5 LE only x 5 min   Moist Heat Therapy   Number Minutes Moist Heat 15 Minutes   Moist Heat Location Hip   Manual Therapy   Soft tissue mobilization massage roller and massage ball to left glute in sidelying , sensitive to superficial pressure, decreased after massage                  PT Short Term Goals - 07/28/15 1110    PT SHORT TERM GOAL #1   Title Pt will be able to stand with equal weightbearing for  short periods and pain min.    Status Achieved   PT SHORT TERM GOAL #2   Title Pt will be I with initial HEP for hip/back/knee   Status On-going   PT SHORT TERM GOAL #3   Title Pt will be able to sit more comfortably for 30 min, pain <3/10   Status On-going           PT Long Term Goals - 08/04/15 0630    PT LONG TERM GOAL #1   Title The patient will be independent with safe self progression of HEP for further improvements in ROM and strength    Time 8   Period Weeks   Status On-going   PT LONG TERM GOAL #2   Title Pt will be able to return to walking with LRAD in the community with min limp as needed.    Time 8   Period Weeks   Status Achieved   PT LONG TERM GOAL #3   Title Pt will be able to maintain knee flexion to 120 deg for normal gait mechanics   Baseline 122 bilateral   Time 8   Period Weeks   Status Partially Met   PT LONG TERM GOAL #4   Title Pt will score FOTO <45% impaired to show overall improvement   Time 8   Period Weeks   Status On-going   PT LONG TERM GOAL #5   Title Pt will be I with more advanced HEP    Time 8   Period Weeks   Status On-going               Plan - 08/05/15 1148    Clinical Impression Statement Pt enters with concerns about continued use of SPC. BERG 52/56. Worked on gait deviations with pt able to decrease gait deviations with cues. Pt plans to ambulate more without SPC. Encouraged pt to not over do it. Pt's biggest concern is left buttock pain. She continues to be very sensitive to moderate pressure with manual. Tolerance to pressure improved after manual with massage tools and pt able to perfrom AROM abduction in sidelying after manual. She reports left hip exercises less painful.    PT Next Visit Plan continue hip stretches and strengthening  per HEP, manual and modalities, maybe ionto,reinforce HEP      Patient will benefit from skilled therapeutic intervention in order to improve the following deficits and impairments:   Abnormal gait, Decreased range of motion, Difficulty walking, Increased fascial restricitons, Obesity, Decreased activity tolerance, Pain, Impaired flexibility, Decreased balance, Decreased mobility, Decreased strength, Increased edema, Postural dysfunction  Visit Diagnosis:  No diagnosis found.     Problem List Patient Active Problem List   Diagnosis Date Noted  . S/P hysterectomy - [facility]inci  10/07/2012  . Menorrhagia with fibroids 10/07/2012    Dorene Ar, PTA 08/05/2015, 1:09 PM  [facility] [address] [place], [place], [address] Phone: [phone]   Fax:  [phone]  Name: Prairie Stenberg MRN: [id number] Date of Birth: Feb 26, 1961   PHYSICAL THERAPY DISCHARGE SUMMARY  Visits from Start of Care: 5  Current functional level related to goals / functional outcomes: See goals   Remaining deficits: Unknown    Education / Equipment: HEP,   Plan:                                                    Patient goals were partially met. Patient is being discharged due to not returning since the last visit.  ?????     Kristoffer Leamon PT, DPT, LAT, ATC  01/03/16  4:19 PM

## 2018-02-01 ENCOUNTER — Ambulatory Visit: Payer: BLUE CROSS/BLUE SHIELD | Admitting: Podiatry

## 2018-02-08 ENCOUNTER — Encounter: Payer: Self-pay | Admitting: Podiatry

## 2018-02-08 ENCOUNTER — Ambulatory Visit: Payer: BC Managed Care – PPO | Admitting: Podiatry

## 2018-02-08 ENCOUNTER — Other Ambulatory Visit: Payer: Self-pay | Admitting: Podiatry

## 2018-02-08 ENCOUNTER — Ambulatory Visit (INDEPENDENT_AMBULATORY_CARE_PROVIDER_SITE_OTHER): Payer: BC Managed Care – PPO

## 2018-02-08 VITALS — BP 170/93 | HR 80

## 2018-02-08 DIAGNOSIS — M216X9 Other acquired deformities of unspecified foot: Secondary | ICD-10-CM

## 2018-02-08 DIAGNOSIS — M2012 Hallux valgus (acquired), left foot: Secondary | ICD-10-CM

## 2018-02-08 DIAGNOSIS — M779 Enthesopathy, unspecified: Secondary | ICD-10-CM

## 2018-02-08 DIAGNOSIS — M2041 Other hammer toe(s) (acquired), right foot: Secondary | ICD-10-CM | POA: Diagnosis not present

## 2018-02-08 DIAGNOSIS — M778 Other enthesopathies, not elsewhere classified: Secondary | ICD-10-CM

## 2018-02-08 DIAGNOSIS — M2011 Hallux valgus (acquired), right foot: Secondary | ICD-10-CM

## 2018-02-08 NOTE — Patient Instructions (Signed)

## 2018-02-12 ENCOUNTER — Telehealth: Payer: Self-pay | Admitting: Podiatry

## 2018-02-12 NOTE — Telephone Encounter (Signed)
This is Engineer, civil (consulting) at [facility]. I need the office visit note from 01 November faxed over to [place]. The fax number is [phone]. Any questions, I can be reached at [phone].

## 2018-02-13 NOTE — Progress Notes (Signed)
Subjective:   Patient ID: [name], female   DOB: 57 y.o.   MRN: [id number]   HPI Patient presents stating nails doing well but I have got this bunion is really bother me on the right foot and also got a digit on the right foot that sore and make it hard to wear shoe gear.  Patient states that she is concerned about the loss of her nail.  Patient does not smoke likes to be active   Review of Systems  All other systems reviewed and are negative.       Objective:  Physical Exam  Constitutional: She appears well-developed and well-nourished.  Cardiovascular: Intact distal pulses.  Pulmonary/Chest: Effort normal.  Musculoskeletal: Normal range of motion.  Neurological: She is alert.  Skin: Skin is warm.  Nursing note and vitals reviewed.   Neurovascular status found to be intact muscle strength is adequate range of motion within normal limits with patient found to have hyperostosis medial aspect first metatarsal head right with redness around the joint with mild discomfort in the left also with keratotic lesion fifth digit bilateral.  Patient has good digital perfusion well oriented x3    Assessment:  Structural HAV deformity right with pain along with digital deformity fifth right with pain and nail disease which is stable at the current time     Plan:  H&P discussed conditions and reviewed structural bunion correction.  Patient wants to have this done understanding correction I explained distal osteotomy along with arthroplasty.  Patient will return for consult and will be scheduled for surgery in December and I did dispense air fracture walker today to reduce the stress on her foot.  Also want her to monitor her blood pressure was elevated today and she will do this at home and states it is typically not that high  X-ray indicates there is moderate deformity of the first metatarsal right upper left with elevation of the intermetatarsal angle with rotated fifth digits bilateral

## 2018-03-18 ENCOUNTER — Ambulatory Visit (INDEPENDENT_AMBULATORY_CARE_PROVIDER_SITE_OTHER): Payer: BC Managed Care – PPO | Admitting: Podiatry

## 2018-03-18 ENCOUNTER — Encounter: Payer: Self-pay | Admitting: Podiatry

## 2018-03-18 DIAGNOSIS — M2012 Hallux valgus (acquired), left foot: Secondary | ICD-10-CM

## 2018-03-18 DIAGNOSIS — M216X9 Other acquired deformities of unspecified foot: Secondary | ICD-10-CM

## 2018-03-18 DIAGNOSIS — M2041 Other hammer toe(s) (acquired), right foot: Secondary | ICD-10-CM

## 2018-03-18 NOTE — Patient Instructions (Signed)
Pre-Operative Instructions  Congratulations, you have decided to take an important step towards improving your quality of life.  You can be assured that the doctors and staff at [facility] will be with you every step of the way.  Here are some important things you should know:  1. Plan to be at the surgery center/hospital at least 1 (one) hour prior to your scheduled time, unless otherwise directed by the surgical center/hospital staff.  You must have a responsible adult accompany you, remain during the surgery and drive you home.  Make sure you have directions to the surgical center/hospital to ensure you arrive on time. 2. If you are having surgery at [facility] or [facility] hospitals, you will need a copy of your medical history and physical form from your family physician within one month prior to the date of surgery. We will give you a form for your primary physician to complete.  3. We make every effort to accommodate the date you request for surgery.  However, there are times where surgery dates or times have to be moved.  We will contact you as soon as possible if a change in schedule is required.   4. No aspirin/ibuprofen for one week before surgery.  If you are on aspirin, any non-steroidal anti-inflammatory medications (Mobic, Aleve, Ibuprofen) should not be taken seven (7) days prior to your surgery.  You make take Tylenol for pain prior to surgery.  5. Medications - If you are taking daily heart and blood pressure medications, seizure, reflux, allergy, asthma, anxiety, pain or diabetes medications, make sure you notify the surgery center/hospital before the day of surgery so they can tell you which medications you should take or avoid the day of surgery. 6. No food or drink after midnight the night before surgery unless directed otherwise by surgical center/hospital staff. 7. No alcoholic beverages 24-hours prior to surgery.  No smoking 24-hours prior or 24-hours after  surgery. 8. Wear loose pants or shorts. They should be loose enough to fit over bandages, boots, and casts. 9. Don't wear slip-on shoes. Sneakers are preferred. 10. Bring your boot with you to the surgery center/hospital.  Also bring crutches or a walker if your physician has prescribed it for you.  If you do not have this equipment, it will be provided for you after surgery. 11. If you have not been contacted by the surgery center/hospital by the day before your surgery, call to confirm the date and time of your surgery. 12. Leave-time from work may vary depending on the type of surgery you have.  Appropriate arrangements should be made prior to surgery with your employer. 13. Prescriptions will be provided immediately following surgery by your doctor.  Fill these as soon as possible after surgery and take the medication as directed. Pain medications will not be refilled on weekends and must be approved by the doctor. 14. Remove nail polish on the operative foot and avoid getting pedicures prior to surgery. 15. Wash the night before surgery.  The night before surgery wash the foot and leg well with water and the antibacterial soap provided. Be sure to pay special attention to beneath the toenails and in between the toes.  Wash for at least three (3) minutes. Rinse thoroughly with water and dry well with a towel.  Perform this wash unless told not to do so by your physician.  Enclosed: 1 Ice pack (please put in freezer the night before surgery)   1 Hibiclens skin cleaner     Pre-op instructions  If you have any questions regarding the instructions, please do not hesitate to call our office.  [place]: [address], [place], [place] [address] -- [phone]  [place]: [address] [place], [place] [address] -- [phone]  [place]: [address]  [place], [place] [address] -- [phone]  [place]: [address], [address], [place],  [address] -- [phone]  Website:  [email or web address] 

## 2018-03-20 NOTE — Progress Notes (Signed)
Subjective:   Patient ID: [name], female   DOB: 57 y.o.   MRN: [id number]   HPI Patient presents stating that she is ready for surgery on her foot and wants to go everything today   ROS      Objective:  Physical Exam  Neurovascular status intact with patient found to have structural bunion deformity with hammertoe deformity and chronic pain in the metatarsal bone second.     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  I allowed her to go over consent form after extensive review she signed it.  Patient wants surgery and at this time is scheduled for outpatient surgery and is given all preoperative instructions.  Patient understands no guarantee as far success of procedure and understands all risk factors associated with this and is willing to accept risk understanding recovery can take approximately 6 months.  Patient is scheduled for outpatient surgery and is given all preoperative instructions at this time and is encouraged to call with any questions prior to procedure

## 2018-03-26 ENCOUNTER — Encounter: Payer: Self-pay | Admitting: Podiatry

## 2018-03-26 DIAGNOSIS — M21541 Acquired clubfoot, right foot: Secondary | ICD-10-CM

## 2018-03-26 DIAGNOSIS — M2011 Hallux valgus (acquired), right foot: Secondary | ICD-10-CM | POA: Diagnosis not present

## 2018-03-26 DIAGNOSIS — M2041 Other hammer toe(s) (acquired), right foot: Secondary | ICD-10-CM | POA: Diagnosis not present

## 2018-04-01 ENCOUNTER — Encounter: Payer: Self-pay | Admitting: Podiatry

## 2018-04-01 ENCOUNTER — Ambulatory Visit (INDEPENDENT_AMBULATORY_CARE_PROVIDER_SITE_OTHER): Payer: BC Managed Care – PPO

## 2018-04-01 ENCOUNTER — Ambulatory Visit (INDEPENDENT_AMBULATORY_CARE_PROVIDER_SITE_OTHER): Payer: BC Managed Care – PPO | Admitting: Podiatry

## 2018-04-01 VITALS — BP 182/102 | HR 78

## 2018-04-01 DIAGNOSIS — M2011 Hallux valgus (acquired), right foot: Secondary | ICD-10-CM

## 2018-04-02 NOTE — Progress Notes (Signed)
Subjective:   Patient ID: [name], female   DOB: 57 y.o.   MRN: [id number]   HPI Patient states doing real well with surgery and very happy with how I am doing   ROS      Objective:  Physical Exam  Neurovascular status intact muscle strength adequate negative Homans sign noted with patient having high blood pressure that she is to check with her doctor patient's foot is healing well with wound edges coapted hallux in rectus position second toe in good alignment pin in place with good positional component noted     Assessment:  Doing well post forefoot reconstruction with good alignment wound edges well coapted     Plan:  H&P conditions reviewed sterile dressings reapplied advised on lowering the second toe elevation compression immobilization to be continued  X-rays indicate osteotomies are healing well fixation in place good alignment noted

## 2018-04-10 HISTORY — PX: BUNIONECTOMY: SHX129

## 2018-04-15 ENCOUNTER — Encounter: Payer: Self-pay | Admitting: Podiatry

## 2018-04-15 ENCOUNTER — Ambulatory Visit (INDEPENDENT_AMBULATORY_CARE_PROVIDER_SITE_OTHER): Payer: BC Managed Care – PPO

## 2018-04-15 ENCOUNTER — Ambulatory Visit (INDEPENDENT_AMBULATORY_CARE_PROVIDER_SITE_OTHER): Payer: BC Managed Care – PPO | Admitting: Podiatry

## 2018-04-15 DIAGNOSIS — M2011 Hallux valgus (acquired), right foot: Secondary | ICD-10-CM

## 2018-04-15 DIAGNOSIS — M2041 Other hammer toe(s) (acquired), right foot: Secondary | ICD-10-CM

## 2018-04-17 NOTE — Progress Notes (Signed)
Subjective:   Patient ID: [name], female   DOB: 58 y.o.   MRN: [id number]   HPI Patient states she continues to improve but she does have some swelling in the foot she wanted checked and has bumped it several times to a mild nature.  Pin is in place second toe and overall she is very pleased   ROS      Objective:  Physical Exam  Neurovascular status intact negative Homans sign is noted with patient's right foot healing well wound edges well coapted moderate dorsal swelling that is relatively normal for this.  With stitches intact and good alignment of the second toe with mild elevation     Assessment:  Overall doing well with good alignment of the second digit wound edges well coapted pin in place and structural bunion correction proceeding well     Plan:  H&P x-ray reviewed condition discussed and stitches removed.  Continue plantar flexing the second toe continue compression elevation immobilization and reappoint 2 weeks for pin removal or earlier if needed  X-rays indicate fixation is in place with no indication of trauma or bone injury with structural bunion correction good joint congruence

## 2018-05-01 ENCOUNTER — Encounter: Payer: Self-pay | Admitting: Podiatry

## 2018-05-01 ENCOUNTER — Ambulatory Visit (INDEPENDENT_AMBULATORY_CARE_PROVIDER_SITE_OTHER): Payer: BC Managed Care – PPO | Admitting: Podiatry

## 2018-05-01 ENCOUNTER — Ambulatory Visit (INDEPENDENT_AMBULATORY_CARE_PROVIDER_SITE_OTHER): Payer: BC Managed Care – PPO

## 2018-05-01 ENCOUNTER — Ambulatory Visit: Payer: BC Managed Care – PPO

## 2018-05-01 DIAGNOSIS — M2011 Hallux valgus (acquired), right foot: Secondary | ICD-10-CM

## 2018-05-01 DIAGNOSIS — M2041 Other hammer toe(s) (acquired), right foot: Secondary | ICD-10-CM

## 2018-05-01 NOTE — Progress Notes (Signed)
Subjective:   Patient ID: [name], female   DOB: 58 y.o.   MRN: [id number]   HPI Patient states doing real well with surgery and pin is in place and very pleased with how everything looks   ROS      Objective:  Physical Exam  Neurovascular status intact negative Homans sign noted with patient's right foot healing well with wound edges well coapted digit in good alignment pin in place second digit with good motion of the first MPJ     Assessment:  Doing well post forefoot reconstruction right with good alignment     Plan:  Pin removed second digit sterile dressing applied x-ray reviewed and discussed gradual return to soft shoe over the next 2 weeks and dispensed ankle compression stocking  X-ray indicates good alignment digit in good alignment fixation in place first metatarsal

## 2018-05-29 ENCOUNTER — Ambulatory Visit (INDEPENDENT_AMBULATORY_CARE_PROVIDER_SITE_OTHER): Payer: BC Managed Care – PPO

## 2018-05-29 ENCOUNTER — Ambulatory Visit (INDEPENDENT_AMBULATORY_CARE_PROVIDER_SITE_OTHER): Payer: BC Managed Care – PPO | Admitting: Podiatry

## 2018-05-29 ENCOUNTER — Encounter: Payer: Self-pay | Admitting: Podiatry

## 2018-05-29 DIAGNOSIS — M2011 Hallux valgus (acquired), right foot: Secondary | ICD-10-CM

## 2018-06-03 NOTE — Progress Notes (Signed)
Subjective:   Patient ID: [name], female   DOB: 58 y.o.   MRN: [id number]   HPI Patient states she is doing really well overall but still gets swelling if she is been on her foot for too long.  Patient states that she is back into a regular shoe but cannot wear all of her shoes at this time   ROS      Objective:  Physical Exam  Neurovascular status intact negative Homans sign noted with patient's right foot healing well with good alignment and no signs of pathological process.  Patient has good structure joint congruence with everything in good alignment     Assessment:  Overall doing well post forefoot reconstruction right with no indications of pathology     Plan:  Reviewed condition recommended the continuation of compression elevation and wider type shoes and reappoint for [place] to recheck again in 6 weeks or earlier if needed  X-ray indicates that the osteotomy is healing well good alignment is noted with fixation in place

## 2018-07-24 ENCOUNTER — Ambulatory Visit: Payer: BC Managed Care – PPO | Admitting: Podiatry

## 2018-07-24 ENCOUNTER — Encounter: Payer: Self-pay | Admitting: Podiatry

## 2018-07-24 ENCOUNTER — Other Ambulatory Visit: Payer: Self-pay

## 2018-07-24 ENCOUNTER — Ambulatory Visit (INDEPENDENT_AMBULATORY_CARE_PROVIDER_SITE_OTHER): Payer: BC Managed Care – PPO

## 2018-07-24 ENCOUNTER — Ambulatory Visit (INDEPENDENT_AMBULATORY_CARE_PROVIDER_SITE_OTHER): Payer: BC Managed Care – PPO | Admitting: Podiatry

## 2018-07-24 VITALS — Temp 97.5°F

## 2018-07-24 DIAGNOSIS — M2041 Other hammer toe(s) (acquired), right foot: Secondary | ICD-10-CM

## 2018-07-25 NOTE — Progress Notes (Signed)
Subjective:   Patient ID: [name], female   DOB: 58 y.o.   MRN: [id number]   HPI Patient presents stating that the right foot is doing really well with minimal discomfort and occasional swelling but overall very pleased   ROS      Objective:  Physical Exam  Neurovascular status intact with patient's right foot healing well wound edges well coapted hallux in rectus position good range of motion no crepitus of the joint     Assessment:  Doing well post forefoot reconstruction right     Plan:  H&P condition reviewed and recommended continuation of elevation compression and rigid bottom shoes.  Patient is discharged will be seen back as needed  X-rays indicate osteotomies healing well fixation in place

## 2018-10-24 ENCOUNTER — Ambulatory Visit (INDEPENDENT_AMBULATORY_CARE_PROVIDER_SITE_OTHER): Payer: BC Managed Care – PPO

## 2018-10-24 ENCOUNTER — Encounter: Payer: Self-pay | Admitting: Podiatry

## 2018-10-24 ENCOUNTER — Other Ambulatory Visit: Payer: Self-pay

## 2018-10-24 ENCOUNTER — Ambulatory Visit (INDEPENDENT_AMBULATORY_CARE_PROVIDER_SITE_OTHER): Payer: BC Managed Care – PPO | Admitting: Podiatry

## 2018-10-24 ENCOUNTER — Other Ambulatory Visit: Payer: Self-pay | Admitting: Podiatry

## 2018-10-24 VITALS — Temp 98.3°F

## 2018-10-24 DIAGNOSIS — M7662 Achilles tendinitis, left leg: Secondary | ICD-10-CM | POA: Diagnosis not present

## 2018-10-24 DIAGNOSIS — M2041 Other hammer toe(s) (acquired), right foot: Secondary | ICD-10-CM

## 2018-10-24 DIAGNOSIS — M7661 Achilles tendinitis, right leg: Secondary | ICD-10-CM

## 2018-10-24 DIAGNOSIS — M722 Plantar fascial fibromatosis: Secondary | ICD-10-CM | POA: Diagnosis not present

## 2018-10-24 NOTE — Progress Notes (Signed)
Subjective:   Patient ID: [name], female   DOB: 58 y.o.   MRN: [id number]   HPI Patient presents with severe pain in the heel of both feet stating that it started recently and not sure what happened with the left she stepped on some stones and really irritated   ROS      Objective:  Physical Exam  Neurovascular status intact with exquisite discomfort plantar aspect heel bilateral with inflammation fluid buildup     Assessment:  Acute plantar fasciitis bilateral     Plan:  H&P sterile prep and injected the plantar fascia bilateral 3 mg Kenalog 5 mg Xylocaine and applied fascial brace bilateral and instructed reduced activity and placed on diclofenac 75 mg twice daily  X-rays indicate plantar spur and good healing of the right foot secondary to structural bunion metatarsal correction with fixation in place

## 2018-10-24 NOTE — Patient Instructions (Signed)

## 2018-11-07 ENCOUNTER — Ambulatory Visit: Payer: BC Managed Care – PPO | Admitting: Podiatry

## 2018-11-11 ENCOUNTER — Ambulatory Visit: Payer: BC Managed Care – PPO | Admitting: Podiatry

## 2019-08-21 DIAGNOSIS — N289 Disorder of kidney and ureter, unspecified: Secondary | ICD-10-CM | POA: Insufficient documentation

## 2019-10-30 ENCOUNTER — Telehealth: Payer: Self-pay | Admitting: *Deleted

## 2019-10-30 NOTE — Telephone Encounter (Signed)
Pt presented in to office for assistance in application of plantar fascia brace on left foot. I instructed pt to remove the plantar elastic velcro strap entirely, the allow the 2 smaller velcro straps to remain on the felt ankle band, then fasten around the ankle with the sun decal at the back of the heel with the felt arch over the heel, then attach the elastic velcro band on the lateral outside of the foot and pull up under the foot and arch and attach at the medial arch. I told pt if the brace was comfortable it was on correctly, and she could pull the plantar band up to tight for more support or loosen for less and could move the band forward or backward for as needed for comfort. I encouraged pt to make an appt if no improvement. Pt states understanding.

## 2020-02-13 ENCOUNTER — Other Ambulatory Visit: Payer: Self-pay | Admitting: Surgery

## 2020-05-15 ENCOUNTER — Other Ambulatory Visit (HOSPITAL_COMMUNITY)
Admission: RE | Admit: 2020-05-15 | Discharge: 2020-05-15 | Disposition: A | Discharge status: Home / Self Care | Payer: BC Managed Care – PPO | Source: Ambulatory Visit | Attending: Surgery | Admitting: Surgery

## 2020-05-15 DIAGNOSIS — Z20822 Contact with and (suspected) exposure to covid-19: Secondary | ICD-10-CM | POA: Insufficient documentation

## 2020-05-15 DIAGNOSIS — Z01812 Encounter for preprocedural laboratory examination: Secondary | ICD-10-CM | POA: Insufficient documentation

## 2020-05-15 LAB — SARS CORONAVIRUS 2 (TAT 6-24 HRS): SARS Coronavirus 2: NEGATIVE

## 2020-05-17 ENCOUNTER — Encounter (HOSPITAL_BASED_OUTPATIENT_CLINIC_OR_DEPARTMENT_OTHER): Payer: Self-pay | Admitting: Surgery

## 2020-05-17 ENCOUNTER — Other Ambulatory Visit: Payer: Self-pay

## 2020-05-17 NOTE — Progress Notes (Signed)
Spoke w/ via phone for pre-op interview--- PT Lab needs dos---- Istat and EKG              Lab results------ no COVID test ------ done 05-15-2020 negative result in epic Arrive at ------- 0630 on  05-19-2020  NPO after MN NO Solid Food.  Clear liquids from MN until--- 0530 Medications to take morning of surgery ----- Norvasc Diabetic medication ----- n/a Patient Special Instructions ----- n/a Pre-Op special Istructions ----- n/a Patient verbalized understanding of instructions that were given at this phone interview. Patient denies shortness of breath, chest pain, fever, cough at this phone interview.

## 2020-05-18 NOTE — H&P (Signed)
[name]  Location: [facility] Patient #: [id number] DOB: 1960/04/28 Divorced / Language: English / Race: Black or African American Female   History of Present Illness ) The patient is a 60 year old female who presents with an incisional hernia.  Chief complaint: Incisional hernia  This is a 60 year old female referred for evaluation of an incisional hernia at the umbilicus. She had a previous open hernia repair in the 1990s. She then had a robotic-assisted hysterectomy in 2014. She noticed a hernia over the past several years. It causes local discomfort near the umbilicus and a bulge. She has no nausea, vomiting, or obstructive symptoms.   Past Surgical History  Foot Surgery  Right. Hysterectomy (not due to cancer) - Complete  Knee Surgery  Left. Ventral / Umbilical Hernia Surgery  Left.  Diagnostic Studies History Colonoscopy  1-5 years ago Mammogram  within last year Pap Smear  1-5 years ago  Allergies (Chemira Jones, CMA;  Aspirin 81 *ANALGESICS - NonNarcotic*  HYDROcodone-Acetaminophen *ANALGESICS - OPIOID*  Penicillins  Ibuprofen *ANALGESICS - ANTI-INFLAMMATORY*   Medication History (Chemira Jones, CMA;  amLODIPine Besylate (10MG  Tablet, Oral) Active. Lisinopril (40MG  Tablet, Oral) Active. Medications Reconciled  Social History Malachi Bonds, CMA;  Caffeine use  Carbonated beverages, Tea. No alcohol use  No drug use  Tobacco use  Never smoker.  Family History Malachi Bonds, CMA; 02/13/2020 10:48 AM) Alcohol Abuse  Father. Arthritis  Father, Mother. Hypertension  Father.  Pregnancy / Birth History Malachi Bonds, CMA; Age at menarche  12 years. Age of menopause  24-60 Gravida  3 Irregular periods  Maternal age  24-20 Para  3  Other Problems (Malachi Bonds, CMA;  Arthritis  High blood pressure     Review of Systems (Chemira Jones CMA;  General Present- Weight Gain. Not Present- Appetite Loss,  Chills, Fatigue, Fever, Night Sweats and Weight Loss. Skin Not Present- Change in Wart/Mole, Dryness, Hives, Jaundice, New Lesions, Non-Healing Wounds, Rash and Ulcer. HEENT Not Present- Earache, Hearing Loss, Hoarseness, Nose Bleed, Oral Ulcers, Ringing in the Ears, Seasonal Allergies, Sinus Pain, Sore Throat, Visual Disturbances, Wears glasses/contact lenses and Yellow Eyes. Respiratory Not Present- Bloody sputum, Chronic Cough, Difficulty Breathing, Snoring and Wheezing. Breast Not Present- Breast Mass, Breast Pain, Nipple Discharge and Skin Changes. Cardiovascular Not Present- Chest Pain, Difficulty Breathing Lying Down, Leg Cramps, Palpitations, Rapid Heart Rate, Shortness of Breath and Swelling of Extremities. Gastrointestinal Present- Abdominal Pain. Not Present- Bloating, Bloody Stool, Change in Bowel Habits, Chronic diarrhea, Constipation, Difficulty Swallowing, Excessive gas, Gets full quickly at meals, Hemorrhoids, Indigestion, Nausea, Rectal Pain and Vomiting. Female Genitourinary Not Present- Frequency, Nocturia, Painful Urination, Pelvic Pain and Urgency. Musculoskeletal Present- Joint Pain. Not Present- Back Pain, Joint Stiffness, Muscle Pain, Muscle Weakness and Swelling of Extremities. Neurological Not Present- Decreased Memory, Fainting, Headaches, Numbness, Seizures, Tingling, Tremor, Trouble walking and Weakness. Psychiatric Not Present- Anxiety, Bipolar, Change in Sleep Pattern, Depression, Fearful and Frequent crying. Endocrine Not Present- Cold Intolerance, Excessive Hunger, Hair Changes, Heat Intolerance, Hot flashes and New Diabetes. Hematology Not Present- Blood Thinners, Easy Bruising, Excessive bleeding, Gland problems, HIV and Persistent Infections.  Vitals   Weight: 272.8 lb Height: 67in Body Surface Area: 2.31 m Body Mass Index: 42.73 kg/m  Pulse: 98 (Regular)  BP: 132/82(Sitting, Left Arm, Standard)     Physical Exam  The physical exam findings are  as follows: Note: On exam, she appears well  Her abdomen is soft. It is morbidly obese. There are well-healed incisions. There  is a chronically incarcerated hernia just inferior to the right of the umbilicus. I cannot reduce it. It is mildly tender. I cannot feel the fascial defect.    Assessment & Plan  INCISIONAL HERNIA, INCARCERATED (K43.0)  Impression: I reviewed the patients notes in the electronic medical records. She has a chronically incarcerated incisional hernia. I discussed the diagnosis with her in detail. We discussed hernia repair. We discussed use of mesh for hernia repair. She wants to have her hernia fixed but is adamant that mesh not be used as she knows people who have had complications with mesh. I explained the minimal likelihood of problems with the mesh that we used. I explained the high risk of recurrence of the hernia with only a primary repair especially given her obesity. Nonetheless, she still wishes to proceed with an open hernia repair without mesh. I again discussed procedure with her in detail. I discussed the risks of bleeding, infection, injury to surrounding structures, hernia recurrence, cardiopulmonary issues, postoperative recovery, etc. She understands and wished to proceed with surgery which will be scheduled.

## 2020-05-19 ENCOUNTER — Encounter (HOSPITAL_BASED_OUTPATIENT_CLINIC_OR_DEPARTMENT_OTHER): Payer: Self-pay | Admitting: Surgery

## 2020-05-19 ENCOUNTER — Ambulatory Visit (HOSPITAL_BASED_OUTPATIENT_CLINIC_OR_DEPARTMENT_OTHER): Payer: BC Managed Care – PPO | Admitting: Certified Registered"

## 2020-05-19 ENCOUNTER — Encounter (HOSPITAL_BASED_OUTPATIENT_CLINIC_OR_DEPARTMENT_OTHER)
Admission: RE | Disposition: A | Discharge status: Home / Self Care | Payer: Self-pay | Source: Home / Self Care | Attending: Surgery

## 2020-05-19 ENCOUNTER — Other Ambulatory Visit: Payer: Self-pay

## 2020-05-19 ENCOUNTER — Ambulatory Visit (HOSPITAL_BASED_OUTPATIENT_CLINIC_OR_DEPARTMENT_OTHER)
Admission: RE | Admit: 2020-05-19 | Discharge: 2020-05-19 | Disposition: A | Discharge status: Home / Self Care | Payer: BC Managed Care – PPO | Attending: Surgery | Admitting: Surgery

## 2020-05-19 DIAGNOSIS — Z6841 Body Mass Index (BMI) 40.0 and over, adult: Secondary | ICD-10-CM | POA: Insufficient documentation

## 2020-05-19 DIAGNOSIS — Z886 Allergy status to analgesic agent status: Secondary | ICD-10-CM | POA: Diagnosis not present

## 2020-05-19 DIAGNOSIS — K430 Incisional hernia with obstruction, without gangrene: Secondary | ICD-10-CM | POA: Diagnosis not present

## 2020-05-19 DIAGNOSIS — Z90710 Acquired absence of both cervix and uterus: Secondary | ICD-10-CM | POA: Diagnosis not present

## 2020-05-19 DIAGNOSIS — Z885 Allergy status to narcotic agent status: Secondary | ICD-10-CM | POA: Diagnosis not present

## 2020-05-19 DIAGNOSIS — Z880 Allergy status to penicillin: Secondary | ICD-10-CM | POA: Insufficient documentation

## 2020-05-19 HISTORY — DX: Unspecified osteoarthritis, unspecified site: M19.90

## 2020-05-19 HISTORY — DX: Incisional hernia without obstruction or gangrene: K43.2

## 2020-05-19 HISTORY — PX: INCISIONAL HERNIA REPAIR: SHX193

## 2020-05-19 LAB — POCT I-STAT, CHEM 8
BUN: 13 mg/dL (ref 6–20)
Calcium, Ion: 1.34 mmol/L (ref 1.15–1.40)
Chloride: 102 mmol/L (ref 98–111)
Creatinine, Ser: 1 mg/dL (ref 0.44–1.00)
Glucose, Bld: 109 mg/dL — ABNORMAL HIGH (ref 70–99)
HCT: 38 % (ref 36.0–46.0)
Hemoglobin: 12.9 g/dL (ref 12.0–15.0)
Potassium: 4.2 mmol/L (ref 3.5–5.1)
Sodium: 140 mmol/L (ref 135–145)
TCO2: 27 mmol/L (ref 22–32)

## 2020-05-19 SURGERY — REPAIR, HERNIA, INCISIONAL
Anesthesia: General | Site: Abdomen

## 2020-05-19 MED ORDER — LIDOCAINE HCL (PF) 2 % IJ SOLN
INTRAMUSCULAR | Status: AC
  Filled 2020-05-19: qty 5

## 2020-05-19 MED ORDER — ACETAMINOPHEN 500 MG PO TABS
1000.0000 mg | ORAL_TABLET | ORAL | Status: AC
  Administered 2020-05-19: 1000 mg via ORAL

## 2020-05-19 MED ORDER — AMISULPRIDE (ANTIEMETIC) 5 MG/2ML IV SOLN
10.0000 mg | Freq: Once | INTRAVENOUS | Status: AC | PRN
  Administered 2020-05-19: 10 mg via INTRAVENOUS

## 2020-05-19 MED ORDER — DEXAMETHASONE SODIUM PHOSPHATE 10 MG/ML IJ SOLN
INTRAMUSCULAR | Status: AC
  Filled 2020-05-19: qty 1

## 2020-05-19 MED ORDER — TRAMADOL HCL 50 MG PO TABS: 50.0000 mg | ORAL_TABLET | Freq: Four times a day (QID) | ORAL | 0 refills | Status: DC | PRN

## 2020-05-19 MED ORDER — FENTANYL CITRATE (PF) 100 MCG/2ML IJ SOLN
25.0000 ug | INTRAMUSCULAR | Status: DC | PRN
  Administered 2020-05-19: 50 ug via INTRAVENOUS

## 2020-05-19 MED ORDER — GABAPENTIN 300 MG PO CAPS
ORAL_CAPSULE | ORAL | Status: AC
  Filled 2020-05-19: qty 1

## 2020-05-19 MED ORDER — LACTATED RINGERS IV SOLN: INTRAVENOUS | Status: DC

## 2020-05-19 MED ORDER — FENTANYL CITRATE (PF) 100 MCG/2ML IJ SOLN
INTRAMUSCULAR | Status: AC
  Filled 2020-05-19: qty 2

## 2020-05-19 MED ORDER — PROPOFOL 10 MG/ML IV BOLUS
INTRAVENOUS | Status: DC | PRN
  Administered 2020-05-19: 150 mg via INTRAVENOUS

## 2020-05-19 MED ORDER — MIDAZOLAM HCL 2 MG/2ML IJ SOLN
INTRAMUSCULAR | Status: AC
  Filled 2020-05-19: qty 2

## 2020-05-19 MED ORDER — ONDANSETRON HCL 4 MG/2ML IJ SOLN: 4.0000 mg | Freq: Once | INTRAMUSCULAR | Status: DC | PRN

## 2020-05-19 MED ORDER — ONDANSETRON HCL 4 MG/2ML IJ SOLN
INTRAMUSCULAR | Status: AC
  Filled 2020-05-19: qty 2

## 2020-05-19 MED ORDER — PROPOFOL 10 MG/ML IV BOLUS
INTRAVENOUS | Status: AC
  Filled 2020-05-19: qty 20

## 2020-05-19 MED ORDER — BUPIVACAINE-EPINEPHRINE 0.5% -1:200000 IJ SOLN
INTRAMUSCULAR | Status: DC | PRN
  Administered 2020-05-19 (×2): 10 mL

## 2020-05-19 MED ORDER — CHLORHEXIDINE GLUCONATE CLOTH 2 % EX PADS: 6.0000 | MEDICATED_PAD | Freq: Once | CUTANEOUS | Status: DC

## 2020-05-19 MED ORDER — GABAPENTIN 300 MG PO CAPS
300.0000 mg | ORAL_CAPSULE | ORAL | Status: AC
  Administered 2020-05-19: 300 mg via ORAL

## 2020-05-19 MED ORDER — ACETAMINOPHEN 500 MG PO TABS
ORAL_TABLET | ORAL | Status: AC
  Filled 2020-05-19: qty 2

## 2020-05-19 MED ORDER — ONDANSETRON HCL 4 MG/2ML IJ SOLN
INTRAMUSCULAR | Status: DC | PRN
  Administered 2020-05-19: 4 mg via INTRAVENOUS

## 2020-05-19 MED ORDER — CIPROFLOXACIN IN D5W 400 MG/200ML IV SOLN
400.0000 mg | INTRAVENOUS | Status: AC
  Administered 2020-05-19: 400 mg via INTRAVENOUS

## 2020-05-19 MED ORDER — LIDOCAINE 2% (20 MG/ML) 5 ML SYRINGE
INTRAMUSCULAR | Status: DC | PRN
  Administered 2020-05-19: 100 mg via INTRAVENOUS

## 2020-05-19 MED ORDER — CIPROFLOXACIN IN D5W 400 MG/200ML IV SOLN
INTRAVENOUS | Status: AC
  Filled 2020-05-19: qty 200

## 2020-05-19 MED ORDER — FENTANYL CITRATE (PF) 250 MCG/5ML IJ SOLN
INTRAMUSCULAR | Status: DC | PRN
  Administered 2020-05-19 (×4): 25 ug via INTRAVENOUS

## 2020-05-19 MED ORDER — OXYCODONE HCL 5 MG PO TABS
ORAL_TABLET | ORAL | Status: AC
  Filled 2020-05-19: qty 1

## 2020-05-19 MED ORDER — ENSURE PRE-SURGERY PO LIQD: 296.0000 mL | Freq: Once | ORAL | Status: DC

## 2020-05-19 MED ORDER — AMISULPRIDE (ANTIEMETIC) 5 MG/2ML IV SOLN
INTRAVENOUS | Status: AC
  Filled 2020-05-19: qty 4

## 2020-05-19 MED ORDER — DEXAMETHASONE SODIUM PHOSPHATE 10 MG/ML IJ SOLN
INTRAMUSCULAR | Status: DC | PRN
  Administered 2020-05-19: 10 mg via INTRAVENOUS

## 2020-05-19 MED ORDER — MIDAZOLAM HCL 5 MG/5ML IJ SOLN
INTRAMUSCULAR | Status: DC | PRN
  Administered 2020-05-19: 2 mg via INTRAVENOUS

## 2020-05-19 MED ORDER — ROCURONIUM BROMIDE 10 MG/ML (PF) SYRINGE
PREFILLED_SYRINGE | INTRAVENOUS | Status: AC
  Filled 2020-05-19: qty 10

## 2020-05-19 MED ORDER — OXYCODONE HCL 5 MG/5ML PO SOLN
5.0000 mg | Freq: Once | ORAL | Status: AC | PRN
Start: 2020-05-19 — End: 2020-05-19

## 2020-05-19 MED ORDER — OXYCODONE HCL 5 MG PO TABS
5.0000 mg | ORAL_TABLET | Freq: Once | ORAL | Status: AC | PRN
Start: 2020-05-19 — End: 2020-05-19
  Administered 2020-05-19: 5 mg via ORAL

## 2020-05-19 SURGICAL SUPPLY — 49 items
ADH SKN CLS APL DERMABOND .7 (GAUZE/BANDAGES/DRESSINGS) ×1
APL PRP STRL LF DISP 70% ISPRP (MISCELLANEOUS) ×1
BLADE CLIPPER SENSICLIP SURGIC (BLADE) IMPLANT
BLADE HEX COATED 2.75 (ELECTRODE) IMPLANT
BLADE SURG 15 STRL LF DISP TIS (BLADE) ×1 IMPLANT
BLADE SURG 15 STRL SS (BLADE) ×2
CANISTER SUCT 1200ML W/VALVE (MISCELLANEOUS) IMPLANT
CHLORAPREP W/TINT 26 (MISCELLANEOUS) ×2 IMPLANT
COVER BACK TABLE 60X90IN (DRAPES) ×2 IMPLANT
COVER MAYO STAND STRL (DRAPES) ×2 IMPLANT
COVER WAND RF STERILE (DRAPES) ×2 IMPLANT
DECANTER SPIKE VIAL GLASS SM (MISCELLANEOUS) ×2 IMPLANT
DERMABOND ADVANCED (GAUZE/BANDAGES/DRESSINGS) ×1
DERMABOND ADVANCED .7 DNX12 (GAUZE/BANDAGES/DRESSINGS) ×1 IMPLANT
DRAPE LAPAROTOMY 100X72 PEDS (DRAPES) ×2 IMPLANT
DRAPE UTILITY XL STRL (DRAPES) ×2 IMPLANT
DRSG TEGADERM 2-3/8X2-3/4 SM (GAUZE/BANDAGES/DRESSINGS) IMPLANT
ELECT REM PT RETURN 9FT ADLT (ELECTROSURGICAL) ×2
ELECTRODE REM PT RTRN 9FT ADLT (ELECTROSURGICAL) ×1 IMPLANT
GLOVE SURG SS PI 6.5 STRL IVOR (GLOVE) ×4 IMPLANT
GLOVE SURG SS PI 7.0 STRL IVOR (GLOVE) ×4 IMPLANT
GLOVE SURG SS PI 8.5 STRL IVOR (GLOVE) ×1
GLOVE SURG SS PI 8.5 STRL STRW (GLOVE) ×1 IMPLANT
GLOVE SURG UNDER POLY LF SZ7 (GLOVE) ×4 IMPLANT
GOWN STRL REUS W/ TWL LRG LVL3 (GOWN DISPOSABLE) ×2 IMPLANT
GOWN STRL REUS W/TWL LRG LVL3 (GOWN DISPOSABLE) ×8 IMPLANT
KIT TURNOVER CYSTO (KITS) ×2 IMPLANT
LIGASURE IMPACT 36 18CM CVD LR (INSTRUMENTS) ×2 IMPLANT
MANIFOLD NEPTUNE II (INSTRUMENTS) ×2 IMPLANT
MESH VENTRALEX ST 2.5 CRC MED (Mesh General) ×2 IMPLANT
NEEDLE HYPO 25X1 1.5 SAFETY (NEEDLE) ×2 IMPLANT
NS IRRIG 1000ML POUR BTL (IV SOLUTION) IMPLANT
NS IRRIG 500ML POUR BTL (IV SOLUTION) ×2 IMPLANT
PACK BASIN DAY SURGERY FS (CUSTOM PROCEDURE TRAY) ×2 IMPLANT
PENCIL SMOKE EVACUATOR (MISCELLANEOUS) ×2 IMPLANT
SLEEVE SCD COMPRESS KNEE MED (MISCELLANEOUS) ×2 IMPLANT
SPONGE LAP 4X18 RFD (DISPOSABLE) ×2 IMPLANT
SUT MNCRL AB 4-0 PS2 18 (SUTURE) ×2 IMPLANT
SUT NOVA 0 T19/GS 22DT (SUTURE) ×4 IMPLANT
SUT NOVA NAB DX-16 0-1 5-0 T12 (SUTURE) ×4 IMPLANT
SUT NOVA NAB GS-21 1 T12 (SUTURE) IMPLANT
SUT VIC AB 2-0 SH 27 (SUTURE)
SUT VIC AB 2-0 SH 27XBRD (SUTURE) IMPLANT
SUT VIC AB 3-0 SH 27 (SUTURE) ×2
SUT VIC AB 3-0 SH 27X BRD (SUTURE) ×1 IMPLANT
SYR CONTROL 10ML LL (SYRINGE) ×2 IMPLANT
TOWEL OR 17X26 10 PK STRL BLUE (TOWEL DISPOSABLE) ×2 IMPLANT
TUBE CONNECTING 12X1/4 (SUCTIONS) ×2 IMPLANT
YANKAUER SUCT BULB TIP NO VENT (SUCTIONS) ×2 IMPLANT

## 2020-05-19 NOTE — Anesthesia Postprocedure Evaluation (Signed)
Anesthesia Post Note  Patient: [name]  Procedure(s) Performed: OPEN HERNIA REPAIR INCISIONAL (N/A Abdomen)     Patient location during evaluation: PACU Anesthesia Type: General Level of consciousness: awake and alert Pain management: pain level controlled Vital Signs Assessment: post-procedure vital signs reviewed and stable Respiratory status: spontaneous breathing, nonlabored ventilation and respiratory function stable Cardiovascular status: blood pressure returned to baseline and stable Postop Assessment: no apparent nausea or vomiting Anesthetic complications: no   No complications documented.  Last Vitals:  Vitals:   05/19/20 1015 05/19/20 1030  BP: (!) 111/54 (!) 100/53  Pulse: 72 78  Resp: (!) 8 10  Temp:    SpO2: 98% 100%    Last Pain:  Vitals:   05/19/20 1000  TempSrc:   PainSc: 10-Worst pain ever                 Lidia Collum

## 2020-05-19 NOTE — Interval H&P Note (Signed)
History and Physical Interval Note:no change in H and P  05/19/2020 8:03 AM  [name]  has presented today for surgery, with the diagnosis of INCISIONAL HERNIA.  The various methods of treatment have been discussed with the patient and family. After consideration of risks, benefits and other options for treatment, the patient has consented to  Procedure(s): OPEN HERNIA REPAIR INCISIONAL (N/A) as a surgical intervention.  The patient's history has been reviewed, patient examined, no change in status, stable for surgery.  I have reviewed the patient's chart and labs.  Questions were answered to the patient's satisfaction.     Coralie Keens

## 2020-05-19 NOTE — Discharge Instructions (Signed)
[facility] _______[facility]  UMBILICAL OR INGUINAL HERNIA REPAIR: POST OP INSTRUCTIONS  Always review your discharge instruction sheet given to you by the facility where your surgery was performed. IF YOU HAVE DISABILITY OR FAMILY LEAVE FORMS, YOU MUST BRING THEM TO THE OFFICE FOR PROCESSING.   DO NOT GIVE THEM TO YOUR DOCTOR.  1. A  prescription for pain medication may be given to you upon discharge.  Take your pain medication as prescribed, if needed.  If narcotic pain medicine is not needed, then you may take acetaminophen (Tylenol) or ibuprofen (Advil) as needed. 2. Take your usually prescribed medications unless otherwise directed. If you need a refill on your pain medication, please contact your pharmacy.  They will contact our office to request authorization. Prescriptions will not be filled after 5 pm or on week-ends. 3. You should follow a light diet the first 24 hours after arrival home, such as soup and crackers, etc.  Be sure to include lots of fluids daily.  Resume your normal diet the day after surgery. 4.Most patients will experience some swelling and bruising around the umbilicus or in the groin and scrotum.  Ice packs and reclining will help.  Swelling and bruising can take several days to resolve.  6. It is common to experience some constipation if taking pain medication after surgery.  Increasing fluid intake and taking a stool softener (such as Colace) will usually help or prevent this problem from occurring.  A mild laxative (Milk of Magnesia or Miralax) should be taken according to package directions if there are no bowel movements after 48 hours. 7. Unless discharge instructions indicate otherwise, you may remove your bandages 24-48 hours after surgery, and you may shower at that time.  You may have steri-strips (small skin tapes) in place directly over the incision.  These strips should be left on the skin for 7-10 days.  If your surgeon used skin glue on the  incision, you may shower in 24 hours.  The glue will flake off over the next 2-3 weeks.  Any sutures or staples will be removed at the office during your follow-up visit. 8. ACTIVITIES:  You may resume regular (light) daily activities beginning the next day--such as daily self-care, walking, climbing stairs--gradually increasing activities as tolerated.  You may have sexual intercourse when it is comfortable.  Refrain from any heavy lifting or straining until approved by your doctor.  a.You may drive when you are no longer taking prescription pain medication, you can comfortably wear a seatbelt, and you can safely maneuver your car and apply brakes. b.RETURN TO WORK:   _____________________________________________  9.You should see your doctor in the office for a follow-up appointment approximately 2-3 weeks after your surgery.  Make sure that you call for this appointment within a day or two after you arrive home to insure a convenient appointment time. 10.OTHER INSTRUCTIONS: ____ok to shower starting tomorrow' Ice pack and tylenol also for pain No lifting more than 15 pounds for 4 weeks_____________________    _____________________________________  WHEN TO CALL YOUR DOCTOR: 1. Fever over 101.0 2. Inability to urinate 3. Nausea and/or vomiting 4. Extreme swelling or bruising 5. Continued bleeding from incision. 6. Increased pain, redness, or drainage from the incision  The clinic staff is available to answer your questions during regular business hours.  Please don't hesitate to call and ask to speak to one of the nurses for clinical concerns.  If you have a medical emergency, go to the nearest emergency  room or call 911.  A surgeon from [facility] is always on call at the hospital   [address] [place], [place], [place]  [address] ?  [address], [place], [place]   [address] [phone] ? [phone] ? FAX [phone] Web site:  [email or web address]     Post Anesthesia Home Care Instructions  Activity: Get plenty of rest for the remainder of the day. A responsible individual must stay with you for 24 hours following the procedure.  For the next 24 hours, DO NOT: -Drive a car -Paediatric nurse -Drink alcoholic beverages -Take any medication unless instructed by your physician -Make any legal decisions or sign important papers.  Meals: Start with liquid foods such as gelatin or soup. Progress to regular foods as tolerated. Avoid greasy, spicy, heavy foods. If nausea and/or vomiting occur, drink only clear liquids until the nausea and/or vomiting subsides. Call your physician if vomiting continues.  Special Instructions/Symptoms: Your throat may feel dry or sore from the anesthesia or the breathing tube placed in your throat during surgery. If this causes discomfort, gargle with warm salt water. The discomfort should disappear within 24 hours.

## 2020-05-19 NOTE — Op Note (Signed)
PATIENT: [name] 60 y.o. female  PRE-OPERATIVE DIAGNOSIS: Incisional hernia   POST-OPERATIVE DIAGNOSIS:  Incisional hernia (fat-containing)  PROCEDURE:  Incisional hernia repair with mesh  SURGEON: Surgeon(s) and Role: *BLACKMAN, DOUGLAS A, MD  ASSISTANTS:Andrius Andrepont, MD  ANESTHESIA:LMA  EBL: 5 mL   BLOOD ADMINISTERED: None  DRAINS:None  LOCAL MEDICATIONS USED:  Marcaine 0.5% with epinephrine  SPECIMEN: Hernia sac  DISPOSITION OF SPECIMEN:PATHOLOGY  COUNTS:Sponge an instrument count was correct  DICTATION:  The patient was taken to the operating and placed in the supine position in the operating room table. Pneumatic compression stockings were placed in bilateral lower extremities.The patient was prepped and draped in the usual sterile fashion. A time out was performed per hospital's policy. A 5 cm vertical infraumbilical incision was performed. Dissection was carried down to the fascia with bovie electrocautery. The fascial defect was 2 cm in diameter. Omental fat was encountered through the hernia defect. The fat was dissected free from the hernia sac and amputated with the Ligasure. The hernia sac was dissected off the hernia defect. A circular - 6.4 cm in diameter - Bard Ventralight ST Mesh was secured in an underlay fashion to the fascia with four 1-0 novafil sutures. Three 1-0 novafil interrupted figure-of-eight sutures were used to approximated the fascial defect on top of the mesh. The subcutaneous tissues were approximated with three 3-0 vicryl sutures. The skin was then closed with a subcuticular continuous 4-0 Monocryl and Dermabond skin glue.  The patient was awaken and taken to the recovery room in stable condition.  PLAN OF CARE:Discharge to home after PACU  PATIENT DISPOSITION:PACU - hemodynamically stable.  Delay start of Pharmacological VTE agent (>24hrs) due to surgical blood loss or risk of  bleeding:not applicable

## 2020-05-19 NOTE — Anesthesia Procedure Notes (Signed)
Procedure Name: LMA Insertion Date/Time: 05/19/2020 8:34 AM Performed by: Myna Bright, CRNA Pre-anesthesia Checklist: Patient identified, Emergency Drugs available, Suction available and Patient being monitored Patient Re-evaluated:Patient Re-evaluated prior to induction Oxygen Delivery Method: Circle system utilized Preoxygenation: Pre-oxygenation with 100% oxygen Induction Type: IV induction Ventilation: Mask ventilation without difficulty LMA: LMA with gastric port inserted LMA Size: 4.0 Number of attempts: 1 Placement Confirmation: positive ETCO2 and breath sounds checked- equal and bilateral Tube secured with: Tape Dental Injury: Teeth and Oropharynx as per pre-operative assessment

## 2020-05-19 NOTE — Transfer of Care (Signed)
Immediate Anesthesia Transfer of Care Note  Patient: [name]  Procedure(s) Performed: OPEN HERNIA REPAIR INCISIONAL (N/A Abdomen)  Patient Location: PACU  Anesthesia Type:General  Level of Consciousness: sedated, patient cooperative and responds to stimulation  Airway & Oxygen Therapy: Patient Spontanous Breathing and Patient connected to face mask oxygen  Post-op Assessment: Report given to RN, Post -op Vital signs reviewed and stable and Patient moving all extremities  Post vital signs: Reviewed and stable  Last Vitals:  Vitals Value Taken Time  BP 116/53 05/19/20 0940  Temp 36.3 C 05/19/20 0940  Pulse 73 05/19/20 0944  Resp 13 05/19/20 0944  SpO2 100 % 05/19/20 0944  Vitals shown include unvalidated device data.  Last Pain:  Vitals:   05/19/20 0706  TempSrc: Oral  PainSc: 0-No pain      Patients Stated Pain Goal: 6 (79/43/27 6147)  Complications: No complications documented.

## 2020-05-19 NOTE — Anesthesia Preprocedure Evaluation (Signed)
Anesthesia Evaluation  Patient identified by MRN, date of birth, ID band Patient awake    Reviewed: Allergy & Precautions, NPO status , Patient's Chart, lab work & pertinent test results  History of Anesthesia Complications Negative for: history of anesthetic complications  Airway Mallampati: II  TM Distance: >3 FB Neck ROM: Full    Dental  (+) Teeth Intact   Pulmonary neg pulmonary ROS,    Pulmonary exam normal        Cardiovascular hypertension, Normal cardiovascular exam     Neuro/Psych negative neurological ROS  negative psych ROS   GI/Hepatic negative GI ROS, Neg liver ROS,   Endo/Other  Morbid obesity  Renal/GU negative Renal ROS  negative genitourinary   Musculoskeletal  (+) Arthritis , Osteoarthritis,    Abdominal   Peds  Hematology negative hematology ROS (+)   Anesthesia Other Findings   Reproductive/Obstetrics                            Anesthesia Physical Anesthesia Plan  ASA: III  Anesthesia Plan: General   Post-op Pain Management:    Induction: Intravenous  PONV Risk Score and Plan: 3 and Ondansetron, Dexamethasone, Midazolam and Treatment may vary due to age or medical condition  Airway Management Planned: LMA  Additional Equipment: None  Intra-op Plan:   Post-operative Plan: Extubation in OR  Informed Consent: I have reviewed the patients History and Physical, chart, labs and discussed the procedure including the risks, benefits and alternatives for the proposed anesthesia with the patient or authorized representative who has indicated his/her understanding and acceptance.     Dental advisory given  Plan Discussed with:   Anesthesia Plan Comments:         Anesthesia Quick Evaluation

## 2020-05-21 ENCOUNTER — Encounter (HOSPITAL_BASED_OUTPATIENT_CLINIC_OR_DEPARTMENT_OTHER): Payer: Self-pay | Admitting: Surgery

## 2020-08-31 ENCOUNTER — Other Ambulatory Visit: Payer: Self-pay

## 2020-08-31 ENCOUNTER — Ambulatory Visit
Admission: EM | Admit: 2020-08-31 | Discharge: 2020-08-31 | Disposition: A | Discharge status: Home / Self Care | Payer: BC Managed Care – PPO | Attending: Emergency Medicine | Admitting: Emergency Medicine

## 2020-08-31 DIAGNOSIS — J069 Acute upper respiratory infection, unspecified: Secondary | ICD-10-CM

## 2020-08-31 MED ORDER — DM-GUAIFENESIN ER 30-600 MG PO TB12: 1.0000 | ORAL_TABLET | Freq: Two times a day (BID) | ORAL | 0 refills | Status: DC

## 2020-08-31 MED ORDER — LORATADINE 10 MG PO TABS: 10.0000 mg | ORAL_TABLET | Freq: Every day | ORAL | 0 refills | Status: DC

## 2020-08-31 MED ORDER — BENZONATATE 200 MG PO CAPS: 200.0000 mg | ORAL_CAPSULE | Freq: Three times a day (TID) | ORAL | 0 refills | Status: AC | PRN

## 2020-08-31 MED ORDER — FLUTICASONE PROPIONATE 50 MCG/ACT NA SUSP: 1.0000 | Freq: Every day | NASAL | 0 refills | Status: DC

## 2020-08-31 NOTE — ED Provider Notes (Signed)
[facility]    CSN: [id number] Arrival date & time: 08/31/20  1754      History   Chief Complaint Chief Complaint  Patient presents with  . Cough    HPI Neilah Fulwider is a 60 y.o. female history of hypertension presenting today for evaluation of cough and congestion.  Reports over the past 4 days she has had cough, congestion, postnasal drainage and associated hoarseness.  Denies sore throat.  Denies fevers chills or body aches.  Denies known COVID exposure.  HPI  Past Medical History:  Diagnosis Date  . Hypertension    followed by pcp  . Incisional hernia   . OA (osteoarthritis)     Patient Active Problem List   Diagnosis Date Noted  . Primary osteoarthritis of left knee 04/20/2014  . Obesity (BMI 30-39.9) 02/06/2014  . Anemia 08/08/2013  . Asthma 08/08/2013  . Bilateral knee pain 08/08/2013  . Right foot pain 08/08/2013  . Essential hypertension 06/23/2013  . S/P hysterectomy - [facility]inci  10/07/2012  . Menorrhagia with fibroids 10/07/2012    Past Surgical History:  Procedure Laterality Date  . BUNIONECTOMY Right 2020   great toe bunionectomy/  3rd toe pinning of fracture  . CARPAL TUNNEL RELEASE Right 1994  . HERNIA REPAIR  1996  . INCISIONAL HERNIA REPAIR N/A 05/19/2020   Procedure: OPEN HERNIA REPAIR INCISIONAL;  Surgeon: Coralie Keens, MD;  Location: [facility];  Service: General;  Laterality: N/A;  . LAPAROSCOPIC BILATERAL SALPINGECTOMY Bilateral 10/07/2012   Procedure: LAPAROSCOPIC BILATERAL SALPINGECTOMY;  Surgeon: Marvene Staff, MD;  Location: [facility];  Service: Gynecology;  Laterality: Bilateral;  . ROBOTIC ASSISTED LAPAROSCOPIC LYSIS OF ADHESION N/A 10/07/2012   Procedure: ROBOTIC ASSISTED LAPAROSCOPIC LYSIS OF ADHESION;  Surgeon: Marvene Staff, MD;  Location: [facility];  Service: Gynecology;  Laterality: N/A;  . ROBOTIC ASSISTED TOTAL HYSTERECTOMY N/A 10/07/2012   Procedure: ROBOTIC ASSISTED TOTAL HYSTERECTOMY;   Surgeon: Marvene Staff, MD;  Location: [facility];  Service: Gynecology;  Laterality: N/A;    OB History   No obstetric history on file.      Home Medications    Prior to Admission medications   Medication Sig Start Date End Date Taking? Authorizing Provider  benzonatate (TESSALON) 200 MG capsule Take 1 capsule (200 mg total) by mouth 3 (three) times daily as needed for up to 7 days for cough. 08/31/20 09/07/20 Yes Miyani Cronic C, PA-C  dextromethorphan-guaiFENesin (MUCINEX DM) 30-600 MG 12hr tablet Take 1 tablet by mouth 2 (two) times daily. 08/31/20  Yes Maleik Vanderzee C, PA-C  fluticasone (FLONASE) 50 MCG/ACT nasal spray Place 1-2 sprays into both nostrils daily. 08/31/20  Yes Amiyah Shryock C, PA-C  loratadine (CLARITIN) 10 MG tablet Take 1 tablet (10 mg total) by mouth daily. 08/31/20  Yes Oluwasemilore Pascuzzi C, PA-C  amLODipine (NORVASC) 10 MG tablet Take 10 mg by mouth daily.    [provider]  lisinopril (PRINIVIL,ZESTRIL) 40 MG tablet Take 40 mg by mouth daily. 01/02/18   [provider]    Family History History reviewed. No pertinent family history.  Social History Social History   Tobacco Use  . Smoking status: Never Smoker  . Smokeless tobacco: Never Used  Vaping Use  . Vaping Use: Never used  Substance Use Topics  . Alcohol use: No  . Drug use: Never     Allergies   Aspirin, Codeine, Cyclobenzaprine, Ibuprofen, Penicillins, and Latex   Review of Systems Review of Systems  Constitutional: Negative for activity change, appetite change, chills, fatigue and fever.  HENT: Positive for congestion, postnasal drip, rhinorrhea, sinus pressure and voice change. Negative for ear pain, sore throat and trouble swallowing.   Eyes: Negative for discharge and redness.  Respiratory: Positive for cough. Negative for chest tightness and shortness of breath.   Cardiovascular: Negative for chest pain.  Gastrointestinal: Negative for abdominal pain, diarrhea,  nausea and vomiting.  Musculoskeletal: Negative for myalgias.  Skin: Negative for rash.  Neurological: Negative for dizziness, light-headedness and headaches.     Physical Exam Triage Vital Signs ED Triage Vitals  Enc Vitals Group     BP      Pulse      Resp      Temp      Temp src      SpO2      Weight      Height      Head Circumference      Peak Flow      Pain Score      Pain Loc      Pain Edu?      Excl. in [place]?    No data found.  Updated Vital Signs BP (!) 155/85 (BP Location: Left Arm)   Pulse 97   Temp 98 F (36.7 C) (Oral)   Resp 18   LMP 10/01/2012   SpO2 97%   Visual Acuity Right Eye Distance:   Left Eye Distance:   Bilateral Distance:    Right Eye Near:   Left Eye Near:    Bilateral Near:     Physical Exam Vitals and nursing note reviewed.  Constitutional:      Appearance: She is well-developed.     Comments: No acute distress  HENT:     Head: Normocephalic and atraumatic.     Ears:     Comments: Bilateral ears without tenderness to palpation of external auricle, tragus and mastoid, EAC's without erythema or swelling, TM's with good bony landmarks and cone of light. Non erythematous.     Nose: Nose normal.     Mouth/Throat:     Comments: Oral mucosa pink and moist, no tonsillar enlargement or exudate. Posterior pharynx patent and nonerythematous, no uvula deviation or swelling. Normal phonation. Eyes:     Conjunctiva/sclera: Conjunctivae normal.  Cardiovascular:     Rate and Rhythm: Normal rate.  Pulmonary:     Effort: Pulmonary effort is normal. No respiratory distress.     Comments: Breathing comfortably at rest, CTABL, no wheezing, rales or other adventitious sounds auscultated Abdominal:     General: There is no distension.  Musculoskeletal:        General: Normal range of motion.     Cervical back: Neck supple.  Skin:    General: Skin is warm and dry.  Neurological:     Mental Status: She is alert and oriented to person, place,  and time.      UC Treatments / Results  Labs (all labs ordered are listed, but only abnormal results are displayed) Labs Reviewed  NOVEL CORONAVIRUS, NAA    EKG   Radiology No results found.  Procedures Procedures (including critical care time)  Medications Ordered in UC Medications - No data to display  Initial Impression / Assessment and Plan / UC Course  I have reviewed the triage vital signs and the nursing notes.  Pertinent labs & imaging results that were available during my care of the patient were reviewed by me and considered in  my medical decision making (see chart for details).     Viral URI with cough-vital signs stable, exam reassuring, lungs clear to auscultation, COVID test pending for screening, recommending continued symptomatic and supportive care rest and fluids.  Recommendations provided.  Day 4 of symptoms, encouraged continued monitoring for gradual resolution,Discussed strict return precautions. Patient verbalized understanding and is agreeable with plan.  Final Clinical Impressions(s) / UC Diagnoses   Final diagnoses:  Viral URI with cough     Discharge Instructions     COVID test pending, monitor MyChart for results Tessalon every 8 hours for cough Mucinex DM twice daily for further relief of congestion and cough Flonase nasal spray and daily Claritin for further relief of congestion and any throat irritation Rest and fluids Follow-up if not improving or worsening    ED Prescriptions    Medication Sig Dispense Auth. Provider   benzonatate (TESSALON) 200 MG capsule Take 1 capsule (200 mg total) by mouth 3 (three) times daily as needed for up to 7 days for cough. 28 capsule Luman Holway C, PA-C   dextromethorphan-guaiFENesin (MUCINEX DM) 30-600 MG 12hr tablet Take 1 tablet by mouth 2 (two) times daily. 16 tablet Bella Brummet C, PA-C   fluticasone (FLONASE) 50 MCG/ACT nasal spray Place 1-2 sprays into both nostrils daily. 16 g  Ervin Hensley C, PA-C   loratadine (CLARITIN) 10 MG tablet Take 1 tablet (10 mg total) by mouth daily. 10 tablet Miko Sirico, [place] C, PA-C     PDMP not reviewed this encounter.   Janith Lima, [place] 08/31/20 1906

## 2020-08-31 NOTE — ED Triage Notes (Signed)
Pt c/o productive cough with green sputum since Saturday. C/o loss of voice and congestion. States taken deslyn and Robitussin with no relief. States went out on Thursday with her hair wet.

## 2020-08-31 NOTE — Discharge Instructions (Addendum)
COVID test pending, monitor MyChart for results Tessalon every 8 hours for cough Mucinex DM twice daily for further relief of congestion and cough Flonase nasal spray and daily Claritin for further relief of congestion and any throat irritation Rest and fluids Follow-up if not improving or worsening

## 2020-09-02 LAB — NOVEL CORONAVIRUS, NAA: SARS-CoV-2, NAA: NOT DETECTED

## 2020-09-02 LAB — SARS-COV-2, NAA 2 DAY TAT

## 2020-09-23 DIAGNOSIS — E782 Mixed hyperlipidemia: Secondary | ICD-10-CM | POA: Insufficient documentation

## 2020-11-08 ENCOUNTER — Encounter: Payer: Self-pay | Admitting: Emergency Medicine

## 2020-11-08 ENCOUNTER — Other Ambulatory Visit: Payer: Self-pay

## 2020-11-08 ENCOUNTER — Ambulatory Visit
Admission: EM | Admit: 2020-11-08 | Discharge: 2020-11-08 | Disposition: A | Discharge status: Home / Self Care | Payer: BC Managed Care – PPO | Attending: Urgent Care | Admitting: Urgent Care

## 2020-11-08 DIAGNOSIS — L249 Irritant contact dermatitis, unspecified cause: Secondary | ICD-10-CM

## 2020-11-08 MED ORDER — TRIAMCINOLONE ACETONIDE 0.1 % EX CREA: 1.0000 "application " | TOPICAL_CREAM | Freq: Two times a day (BID) | CUTANEOUS | 0 refills | Status: DC

## 2020-11-08 NOTE — ED Provider Notes (Signed)
[place]   MRN: [id number] DOB: 1960-11-14  Subjective:   [name] is a 60 y.o. female presenting for 5-day history of acute onset rash over the middle of her forehead.  Patient had a few bumps to over the top left upper forehead but these have resolved.  Reports that symptoms started after she went to her father's home and cleaned it out as it was very dirty.  She used harsh cleaning products including Clorox and did not use any gloves or PPE.  Denies fever, drainage of pus or bleeding, tenderness, itching.  No current facility-administered medications for this encounter.  Current Outpatient Medications:    amLODipine (NORVASC) 10 MG tablet, Take 10 mg by mouth daily., Disp: , Rfl:    dextromethorphan-guaiFENesin (MUCINEX DM) 30-600 MG 12hr tablet, Take 1 tablet by mouth 2 (two) times daily., Disp: 16 tablet, Rfl: 0   fluticasone (FLONASE) 50 MCG/ACT nasal spray, Place 1-2 sprays into both nostrils daily., Disp: 16 g, Rfl: 0   lisinopril (PRINIVIL,ZESTRIL) 40 MG tablet, Take 40 mg by mouth daily., Disp: , Rfl: 2   loratadine (CLARITIN) 10 MG tablet, Take 1 tablet (10 mg total) by mouth daily., Disp: 10 tablet, Rfl: 0   Allergies  Allergen Reactions   Aspirin Other (See Comments)    Severe stomach pain   Codeine Nausea And Vomiting   Cyclobenzaprine Other (See Comments)    Gi upset   Ibuprofen Other (See Comments)    Severe stomach pain   Penicillins Hives   Latex Rash    Past Medical History:  Diagnosis Date   Hypertension    followed by pcp   Incisional hernia    OA (osteoarthritis)      Past Surgical History:  Procedure Laterality Date   BUNIONECTOMY Right 2020   great toe bunionectomy/  3rd toe pinning of fracture   CARPAL TUNNEL RELEASE Right [place] N/A 05/19/2020   Procedure: OPEN HERNIA REPAIR INCISIONAL;  Surgeon: Coralie Keens, MD;  Location: [facility];  Service: General;   Laterality: N/A;   LAPAROSCOPIC BILATERAL SALPINGECTOMY Bilateral 10/07/2012   Procedure: LAPAROSCOPIC BILATERAL SALPINGECTOMY;  Surgeon: Marvene Staff, MD;  Location: [facility];  Service: Gynecology;  Laterality: Bilateral;   ROBOTIC ASSISTED LAPAROSCOPIC LYSIS OF ADHESION N/A 10/07/2012   Procedure: ROBOTIC ASSISTED LAPAROSCOPIC LYSIS OF ADHESION;  Surgeon: Marvene Staff, MD;  Location: [facility];  Service: Gynecology;  Laterality: N/A;   ROBOTIC ASSISTED TOTAL HYSTERECTOMY N/A 10/07/2012   Procedure: ROBOTIC ASSISTED TOTAL HYSTERECTOMY;  Surgeon: Marvene Staff, MD;  Location: [facility];  Service: Gynecology;  Laterality: N/A;    History reviewed. No pertinent family history.  Social History   Tobacco Use   Smoking status: Never   Smokeless tobacco: Never  Vaping Use   Vaping Use: Never used  Substance Use Topics   Alcohol use: No   Drug use: Never    ROS   Objective:   Vitals: BP (!) 146/85 (BP Location: Left Arm)   Pulse 98   Temp 98.3 F (36.8 C) (Oral)   Resp 18   LMP 10/01/2012   SpO2 98%   Physical Exam Constitutional:      General: She is not in acute distress.    Appearance: Normal appearance. She is well-developed. She is not ill-appearing, toxic-appearing or diaphoretic.  HENT:     Head: Normocephalic and atraumatic.      Nose: Nose normal.  Mouth/Throat:     Mouth: Mucous membranes are moist.     Pharynx: Oropharynx is clear.  Eyes:     General: No scleral icterus.       Right eye: No discharge.        Left eye: No discharge.     Extraocular Movements: Extraocular movements intact.     Conjunctiva/sclera: Conjunctivae normal.     Pupils: Pupils are equal, round, and reactive to light.  Cardiovascular:     Rate and Rhythm: Normal rate.  Pulmonary:     Effort: Pulmonary effort is normal.  Skin:    General: Skin is warm and dry.     Findings: Rash present.  Neurological:     General: No focal deficit present.     Mental Status: She  is alert and oriented to person, place, and time.     Motor: No weakness.     Coordination: Coordination normal.     Gait: Gait normal.     Deep Tendon Reflexes: Reflexes normal.  Psychiatric:        Mood and Affect: Mood normal.        Behavior: Behavior normal.        Thought Content: Thought content normal.        Judgment: Judgment normal.    Assessment and Plan :   PDMP not reviewed this encounter.  1. Irritant contact dermatitis, unspecified trigger     Recommended triamcinolone cream for what I suspect is an irritant dermatitis secondary to the cleaning agents she was using and other possible irritants in her environment when she was cleaning at her father's house. Counseled patient on potential for adverse effects with medications prescribed/recommended today, ER and return-to-clinic precautions discussed, patient verbalized understanding.    Jaynee Eagles, PA-C 11/08/20 1150

## 2020-11-08 NOTE — ED Triage Notes (Signed)
Pt sts rash to middle of forehead between eyes x 5 days

## 2020-11-19 ENCOUNTER — Other Ambulatory Visit: Payer: Self-pay | Admitting: Surgery

## 2020-11-26 ENCOUNTER — Other Ambulatory Visit: Payer: Self-pay | Admitting: Surgery

## 2020-11-26 ENCOUNTER — Ambulatory Visit
Admission: RE | Admit: 2020-11-26 | Discharge: 2020-11-26 | Disposition: A | Discharge status: Home / Self Care | Payer: BC Managed Care – PPO | Source: Ambulatory Visit | Attending: Surgery | Admitting: Surgery

## 2020-12-07 ENCOUNTER — Other Ambulatory Visit: Payer: Self-pay

## 2020-12-07 ENCOUNTER — Encounter (HOSPITAL_BASED_OUTPATIENT_CLINIC_OR_DEPARTMENT_OTHER): Payer: Self-pay | Admitting: Physical Therapy

## 2020-12-07 ENCOUNTER — Ambulatory Visit (HOSPITAL_BASED_OUTPATIENT_CLINIC_OR_DEPARTMENT_OTHER): Payer: BC Managed Care – PPO | Attending: Surgery | Admitting: Physical Therapy

## 2020-12-07 DIAGNOSIS — M25562 Pain in left knee: Secondary | ICD-10-CM | POA: Insufficient documentation

## 2020-12-07 DIAGNOSIS — R262 Difficulty in walking, not elsewhere classified: Secondary | ICD-10-CM | POA: Insufficient documentation

## 2020-12-07 DIAGNOSIS — M6281 Muscle weakness (generalized): Secondary | ICD-10-CM | POA: Diagnosis not present

## 2020-12-07 DIAGNOSIS — G8929 Other chronic pain: Secondary | ICD-10-CM | POA: Diagnosis present

## 2020-12-07 NOTE — Therapy (Signed)
[place], [place], [address] Phone: [phone]   Fax:  [phone]  Physical Therapy Evaluation  Patient Details  Name: [name] MRN: [id number] Date of Birth: 02-Jul-1960 Referring Provider (PT): Clovis Riley, MD   Encounter Date: 12/07/2020   PT End of Session - 12/07/20 1039     Visit Number 1    Number of Visits 7    Date for PT Re-Evaluation 01/21/21    Authorization Type [id number]    PT Start Time 1015    PT Stop Time 1053    PT Time Calculation (min) 38 min    Activity Tolerance Patient tolerated treatment well    Behavior During Therapy [facility] for tasks assessed/performed             Past Medical History:  Diagnosis Date   Hypertension    followed by pcp   Incisional hernia    OA (osteoarthritis)     Past Surgical History:  Procedure Laterality Date   BUNIONECTOMY Right 2020   great toe bunionectomy/  3rd toe pinning of fracture   CARPAL TUNNEL RELEASE Right [place] N/A 05/19/2020   Procedure: [place];  Surgeon: Coralie Keens, MD;  Location: [facility];  Service: General;  Laterality: N/A;   LAPAROSCOPIC BILATERAL SALPINGECTOMY Bilateral 10/07/2012   Procedure: LAPAROSCOPIC BILATERAL SALPINGECTOMY;  Surgeon: Marvene Staff, MD;  Location: [facility];  Service: Gynecology;  Laterality: Bilateral;   ROBOTIC ASSISTED LAPAROSCOPIC LYSIS OF ADHESION N/A 10/07/2012   Procedure: ROBOTIC ASSISTED LAPAROSCOPIC LYSIS OF ADHESION;  Surgeon: Marvene Staff, MD;  Location: [facility];  Service: Gynecology;  Laterality: N/A;   ROBOTIC ASSISTED TOTAL HYSTERECTOMY N/A 10/07/2012   Procedure: ROBOTIC ASSISTED TOTAL HYSTERECTOMY;  Surgeon: Marvene Staff, MD;  Location: [facility];  Service: Gynecology;  Laterality: N/A;    There were no vitals filed for this visit.    Subjective Assessment -  12/07/20 1020     Subjective Both knees hurt but Lt is worse, partial replacement about 6 years ago. Gradual weight gain over time. [place] a couple of weeks ago, first friday in [place]- was wearing slides and the end caught the curb and fell on Rt side. I cannot lift my Rt arm since then. Denies regular exercise at this time but in 2 years I have not anyting.    Patient Stated Goals work on getting active. would like to do pool    Currently in Pain? Yes    Pain Score 8     Pain Location Knee    Pain Orientation Left   bil but L>R   Pain Descriptors / Indicators Sore    Aggravating Factors  weight gain, walking    Pain Relieving Factors rest                [facility] PT Assessment - 12/07/20 0001       Assessment   Medical Diagnosis morbid obesity    Referring Provider (PT) Clovis Riley, MD    Onset Date/Surgical Date --   chronic with recent fall 4 weeks ago   Hand Dominance Right      Precautions   Precautions None      Restrictions   Weight Bearing Restrictions No      Balance Screen   Has the patient fallen in the past 6 months Yes    How many  times? 1    Has the patient had a decrease in activity level because of a fear of falling?  Yes    Is the patient reluctant to leave their home because of a fear of falling?  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      Cognition   Overall Cognitive Status Within Functional Limits for tasks assessed      Sensation   Additional Comments [facility]      Posture/Postural Control   Posture Comments flexed posture in standing      ROM / Strength   AROM / PROM / Strength Strength;AROM      AROM   Overall AROM Comments neither shoulder can reach above 90 deg since fall      Strength   Overall Strength Comments able to hol Lt UE against  gravity when placed OH by PT, Rt had too much pain to hold    Strength Assessment Site Knee    Right/Left Knee Right;Left    Right Knee Flexion 5/5    Right Knee Extension 5/5    Left Knee Flexion 5/5    Left Knee Extension 5/5      Special Tests   Other special tests 5TSTS 17s      Ambulation/Gait   Gait Comments Rt knee genu valgus resulting in pes planus and Rt trunk SB in Rt stance phase                        Objective measurements completed on examination: See above findings.               PT Education - 12/07/20 1323     Education Details anatomy of condition, POC, HEP, exercise rationale, walking program, aquatics    Person(s) Educated Patient    Methods Explanation;Handout    Comprehension Verbalized understanding;Need further instruction              PT Short Term Goals - 12/07/20 1334       PT SHORT TERM GOAL #1   Title pt will be consistently doing her walking program    Baseline began discussing today    Time 2    Period Weeks    Status New    Target Date 12/24/20               PT Long Term Goals - 12/07/20 1331       PT LONG TERM GOAL #1   Title pt will verbalize understanding of walking program moving forward    Baseline will educate and establish proper progression    Time 6    Period Weeks    Status New    Target Date 01/21/21      PT LONG TERM GOAL #2   Title 5TSTS to 14s or less    Baseline 17s at eval    Time 6    Period Weeks    Status New    Target Date 01/21/21      PT LONG TERM GOAL #3   Title pt will be comfortable with aquatic exercises and able to perform independently    Baseline requests aquatic HEP    Time 6    Period Weeks  Status New    Target Date 01/21/21                    Plan - 12/07/20 1048     Clinical Impression Statement Pt presents to PT with complaints of bil knee pain due to weight and weight gain. She did have a fall the beg of this month that resulted in  inability to raise her arms overhead actively but she says she is fine working below shoulder level and they are not even on her mind. With all of the other appointments she has to do, she does not feel that an ortho visit for this is appropriate right now. She denies regular exercise at this time and verbalize readiness to get moving more. Provied with some basic exercises for gross strengthening and asked her to begin with walking 10 min BID and progressing to 15 next week. Limited availability for scheduling due to work schedule and will trial 1/week for cost reasons. She would like to do aquatic exercise but denies access to a pool. We agreed that we would alternate weeks in pool/on land and she will look for pool access that works for her.    Personal Factors and Comorbidities Fitness;Finances;Comorbidity 3+    Comorbidities OA, h/o foot & knee surgery, recent fall    Examination-Activity Limitations Reach Overhead;Sit;Bend;Sleep;Carry;Stairs;Stand;Lift;Locomotion Level    Examination-Participation Restrictions Occupation;Community Activity;Driving    Stability/Clinical Decision Making Unstable/Unpredictable    Clinical Decision Making High    Rehab Potential Fair    PT Frequency 1x / week    PT Duration 6 weeks    PT Treatment/Interventions ADLs/Self Care Home Management;Aquatic Therapy;Cryotherapy;Electrical Stimulation;Gait training;Moist Heat;Iontophoresis '4mg'$ /ml Dexamethasone;Stair training;Functional mobility training;Therapeutic activities;Therapeutic exercise;Neuromuscular re-education;Manual techniques;Patient/family education;Passive range of motion;Dry needling;Taping    PT Next Visit Plan on land- review [facility] hip strength & progress, Aquatics: gross mobility and cardio challenges as tolerated    PT Home Exercise Plan [id number]     Walking: this week 10 min BID, next week 15 min TID    Consulted and Agree with Plan of Care Patient             Patient will benefit from skilled  therapeutic intervention in order to improve the following deficits and impairments:  Abnormal gait, Difficulty walking, Decreased activity tolerance, Pain, Improper body mechanics, Decreased strength, Postural dysfunction  Visit Diagnosis: Difficulty in walking, not elsewhere classified - Plan: PT plan of care cert/re-cert  Muscle weakness (generalized) - Plan: PT plan of care cert/re-cert  Chronic pain of left knee - Plan: PT plan of care cert/re-cert     Problem List Patient Active Problem List   Diagnosis Date Noted   Primary osteoarthritis of left knee 04/20/2014   Obesity (BMI 30-39.9) 02/06/2014   Anemia 08/08/2013   Asthma 08/08/2013   Bilateral knee pain 08/08/2013   Right foot pain 08/08/2013   Essential hypertension 06/23/2013   S/P hysterectomy - [facility]  10/07/2012   Menorrhagia with fibroids 10/07/2012   Cecile Gillispie C. Markhi Kleckner PT, DPT 12/07/20 1:38 PM   [facility] [address] [place], [place], [address] Phone: [phone]   Fax:  [phone]  Name: [name] MRN: [id number] Date of Birth: 03/20/1961

## 2020-12-21 ENCOUNTER — Encounter (HOSPITAL_BASED_OUTPATIENT_CLINIC_OR_DEPARTMENT_OTHER): Payer: Self-pay | Admitting: Physical Therapy

## 2020-12-21 ENCOUNTER — Ambulatory Visit (HOSPITAL_BASED_OUTPATIENT_CLINIC_OR_DEPARTMENT_OTHER): Payer: BC Managed Care – PPO | Attending: Surgery | Admitting: Physical Therapy

## 2020-12-21 ENCOUNTER — Other Ambulatory Visit: Payer: Self-pay

## 2020-12-21 DIAGNOSIS — M25562 Pain in left knee: Secondary | ICD-10-CM | POA: Insufficient documentation

## 2020-12-21 DIAGNOSIS — G8929 Other chronic pain: Secondary | ICD-10-CM | POA: Insufficient documentation

## 2020-12-21 DIAGNOSIS — M6281 Muscle weakness (generalized): Secondary | ICD-10-CM | POA: Diagnosis present

## 2020-12-21 DIAGNOSIS — R262 Difficulty in walking, not elsewhere classified: Secondary | ICD-10-CM | POA: Diagnosis not present

## 2020-12-21 NOTE — Therapy (Signed)
[place], [place], [address] Phone: [phone]   Fax:  [phone]  Physical Therapy Treatment  Patient Details  Name: [name] MRN: [id number] Date of Birth: [birth year] Referring Provider (PT): Clovis Riley, MD   Encounter Date: 12/21/2020   PT End of Session - 12/21/20 1111     Visit Number 2    Number of Visits 7    Date for PT Re-Evaluation 01/21/21    Authorization Type BCBS [id number]    PT Start Time 1110   had to finish phone call   PT Stop Time 1150    PT Time Calculation (min) 40 min    Activity Tolerance Patient tolerated treatment well    Behavior During Therapy [facility] for tasks assessed/performed             Past Medical History:  Diagnosis Date   Hypertension    followed by pcp   Incisional hernia    OA (osteoarthritis)     Past Surgical History:  Procedure Laterality Date   BUNIONECTOMY Right 2020   great toe bunionectomy/  3rd toe pinning of fracture   CARPAL TUNNEL RELEASE Right [place] N/A 05/19/2020   Procedure: [place];  Surgeon: Coralie Keens, MD;  Location: [facility];  Service: General;  Laterality: N/A;   LAPAROSCOPIC BILATERAL SALPINGECTOMY Bilateral 10/07/2012   Procedure: LAPAROSCOPIC BILATERAL SALPINGECTOMY;  Surgeon: Marvene Staff, MD;  Location: [facility];  Service: Gynecology;  Laterality: Bilateral;   ROBOTIC ASSISTED LAPAROSCOPIC LYSIS OF ADHESION N/A 10/07/2012   Procedure: ROBOTIC ASSISTED LAPAROSCOPIC LYSIS OF ADHESION;  Surgeon: Marvene Staff, MD;  Location: [facility];  Service: Gynecology;  Laterality: N/A;   ROBOTIC ASSISTED TOTAL HYSTERECTOMY N/A 10/07/2012   Procedure: ROBOTIC ASSISTED TOTAL HYSTERECTOMY;  Surgeon: Marvene Staff, MD;  Location: [facility];  Service: Gynecology;  Laterality: N/A;    There were no vitals filed for this visit.    Subjective Assessment - 12/21/20 1114     Subjective Just that one shoulder hurts. Difficulty scheduling appointments and is trying to see ortho for shoulders.    Patient Stated Goals work on getting active. would like to do pool    Currently in Pain? No/denies    Pain Score 0-No pain    Pain Location Knee                               [facility] Adult PT Treatment/Exercise - 12/21/20 0001       Exercises   Exercises Knee/Hip      Knee/Hip Exercises: Machines for Strengthening   Cybex Knee Extension 25lb    Cybex Knee Flexion 35lb    Other Machine nu step 5 min L5 LE only      Knee/Hip Exercises: Standing   Heel Raises Limitations at counter for support    Functional Squat Limitations sink pull off squats    Other Standing Knee Exercises sit<>stand with cues for gluts    Other Standing Knee Exercises standing HS curls      Knee/Hip Exercises: Seated   Other Seated Knee/Hip Exercises seated trunk rotation with march                       PT Short Term Goals - 12/21/20 1159       PT SHORT  TERM GOAL #1   Title pt will be consistently doing her walking program    Status Achieved               PT Long Term Goals - 12/07/20 1331       PT LONG TERM GOAL #1   Title pt will verbalize understanding of walking program moving forward    Baseline will educate and establish proper progression    Time 6    Period Weeks    Status New    Target Date 01/21/21      PT LONG TERM GOAL #2   Title 5TSTS to 14s or less    Baseline 17s at eval    Time 6    Period Weeks    Status New    Target Date 01/21/21      PT LONG TERM GOAL #3   Title pt will be comfortable with aquatic exercises and able to perform independently    Baseline requests aquatic HEP    Time 6    Period Weeks    Status New    Target Date 01/21/21                   Plan - 12/21/20 1158     Clinical Impression Statement Used gym machines for strengthening and then  created HEP with motions to activate the same muscle groups. Pt really feel that walking is helping her and she is excited to commit to the necessary lifestyle changes to make her surgery successful. poor core control is noted and we will continue to address this.    PT Treatment/Interventions ADLs/Self Care Home Management;Aquatic Therapy;Cryotherapy;Electrical Stimulation;Gait training;Moist Heat;Iontophoresis '4mg'$ /ml Dexamethasone;Stair training;Functional mobility training;Therapeutic activities;Therapeutic exercise;Neuromuscular re-education;Manual techniques;Patient/family education;Passive range of motion;Dry needling;Taping    PT Next Visit Plan core strengthening    PT Home Exercise Plan [id number]     Walking: this week 10 min BID, next week 15 min TID    Consulted and Agree with Plan of Care Patient             Patient will benefit from skilled therapeutic intervention in order to improve the following deficits and impairments:  Abnormal gait, Difficulty walking, Decreased activity tolerance, Pain, Improper body mechanics, Decreased strength, Postural dysfunction  Visit Diagnosis: Difficulty in walking, not elsewhere classified  Muscle weakness (generalized)  Chronic pain of left knee     Problem List Patient Active Problem List   Diagnosis Date Noted   Primary osteoarthritis of left knee 04/20/2014   Obesity (BMI 30-39.9) 02/06/2014   Anemia 08/08/2013   Asthma 08/08/2013   Bilateral knee pain 08/08/2013   Right foot pain 08/08/2013   Essential hypertension 06/23/2013   S/P hysterectomy - [facility]  10/07/2012   Menorrhagia with fibroids 10/07/2012    Angelik Walls C. Cinzia Devos PT, DPT 12/21/20 12:04 PM  [facility] [address] [place], [place], [address] Phone: [phone]   Fax:  [phone]  Name: Orion Overbaugh MRN: [id number] Date of Birth: 12-02-1960

## 2020-12-23 ENCOUNTER — Ambulatory Visit (HOSPITAL_BASED_OUTPATIENT_CLINIC_OR_DEPARTMENT_OTHER): Payer: BC Managed Care – PPO | Admitting: Physical Therapy

## 2020-12-23 ENCOUNTER — Other Ambulatory Visit: Payer: Self-pay

## 2020-12-23 ENCOUNTER — Encounter (HOSPITAL_BASED_OUTPATIENT_CLINIC_OR_DEPARTMENT_OTHER): Payer: Self-pay | Admitting: Physical Therapy

## 2020-12-23 DIAGNOSIS — R262 Difficulty in walking, not elsewhere classified: Secondary | ICD-10-CM

## 2020-12-23 DIAGNOSIS — M6281 Muscle weakness (generalized): Secondary | ICD-10-CM

## 2020-12-23 DIAGNOSIS — G8929 Other chronic pain: Secondary | ICD-10-CM

## 2020-12-23 NOTE — Therapy (Addendum)
[place], [place], [address] Phone: [phone]   Fax:  [phone]  Physical Therapy Treatment  Patient Details  Name: [name] MRN: [id number] Date of Birth: 28-Nov-1960 Referring Provider (PT): Clovis Riley, MD   Encounter Date: 12/23/2020   PT End of Session - 12/23/20 1112     Visit Number 3    Number of Visits 7    Date for PT Re-Evaluation 01/21/21    Authorization Type BCBS 0VL    PT Start Time 1110    PT Stop Time 1159    PT Time Calculation (min) 49 min    Activity Tolerance Patient tolerated treatment well    Behavior During Therapy [facility] for tasks assessed/performed             Past Medical History:  Diagnosis Date   Hypertension    followed by pcp   Incisional hernia    OA (osteoarthritis)     Past Surgical History:  Procedure Laterality Date   BUNIONECTOMY Right 2020   great toe bunionectomy/  3rd toe pinning of fracture   CARPAL TUNNEL RELEASE Right [place] N/A 05/19/2020   Procedure: [place];  Surgeon: Coralie Keens, MD;  Location: [facility];  Service: General;  Laterality: N/A;   LAPAROSCOPIC BILATERAL SALPINGECTOMY Bilateral 10/07/2012   Procedure: LAPAROSCOPIC BILATERAL SALPINGECTOMY;  Surgeon: Marvene Staff, MD;  Location: [facility];  Service: Gynecology;  Laterality: Bilateral;   ROBOTIC ASSISTED LAPAROSCOPIC LYSIS OF ADHESION N/A 10/07/2012   Procedure: ROBOTIC ASSISTED LAPAROSCOPIC LYSIS OF ADHESION;  Surgeon: Marvene Staff, MD;  Location: [facility];  Service: Gynecology;  Laterality: N/A;   ROBOTIC ASSISTED TOTAL HYSTERECTOMY N/A 10/07/2012   Procedure: ROBOTIC ASSISTED TOTAL HYSTERECTOMY;  Surgeon: Marvene Staff, MD;  Location: [facility];  Service: Gynecology;  Laterality: N/A;    There were no vitals filed for this visit.   Subjective Assessment - 12/23/20  1109     Subjective "i'm not as comfortable in the water as I thought I would be. My knee doesn't hurt in here at all"    Currently in Pain? Yes    Pain Score 7     Pain Orientation Left    Pain Descriptors / Indicators Sore                                        PT Education - 12/23/20 1136     Education Details Aquatic principles/benefits.    Person(s) Educated Patient    Methods Explanation    Comprehension Verbalized understanding              PT Short Term Goals - 12/21/20 1159       PT SHORT TERM GOAL #1   Title pt will be consistently doing her walking program    Status Achieved               PT Long Term Goals - 12/07/20 1331       PT LONG TERM GOAL #1   Title pt will verbalize understanding of walking program moving forward    Baseline will educate and establish proper progression    Time 6    Period Weeks    Status New    Target Date 01/21/21  PT LONG TERM GOAL #2   Title 5TSTS to 14s or less    Baseline 17s at eval    Time 6    Period Weeks    Status New    Target Date 01/21/21      PT LONG TERM GOAL #3   Title pt will be comfortable with aquatic exercises and able to perform independently    Baseline requests aquatic HEP    Time 6    Period Weeks    Status New    Target Date 01/21/21            Pt seen for aquatic therapy today.  Treatment took place in water 3.25-4.8 ft in depth at the [facility] pool. Temp of water was 94.  Pt entered/exited the pool via stairs step to pattern independently with bilat rail.  Intoduction to aquatic setting   Warm up: forward, backward and side stepping/walking cues for increased step length, increased speed, hand placement to increase resistance. Initially supported by blue noodle progressing to yellow then without. Multiple widths and lengths as pt uneasy upon entering water. She adapted well after movement throughout different depths maneuvering in  all directions.  Pt EDU on properties of water. Gait training to increase knee and hip flexion particularly rle (TKR 2016), heel strike.  Seated  Stretches: -gastroc and hamstrings 3 x 25 second hold. Knee flex/ext 2 x 10 reps cues for increased speed for resistance Sit to stand x 10.  Cues for knee flex.  Standing  Hip hinges x 10 Sit to stand x 10 cues for improved technique and positioning Holding to wall: add/abd 2x10 -marching 2 x 10 - hip flex x10 -hip ext x 10   Kick board push down 2 x 10. Vc ,tc and demo for core engagement/activation throughout exercises.   Pt requires buoyancy for support and to offload joints with strengthening exercises. Viscosity of the water is needed for resistance of strengthening; water current perturbations provides challenge to standing balance unsupported, requiring increased core activation.       Plan - 12/23/20 1348     Clinical Impression Statement Pt introduced to aquatic setting.  She manuevers in all depths up to 4.73f.  Pt with increased comfort in setting as session progresses. Pt instructed through varius LE and core exercises.  VC and TC as well as demonstration given for proper execution.  Pt with weak core, having difficulty maintaining erect posture with exercises as well as extending and maintaining feet on bottom of pool with sitting on water bench.  She is a good candidate for aquatic therapy.  Will exercises more effectively in environemtn as pt limited on land due to chronic knee pain.Pt reports compliance with walking program advancing it herself to 4 x weekly.    PT Treatment/Interventions ADLs/Self Care Home Management;Aquatic Therapy;Cryotherapy;Electrical Stimulation;Gait training;Moist Heat;Iontophoresis '4mg'$ /ml Dexamethasone;Stair training;Functional mobility training;Therapeutic activities;Therapeutic exercise;Neuromuscular re-education;Manual techniques;Patient/family education;Passive range of motion;Dry needling;Taping     PT Next Visit Plan core strengthening    PT Home Exercise Plan [id number]     Walking: this week 10 min BID, next week 15 min TID             Patient will benefit from skilled therapeutic intervention in order to improve the following deficits and impairments:  Abnormal gait, Difficulty walking, Decreased activity tolerance, Pain, Improper body mechanics, Decreased strength, Postural dysfunction  Visit Diagnosis: Difficulty in walking, not elsewhere classified  Muscle weakness (generalized)  Chronic pain of left knee  Problem List Patient Active Problem List   Diagnosis Date Noted   Primary osteoarthritis of left knee 04/20/2014   Obesity (BMI 30-39.9) 02/06/2014   Anemia 08/08/2013   Asthma 08/08/2013   Bilateral knee pain 08/08/2013   Right foot pain 08/08/2013   Essential hypertension 06/23/2013   S/P hysterectomy - [facility]  10/07/2012   Menorrhagia with fibroids 10/07/2012    Annamarie Major) Aaleigha Bozza MPT  12/23/2020, 2:01 PM  [facility] [address] [place], [place], [address] Phone: [phone]   Fax:  [phone]  Name: [name] MRN: [id number] Date of Birth: February 24, 1962Addended [facility] Kidney Tharon Aquas) Arlenis Blaydes MPT  12/29/2020

## 2020-12-29 ENCOUNTER — Other Ambulatory Visit: Payer: Self-pay

## 2020-12-29 ENCOUNTER — Encounter (HOSPITAL_BASED_OUTPATIENT_CLINIC_OR_DEPARTMENT_OTHER): Payer: Self-pay | Admitting: Physical Therapy

## 2020-12-29 ENCOUNTER — Ambulatory Visit (HOSPITAL_BASED_OUTPATIENT_CLINIC_OR_DEPARTMENT_OTHER): Payer: BC Managed Care – PPO | Admitting: Physical Therapy

## 2020-12-29 DIAGNOSIS — R262 Difficulty in walking, not elsewhere classified: Secondary | ICD-10-CM | POA: Diagnosis not present

## 2020-12-29 DIAGNOSIS — G8929 Other chronic pain: Secondary | ICD-10-CM

## 2020-12-29 DIAGNOSIS — M6281 Muscle weakness (generalized): Secondary | ICD-10-CM

## 2020-12-29 NOTE — Therapy (Signed)
[place], [place], [address] Phone: [phone]   Fax:  [phone]  Physical Therapy Treatment  Patient Details  Name: [name] MRN: [id number] Date of Birth: 11-07-1960 Referring Provider (PT): Clovis Riley, MD   Encounter Date: 12/29/2020   PT End of Session - 12/29/20 1316     Visit Number 4    Number of Visits 7    Date for PT Re-Evaluation 01/21/21    PT Start Time 8250    PT Stop Time 1115    PT Time Calculation (min) 40 min    Equipment Utilized During Treatment Other (comment)   Ankle cuff and waist buoys, kick board, noodle/sqoodle, nekdoodle, weights and barbells   Activity Tolerance Patient tolerated treatment well    Behavior During Therapy [facility] for tasks assessed/performed             Past Medical History:  Diagnosis Date   Hypertension    followed by pcp   Incisional hernia    OA (osteoarthritis)     Past Surgical History:  Procedure Laterality Date   BUNIONECTOMY Right 2020   great toe bunionectomy/  3rd toe pinning of fracture   CARPAL TUNNEL RELEASE Right [place] N/A 05/19/2020   Procedure: [place];  Surgeon: Coralie Keens, MD;  Location: [facility];  Service: General;  Laterality: N/A;   LAPAROSCOPIC BILATERAL SALPINGECTOMY Bilateral 10/07/2012   Procedure: LAPAROSCOPIC BILATERAL SALPINGECTOMY;  Surgeon: Marvene Staff, MD;  Location: [facility];  Service: Gynecology;  Laterality: Bilateral;   ROBOTIC ASSISTED LAPAROSCOPIC LYSIS OF ADHESION N/A 10/07/2012   Procedure: ROBOTIC ASSISTED LAPAROSCOPIC LYSIS OF ADHESION;  Surgeon: Marvene Staff, MD;  Location: [facility];  Service: Gynecology;  Laterality: N/A;   ROBOTIC ASSISTED TOTAL HYSTERECTOMY N/A 10/07/2012   Procedure: ROBOTIC ASSISTED TOTAL HYSTERECTOMY;  Surgeon: Marvene Staff, MD;  Location: [facility];  Service:  Gynecology;  Laterality: N/A;    There were no vitals filed for this visit.   Subjective Assessment - 12/29/20 1044     Subjective "This water makes my knee feel so much better.  I have been walking 30 mins 4 days a week and that has made me feel better as well"    Pain Score 4     Pain Location Knee    Pain Orientation Left    Pain Descriptors / Indicators Sore                                        PT Education - 12/29/20 1048     Education Details arthritis etiology, symptoms and treatment    Person(s) Educated Patient    Methods Explanation    Comprehension Verbalized understanding              PT Short Term Goals - 12/21/20 1159       PT SHORT TERM GOAL #1   Title pt will be consistently doing her walking program    Status Achieved               PT Long Term Goals - 12/07/20 1331       PT LONG TERM GOAL #1   Title pt will verbalize understanding of walking program moving forward    Baseline will educate and establish proper progression  Time 6    Period Weeks    Status New    Target Date 01/21/21      PT LONG TERM GOAL #2   Title 5TSTS to 14s or less    Baseline 17s at eval    Time 6    Period Weeks    Status New    Target Date 01/21/21      PT LONG TERM GOAL #3   Title pt will be comfortable with aquatic exercises and able to perform independently    Baseline requests aquatic HEP    Time 6    Period Weeks    Status New    Target Date 01/21/21            Pt seen for aquatic therapy today.  Treatment took place in water 3.25-4.8 ft in depth at the [facility] pool. Temp of water was 94.  Pt entered/exited the pool via stairs step to pattern independently with bilat rail.   Intoduction to aquatic setting     Warm up: forward, backward and side stepping/walking cues for increased step length, increased speed, hand placement to increase resistance. Initially supported by blue noodle progressing  to yellow then without. Multiple widths and lengths as pt uneasy upon entering water. She adapted well after movement throughout different depths maneuvering in all directions.  Pt EDU on properties of water. Gait training to increase knee and hip flexion particularly rle (TKR 2016), heel strike.   Seated  Stretches: -gastroc and hamstrings 3 x 25 second hold. Knee flex/ext 2 x 10 reps cues for increased speed for resistance Sit to stand x 10.  Cues for knee flex.   Standing  Step ups; forward and backward leading with R/L x 10; side stepping towards left x 10  Sit to stand x 10 cues from 3rd water step, knees flex @ 95-100 deg.  Pt given vc and demonstration for proper tech. Improved technique from lower surfaces.  VC for always pushing up from sitting rather than pulling Holding to wall: add/abd x10 -marching 2 x 10 - hip flex x10 -hip ext x 10   -knee flex 2 x10 Kick board push down 2 x 10. Vc ,tc and demo for core engagement/activation throughout exercises.        Pt requires buoyancy for support and to offload joints with strengthening exercises. Viscosity of the water is needed for resistance of strengthening; water current perturbations provides challenge to standing balance unsupported, requiring increased core activation.        Plan - 12/29/20 1045     Clinical Impression Statement Pt with full ROM left knee today submerged without discomfort.  Note: pt rising form chair using poor technique.  With vc, demonstration and multiple tries, she is able to improve completing without pain submerged on 3 water step. With compliance of HEP pt has overall improvement in pain level and function. She is instructed to increased amb time towards 40 mins as tolerated.    PT Treatment/Interventions ADLs/Self Care Home Management;Aquatic Therapy;Cryotherapy;Electrical Stimulation;Gait training;Moist Heat;Iontophoresis 4mg /ml Dexamethasone;Stair training;Functional mobility  training;Therapeutic activities;Therapeutic exercise;Neuromuscular re-education;Manual techniques;Patient/family education;Passive range of motion;Dry needling;Taping    PT Next Visit Plan core strengthening    PT Home Exercise Plan [id number]             Patient will benefit from skilled therapeutic intervention in order to improve the following deficits and impairments:  Abnormal gait, Difficulty walking, Decreased activity tolerance, Pain, Improper body mechanics, Decreased strength, Postural dysfunction  Visit Diagnosis: Difficulty in walking, not elsewhere classified  Muscle weakness (generalized)  Chronic pain of left knee     Problem List Patient Active Problem List   Diagnosis Date Noted   Primary osteoarthritis of left knee 04/20/2014   Obesity (BMI 30-39.9) 02/06/2014   Anemia 08/08/2013   Asthma 08/08/2013   Bilateral knee pain 08/08/2013   Right foot pain 08/08/2013   Essential hypertension 06/23/2013   S/P hysterectomy - [facility]  10/07/2012   Menorrhagia with fibroids 10/07/2012    Vedia Pereyra, PT 12/29/2020, 1:27 PM  [facility] [address] [place], [place], [address] Phone: [phone]   Fax:  [phone]  Name: [name] MRN: [id number] Date of Birth: Aug 21, 1960

## 2021-01-05 ENCOUNTER — Ambulatory Visit (HOSPITAL_BASED_OUTPATIENT_CLINIC_OR_DEPARTMENT_OTHER): Payer: BC Managed Care – PPO | Admitting: Physical Therapy

## 2021-01-05 ENCOUNTER — Other Ambulatory Visit: Payer: Self-pay

## 2021-01-05 ENCOUNTER — Encounter (HOSPITAL_BASED_OUTPATIENT_CLINIC_OR_DEPARTMENT_OTHER): Payer: Self-pay | Admitting: Physical Therapy

## 2021-01-05 DIAGNOSIS — R262 Difficulty in walking, not elsewhere classified: Secondary | ICD-10-CM | POA: Diagnosis not present

## 2021-01-05 DIAGNOSIS — M6281 Muscle weakness (generalized): Secondary | ICD-10-CM

## 2021-01-05 DIAGNOSIS — G8929 Other chronic pain: Secondary | ICD-10-CM

## 2021-01-05 DIAGNOSIS — M25562 Pain in left knee: Secondary | ICD-10-CM

## 2021-01-05 NOTE — Patient Instructions (Signed)
Use ice pack when needed for reduction in edema, stiffness or pain x 20-25 min .

## 2021-01-05 NOTE — Therapy (Signed)
[place], [place], [address] Phone: [phone]   Fax:  [phone]  Physical Therapy Treatment  Patient Details  Name: [name] MRN: [id number] Date of Birth: 1960/11/30 Referring Provider (PT): Clovis Riley, MD   Encounter Date: 01/05/2021   PT End of Session - 01/05/21 1035     Visit Number 5    Number of Visits 7    Date for PT Re-Evaluation 01/21/21    PT Start Time 1033    PT Stop Time 1115    PT Time Calculation (min) 42 min    Equipment Utilized During Treatment Other (comment)   Ankle cuff and waist buoys, kick board, noodle/sqoodle, nekdoodle, weights and barbells   Activity Tolerance Patient tolerated treatment well    Behavior During Therapy WFL for tasks assessed/performed             Past Medical History:  Diagnosis Date   Hypertension    followed by pcp   Incisional hernia    OA (osteoarthritis)     Past Surgical History:  Procedure Laterality Date   BUNIONECTOMY Right 2020   great toe bunionectomy/  3rd toe pinning of fracture   CARPAL TUNNEL RELEASE Right [place] N/A 05/19/2020   Procedure: [place];  Surgeon: Coralie Keens, MD;  Location: [facility];  Service: General;  Laterality: N/A;   LAPAROSCOPIC BILATERAL SALPINGECTOMY Bilateral 10/07/2012   Procedure: LAPAROSCOPIC BILATERAL SALPINGECTOMY;  Surgeon: Marvene Staff, MD;  Location: [facility];  Service: Gynecology;  Laterality: Bilateral;   ROBOTIC ASSISTED LAPAROSCOPIC LYSIS OF ADHESION N/A 10/07/2012   Procedure: ROBOTIC ASSISTED LAPAROSCOPIC LYSIS OF ADHESION;  Surgeon: Marvene Staff, MD;  Location: [facility];  Service: Gynecology;  Laterality: N/A;   ROBOTIC ASSISTED TOTAL HYSTERECTOMY N/A 10/07/2012   Procedure: ROBOTIC ASSISTED TOTAL HYSTERECTOMY;  Surgeon: Marvene Staff, MD;  Location: [facility];  Service:  Gynecology;  Laterality: N/A;    There were no vitals filed for this visit.   Subjective Assessment - 01/05/21 1042     Subjective My knee is just stiff, no pain.  It is better    Pain Score 0-No pain    Pain Orientation Left    Pain Descriptors / Indicators --   pt reporting only stiffness in prepatellar area.                                         PT Short Term Goals - 12/21/20 1159       PT SHORT TERM GOAL #1   Title pt will be consistently doing her walking program    Status Achieved               PT Long Term Goals - 12/07/20 1331       PT LONG TERM GOAL #1   Title pt will verbalize understanding of walking program moving forward    Baseline will educate and establish proper progression    Time 6    Period Weeks    Status New    Target Date 01/21/21      PT LONG TERM GOAL #2   Title 5TSTS to 14s or less    Baseline 17s at eval    Time 6    Period Weeks    Status New  Target Date 01/21/21      PT LONG TERM GOAL #3   Title pt will be comfortable with aquatic exercises and able to perform independently    Baseline requests aquatic HEP    Time 6    Period Weeks    Status New    Target Date 01/21/21           Pt seen for aquatic therapy today.  Treatment took place in water 3.25-4.8 ft in depth at the [facility] pool. Temp of water was 94.  Pt entered/exited the pool via stairs step to pattern independently with bilat rail.   Intoduction to aquatic setting     Warm up: forward, backward and side stepping/walking cues for increased step length, increased speed, hand placement to increase resistance. Initially supported by blue noodle progressing to yellow then without. Multiple widths and lengths as pt uneasy upon entering water. She adapted well after movement throughout different depths maneuvering in all directions.  Pt EDU on properties of water.   Seated  Stretches: -gastroc and hamstrings 3 x 25  second hold. -Knee flex x 10 resisted by water band -knee ext x 10 "    Standing  Step ups; forward and backward leading with R/L x 10; side stepping towards left x 10 VC and demo for proper weight shift -squats on bottom step x 10.  VC for weightbearing on heels for proper execution Pt given vc and demonstration for proper tech. Improved technique from lower surfaces.  VC for always pushing up from sitting rather than pulling UE unsupported;add/abd x10 -marching x 10 - hip flex x10 -hip ext x 10   -knee flex  Vc ,tc and demo for core engagement/activation throughout exercises.         Pt requires buoyancy for support and to offload joints with strengthening exercises. Viscosity of the water is needed for resistance of strengthening; water current perturbations provides challenge to standing balance unsupported, requiring increased core activation.         Plan - 01/05/21 1051     Clinical Impression Statement Pt continues with complince of walking program, up to 30 mins 4 x weekly.  Walked around mall over w/e without discomfort.  Has only stiffness today.  Added resisted left knee flex using  water band.  Was difficulty forpt to coordinate but she reports decreased stiffness upon completion.  Did increase strengthening ex as session progresses.  She tolerated very well.             Patient will benefit from skilled therapeutic intervention in order to improve the following deficits and impairments:     Visit Diagnosis: Difficulty in walking, not elsewhere classified  Muscle weakness (generalized)  Chronic pain of left knee     Problem List Patient Active Problem List   Diagnosis Date Noted   Primary osteoarthritis of left knee 04/20/2014   Obesity (BMI 30-39.9) 02/06/2014   Anemia 08/08/2013   Asthma 08/08/2013   Bilateral knee pain 08/08/2013   Right foot pain 08/08/2013   Essential hypertension 06/23/2013   S/P hysterectomy - [facility]  10/07/2012    Menorrhagia with fibroids 10/07/2012    Annamarie Major) Marieme Mcmackin MPT  01/05/2021, 1:40 PM  [facility] [address] [place], [place], [address] Phone: [phone]   Fax:  [phone]  Name: [name] MRN: [id number] Date of Birth: 10-Aug-1960

## 2021-01-06 ENCOUNTER — Encounter: Payer: Self-pay | Admitting: Skilled Nursing Facility1

## 2021-01-06 ENCOUNTER — Encounter: Payer: BC Managed Care – PPO | Attending: Surgery | Admitting: Skilled Nursing Facility1

## 2021-01-06 DIAGNOSIS — E669 Obesity, unspecified: Secondary | ICD-10-CM | POA: Diagnosis not present

## 2021-01-06 NOTE — Progress Notes (Signed)
Nutrition Assessment for Bariatric Surgery Medical Nutrition Therapy Appt Start Time: 10:50    End Time: 11:52  Patient was seen on 01/06/2021 for Pre-Operative Nutrition Assessment. Letter of approval faxed to [facility] bariatric surgery program coordinator on 01/06/2021.   Referral stated Supervised Weight Loss (SWL) visits needed: 0  Pt completed visits.   Pt has cleared nutrition requirements.    Planned surgery: sleeve gastrectomy  Pt expectation of surgery: to lose weight Pt expectation of dietitian: none identified     NUTRITION ASSESSMENT   Anthropometrics  Start weight at [facility]: 265 lbs (date: 01/06/2021)  Height: 67 in BMI: 41.50 kg/m2     Clinical  Medical hx: HTN, prediabetes Medications: see list  Labs: A1C 6.0, vitamin D 15.5 Notable signs/symptoms: none reported  Any previous deficiencies? Yes: vitamin D (advised to go to her doctor to get prescription strength)  Micronutrient Nutrition Focused Physical Exam: Hair: No issues observed Eyes: No issues observed Mouth: No issues observed Neck: No issues observed Nails: No issues observed Skin: No issues observed  Lifestyle & Dietary Hx  Pt states she has been working more water and walking more.  Pt states she recently moved her dad in with her to be his main care taker. Pt states her father does PACE which he loves.  Pt states she has been working on cooking more meals from home.  Pt states she has been working on her portion sizes which she feels her stomach has shrunk.   24-Hr Dietary Recall First Meal: dry cereal Snack:  Second Meal: fast food or salad Snack: beef jerky Third Meal: pintos or lasagna + veggies  Snack: ritz crackers + cheese Beverages: sweet tea, hot tea, water   Estimated Energy Needs Calories: 1500   NUTRITION DIAGNOSIS  Overweight/obesity ([place]-3.3) related to past poor dietary habits and physical inactivity as evidenced by patient w/ planned sleeve gastrectomy  surgery following dietary guidelines for continued weight loss.    NUTRITION INTERVENTION  Nutrition counseling (C-1) and education (E-2) to facilitate bariatric surgery goals.  Educated pt on micronutrient deficiencies post surgery and strategies to mitigate that risk   Pre-Op Goals Reviewed with the Patient Track food and beverage intake (pen and paper, MyFitness Pal, Baritastic app, etc.) Make healthy food choices while monitoring portion sizes Consume 3 meals per day or try to eat every 3-5 hours Avoid concentrated sugars and fried foods Keep sugar & fat in the single digits per serving on food labels Practice CHEWING your food (aim for applesauce consistency) Practice not drinking 15 minutes before, during, and 30 minutes after each meal and snack Avoid all carbonated beverages (ex: soda, sparkling beverages)  Limit caffeinated beverages (ex: coffee, tea, energy drinks) Avoid all sugar-sweetened beverages (ex: regular soda, sports drinks)  Avoid alcohol  Aim for 64-100 ounces of FLUID daily (with at least half of fluid intake being plain water)  Aim for at least 60-80 grams of PROTEIN daily Look for a liquid protein source that contains ?15 g protein and ?5 g carbohydrate (ex: shakes, drinks, shots) Make a list of non-food related activities Physical activity is an important part of a healthy lifestyle so keep it moving! The goal is to reach 150 minutes of exercise per week, including cardiovascular and weight baring activity.  *Goals that are bolded indicate the pt would like to start working towards these  Handouts Provided Include  Bariatric Surgery handouts (Nutrition Visits, Pre-Op Goals, Protein Shakes, Vitamins & Minerals)  Learning Style & Readiness for  Change Teaching method utilized: Visual & Auditory  Demonstrated degree of understanding via: Teach Back  Readiness Level: Action Barriers to learning/adherence to lifestyle change: none identified   MONITORING &  EVALUATION Dietary intake, weekly physical activity, body weight, and pre-op goals reached at next nutrition visit.    Next Steps  Patient is to follow up at [facility] for Pre-Op Class >2 weeks before surgery for further nutrition education.  Pt has completed visits. No further supervised visits required/recomended

## 2021-01-06 NOTE — Progress Notes (Signed)
Nutrition Assessment for Bariatric Surgery Medical Nutrition Therapy   Patient was seen on 01/06/2021 for Pre-Operative Nutrition Assessment. Letter of approval faxed to [facility] bariatric surgery program coordinator on 01/06/2021.   Referral stated Supervised Weight Loss (SWL) visits needed: 0  Pt completed visits.   Pt has cleared nutrition requirements.    Planned surgery: sleeve gastrectomy  Pt expectation of surgery: to lose weight Pt expectation of dietitian: none identified     NUTRITION ASSESSMENT   Anthropometrics  Start weight at [facility]: 265 lbs (date: 01/06/2021)  Height: 67 in BMI: 41.50 kg/m2     Clinical  Medical hx: HTN, prediabetes Medications: see list  Labs: A1C 6.0, vitamin D 15.5 Notable signs/symptoms: none reported  Any previous deficiencies? Yes: vitamin D (advised to go to her doctor to get prescription strength)  Micronutrient Nutrition Focused Physical Exam: Hair: No issues observed Eyes: No issues observed Mouth: No issues observed Neck: No issues observed Nails: No issues observed Skin: No issues observed  Lifestyle & Dietary Hx  Pt states she has been working more water and walking more.  Pt states she recently moved her dad in with her to be his main care taker. Pt states her father does PACE which he loves.  Pt states she has been working on cooking more meals from home.  Pt states she has been working on her portion sizes which she feels her stomach has shrunk.   24-Hr Dietary Recall First Meal: dry cereal Snack:  Second Meal: fast food or salad Snack: beef jerky Third Meal: pintos or lasagna + veggies  Snack: ritz crackers + cheese Beverages: sweet tea, hot tea, water   Estimated Energy Needs Calories: 1500   NUTRITION DIAGNOSIS  Overweight/obesity ([place]-3.3) related to past poor dietary habits and physical inactivity as evidenced by patient w/ planned sleeve gastrectomy surgery following dietary guidelines for  continued weight loss.    NUTRITION INTERVENTION  Nutrition counseling (C-1) and education (E-2) to facilitate bariatric surgery goals.  Educated pt on micronutrient deficiencies post surgery and strategies to mitigate that risk   Pre-Op Goals Reviewed with the Patient Track food and beverage intake (pen and paper, MyFitness Pal, Baritastic app, etc.) Make healthy food choices while monitoring portion sizes Consume 3 meals per day or try to eat every 3-5 hours Avoid concentrated sugars and fried foods Keep sugar & fat in the single digits per serving on food labels Practice CHEWING your food (aim for applesauce consistency) Practice not drinking 15 minutes before, during, and 30 minutes after each meal and snack Avoid all carbonated beverages (ex: soda, sparkling beverages)  Limit caffeinated beverages (ex: coffee, tea, energy drinks) Avoid all sugar-sweetened beverages (ex: regular soda, sports drinks)  Avoid alcohol  Aim for 64-100 ounces of FLUID daily (with at least half of fluid intake being plain water)  Aim for at least 60-80 grams of PROTEIN daily Look for a liquid protein source that contains ?15 g protein and ?5 g carbohydrate (ex: shakes, drinks, shots) Make a list of non-food related activities Physical activity is an important part of a healthy lifestyle so keep it moving! The goal is to reach 150 minutes of exercise per week, including cardiovascular and weight baring activity.  *Goals that are bolded indicate the pt would like to start working towards these  Handouts Provided Include  Bariatric Surgery handouts (Nutrition Visits, Pre-Op Goals, Protein Shakes, Vitamins & Minerals)  Learning Style & Readiness for Change Teaching method utilized: Visual & Auditory  Demonstrated  degree of understanding via: Teach Back  Readiness Level: Action Barriers to learning/adherence to lifestyle change: none identified   MONITORING & EVALUATION Dietary intake, weekly physical  activity, body weight, and pre-op goals reached at next nutrition visit.    Next Steps  Patient is to follow up at [facility] for Pre-Op Class >2 weeks before surgery for further nutrition education.  Pt has completed visits. No further supervised visits required/recomended

## 2021-01-10 ENCOUNTER — Other Ambulatory Visit: Payer: Self-pay

## 2021-01-10 ENCOUNTER — Telehealth: Payer: Self-pay | Admitting: Skilled Nursing Facility1

## 2021-01-10 ENCOUNTER — Encounter (HOSPITAL_BASED_OUTPATIENT_CLINIC_OR_DEPARTMENT_OTHER): Payer: Self-pay | Admitting: Physical Therapy

## 2021-01-10 ENCOUNTER — Ambulatory Visit (HOSPITAL_BASED_OUTPATIENT_CLINIC_OR_DEPARTMENT_OTHER): Payer: BC Managed Care – PPO | Attending: Surgery | Admitting: Physical Therapy

## 2021-01-10 DIAGNOSIS — R262 Difficulty in walking, not elsewhere classified: Secondary | ICD-10-CM | POA: Diagnosis not present

## 2021-01-10 DIAGNOSIS — R2681 Unsteadiness on feet: Secondary | ICD-10-CM | POA: Diagnosis present

## 2021-01-10 DIAGNOSIS — R293 Abnormal posture: Secondary | ICD-10-CM | POA: Diagnosis present

## 2021-01-10 DIAGNOSIS — M25562 Pain in left knee: Secondary | ICD-10-CM | POA: Diagnosis present

## 2021-01-10 DIAGNOSIS — M6281 Muscle weakness (generalized): Secondary | ICD-10-CM | POA: Insufficient documentation

## 2021-01-10 DIAGNOSIS — M25561 Pain in right knee: Secondary | ICD-10-CM | POA: Diagnosis present

## 2021-01-10 DIAGNOSIS — G8929 Other chronic pain: Secondary | ICD-10-CM | POA: Diagnosis present

## 2021-01-10 DIAGNOSIS — R2689 Other abnormalities of gait and mobility: Secondary | ICD-10-CM | POA: Diagnosis present

## 2021-01-10 NOTE — Telephone Encounter (Signed)
Returned pt's call. LVM.
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
## 2021-01-10 NOTE — Telephone Encounter (Signed)
Pt returns call.   Pt states her physician needs proof her Vitamin D is low. Dietitian advised she can see it in her care everywhere so her physician can see it through care everywhere, or you as the pt can pull it up on your phone, or you can have [facility] send those labs to them.

## 2021-01-12 ENCOUNTER — Other Ambulatory Visit: Payer: Self-pay

## 2021-01-12 ENCOUNTER — Ambulatory Visit (HOSPITAL_BASED_OUTPATIENT_CLINIC_OR_DEPARTMENT_OTHER): Payer: BC Managed Care – PPO | Admitting: Physical Therapy

## 2021-01-12 ENCOUNTER — Encounter (HOSPITAL_BASED_OUTPATIENT_CLINIC_OR_DEPARTMENT_OTHER): Payer: Self-pay | Admitting: Physical Therapy

## 2021-01-12 DIAGNOSIS — M6281 Muscle weakness (generalized): Secondary | ICD-10-CM

## 2021-01-12 DIAGNOSIS — G8929 Other chronic pain: Secondary | ICD-10-CM

## 2021-01-12 DIAGNOSIS — M25562 Pain in left knee: Secondary | ICD-10-CM

## 2021-01-12 DIAGNOSIS — E559 Vitamin D deficiency, unspecified: Secondary | ICD-10-CM | POA: Insufficient documentation

## 2021-01-12 DIAGNOSIS — R262 Difficulty in walking, not elsewhere classified: Secondary | ICD-10-CM

## 2021-01-12 NOTE — Therapy (Signed)
[place], [place], [address] Phone: [phone]   Fax:  [phone]  Physical Therapy Treatment  Patient Details  Name: [name] MRN: [id number] Date of Birth: May 28, 1960 Referring Provider (PT): Clovis Riley, MD   Encounter Date: 01/12/2021   PT End of Session - 01/12/21 1104     Visit Number 7    Number of Visits 7    Date for PT Re-Evaluation 01/21/21    PT Start Time 5462    PT Stop Time 1116    PT Time Calculation (min) 41 min    Equipment Utilized During Treatment Other (comment)    Activity Tolerance Patient tolerated treatment well    Behavior During Therapy [facility] for tasks assessed/performed             Past Medical History:  Diagnosis Date   Hypertension    followed by pcp   Incisional hernia    OA (osteoarthritis)     Past Surgical History:  Procedure Laterality Date   BUNIONECTOMY Right 2020   great toe bunionectomy/  3rd toe pinning of fracture   CARPAL TUNNEL RELEASE Right [place] N/A 05/19/2020   Procedure: [place];  Surgeon: Coralie Keens, MD;  Location: [place];  Service: General;  Laterality: N/A;   LAPAROSCOPIC BILATERAL SALPINGECTOMY Bilateral 10/07/2012   Procedure: LAPAROSCOPIC BILATERAL SALPINGECTOMY;  Surgeon: Marvene Staff, MD;  Location: [facility];  Service: Gynecology;  Laterality: Bilateral;   ROBOTIC ASSISTED LAPAROSCOPIC LYSIS OF ADHESION N/A 10/07/2012   Procedure: ROBOTIC ASSISTED LAPAROSCOPIC LYSIS OF ADHESION;  Surgeon: Marvene Staff, MD;  Location: [facility];  Service: Gynecology;  Laterality: N/A;   ROBOTIC ASSISTED TOTAL HYSTERECTOMY N/A 10/07/2012   Procedure: ROBOTIC ASSISTED TOTAL HYSTERECTOMY;  Surgeon: Marvene Staff, MD;  Location: [facility];  Service: Gynecology;  Laterality: N/A;    There were no vitals filed for this  visit.                                 PT Short Term Goals - 12/21/20 1159       PT SHORT TERM GOAL #1   Title pt will be consistently doing her walking program    Status Achieved               PT Long Term Goals - 12/07/20 1331       PT LONG TERM GOAL #1   Title pt will verbalize understanding of walking program moving forward    Baseline will educate and establish proper progression    Time 6    Period Weeks    Status New    Target Date 01/21/21      PT LONG TERM GOAL #2   Title 5TSTS to 14s or less    Baseline 17s at eval    Time 6    Period Weeks    Status New    Target Date 01/21/21      PT LONG TERM GOAL #3   Title pt will be comfortable with aquatic exercises and able to perform independently    Baseline requests aquatic HEP    Time 6    Period Weeks    Status New    Target Date 01/21/21  Pt seen for aquatic therapy today.  Treatment took place in water 3.25-4.8 ft in depth at the [facility] pool. Temp of water was 94.  Pt entered/exited the pool via stairs step to pattern independently with bilat rail.    Warm up: forward, backward and side stepping/walking cues for increased step length, increased speed, hand placement to increase resistance. Initially supported by blue noodle progressing to yellow then without. Multiple widths and lengths as pt uneasy upon entering water.    Seated  Stretches: -gastroc and hamstrings 3 x 25 second hold. -R Knee flex x 10 resisted by water band sitting on lift chair -R knee ext x 10 "     Standing  Step ups; forward and backward leading with R/L 2 x 10 -squats on bottom step x 10.  VC for weightbearing on heels for proper execution -Sit to stand from 3rd water step x 10 reps+ unsupported after 3-4 reps. UE unsupported;add/abd x10 -marching  2x 10 - hip flex x10 -hip ext x 10    Vc ,tc and demo for core engagement/activation throughout exercises.    Aerobic capacity  Water "running" forward and back with therapist encouraging increased speed and step length x 6 widths.      Pt requires buoyancy for support and to offload joints with strengthening exercises. Viscosity of the water is needed for resistance of strengthening; water current perturbations provides challenge to standing balance unsupported, requiring increased core activation.        Plan - 01/12/21 1109     Clinical Impression Statement Pt continues to report compliance with HEP.  She is motivated by her weight loss she has had in last month (7 lb). She has progressed in strength and endurance as evidenced by inceased reps and trials of all exercises and toleration of entire aquatic session with only short rest periods.  Pt tolerated precribed exercises without pain or increased difficuty.  Left knne pain decreased.  Pt reporting feeling stronger.             Patient will benefit from skilled therapeutic intervention in order to improve the following deficits and impairments:     Visit Diagnosis: Difficulty in walking, not elsewhere classified  Muscle weakness (generalized)  Chronic pain of left knee     Problem List Patient Active Problem List   Diagnosis Date Noted   Primary osteoarthritis of left knee 04/20/2014   Obesity (BMI 30-39.9) 02/06/2014   Anemia 08/08/2013   Asthma 08/08/2013   Bilateral knee pain 08/08/2013   Right foot pain 08/08/2013   Essential hypertension 06/23/2013   S/P hysterectomy - [place]  10/07/2012   Menorrhagia with fibroids 10/07/2012    Annamarie Major) Apostolos Blagg MPT   01/12/2021, 1:27 PM  [facility] [address] [place], [place], [address] Phone: [phone]   Fax:  [phone]  Name: [name] MRN: [id number] Date of Birth: 1961/03/25

## 2021-01-17 ENCOUNTER — Encounter (HOSPITAL_BASED_OUTPATIENT_CLINIC_OR_DEPARTMENT_OTHER): Payer: Self-pay | Admitting: Physical Therapy

## 2021-01-17 ENCOUNTER — Other Ambulatory Visit: Payer: Self-pay

## 2021-01-17 ENCOUNTER — Ambulatory Visit (HOSPITAL_BASED_OUTPATIENT_CLINIC_OR_DEPARTMENT_OTHER): Payer: BC Managed Care – PPO | Admitting: Physical Therapy

## 2021-01-17 DIAGNOSIS — M6281 Muscle weakness (generalized): Secondary | ICD-10-CM

## 2021-01-17 DIAGNOSIS — R262 Difficulty in walking, not elsewhere classified: Secondary | ICD-10-CM

## 2021-01-17 DIAGNOSIS — G8929 Other chronic pain: Secondary | ICD-10-CM

## 2021-01-17 DIAGNOSIS — M25562 Pain in left knee: Secondary | ICD-10-CM

## 2021-01-18 NOTE — Therapy (Signed)
[place], [place], [address] Phone: [phone]   Fax:  [phone]  Physical Therapy Treatment  Patient Details  Name: [name] MRN: [id number] Date of Birth: [birth year] Referring Provider (PT): Clovis Riley, MD   Encounter Date: 01/17/2021   PT End of Session - 01/17/21 1116     Visit Number 8    Number of Visits 10    Date for PT Re-Evaluation 01/31/21    Authorization Type [id number]    PT Start Time 1113    PT Stop Time 1149    PT Time Calculation (min) 36 min    Activity Tolerance Patient tolerated treatment well    Behavior During Therapy [facility] for tasks assessed/performed             Past Medical History:  Diagnosis Date   Hypertension    followed by pcp   Incisional hernia    OA (osteoarthritis)     Past Surgical History:  Procedure Laterality Date   BUNIONECTOMY Right 2020   great toe bunionectomy/  3rd toe pinning of fracture   CARPAL TUNNEL RELEASE Right [place] N/A 05/19/2020   Procedure: [place];  Surgeon: Coralie Keens, MD;  Location: [place];  Service: General;  Laterality: N/A;   LAPAROSCOPIC BILATERAL SALPINGECTOMY Bilateral 10/07/2012   Procedure: LAPAROSCOPIC BILATERAL SALPINGECTOMY;  Surgeon: Marvene Staff, MD;  Location: [facility];  Service: Gynecology;  Laterality: Bilateral;   ROBOTIC ASSISTED LAPAROSCOPIC LYSIS OF ADHESION N/A 10/07/2012   Procedure: ROBOTIC ASSISTED LAPAROSCOPIC LYSIS OF ADHESION;  Surgeon: Marvene Staff, MD;  Location: [facility];  Service: Gynecology;  Laterality: N/A;   ROBOTIC ASSISTED TOTAL HYSTERECTOMY N/A 10/07/2012   Procedure: ROBOTIC ASSISTED TOTAL HYSTERECTOMY;  Surgeon: Marvene Staff, MD;  Location: [facility];  Service: Gynecology;  Laterality: N/A;    There were no vitals filed for this visit.   Subjective Assessment -  01/17/21 1117     Subjective Pt reports she has been performing some of her HEP. Pt enjoys aquatic therapy and states it has been great for her. She is feeling much better since beginning PT including aquatic therapy.  Pt states she has improved in "everything".  Pt is able to ambulate for longer duration.  Pt states she is losing weight.  Pt reports improved L knee pain and improved walking.  Pt denies pain currently, just stiffness.  Pt is planning on bariatric surgery in December.  Pt states she was in a lot of pain when she first came, but she feels really good now.  pt began walking 10 mins at a time and is now walking 20-30 mins 4 days per week and wants to progress to 1 hr.  pt does not have an aquatic handout.    How long can you walk comfortably? 30 mins    Currently in Pain? No/denies    Pain Score --   4-5/10 Worst pain               [facility] PT Assessment - 01/18/21 0001       Observation/Other Assessments   Other Surveys  Lower Extremity Functional Scale    Lower Extremity Functional Scale  51/80      AROM   Overall AROM Comments Pt can reach overhead well with bilat UEs      Strength   Right Hip Flexion 5/5  Right Hip External Rotation  5/5    Left Hip Flexion 5/5    Left Hip External Rotation 5/5    Right Knee Flexion 5/5    Right Knee Extension 5/5    Left Knee Flexion 5/5    Left Knee Extension 5/5      Special Tests   Other special tests 5TSTS:  12 seconds      Ambulation/Gait   Gait Comments Rt knee genu valgus resulting in pes planus.                                    PT Education - 01/17/21 2122     Education Details Educated pt concerning objective findings, progress, and goal progress.  Educated pt concerning POC.    Person(s) Educated Patient    Methods Explanation    Comprehension Verbalized understanding              PT Short Term Goals - 01/17/21 1134       PT SHORT TERM GOAL #1   Title pt will be  consistently doing her walking program    Status Achieved               PT Long Term Goals - 01/17/21 1134       PT LONG TERM GOAL #1   Title pt will verbalize understanding of walking program moving forward    Period Weeks    Status Achieved    Target Date 01/21/21      PT LONG TERM GOAL #2   Title 5TSTS to 14s or less    Baseline 12 sec currently    Time 6    Period Weeks    Status Achieved    Target Date 01/21/21      PT LONG TERM GOAL #3   Title pt will be comfortable with aquatic exercises and able to perform independently    Time 6    Period Weeks    Status Partially Met    Target Date 01/21/21                   Plan - 01/17/21 1149     Clinical Impression Statement Pt enjoys aquatic therapy and reports significant improvement since performing walking program and aquatic therapy. Pt states she has improved in "everything". Pt reports improved worst pain to 4-5/10 which was 8-9/10 per Pt.  Pt is ambulating increased distance and has been compliant with walking program.  She is very excited about her progress.  Pt demonstrates improved 5x sit to stand test by 5 seconds.  Pt has met all goals except partially meeting LTG #3.   Pt to be seen for 2 more aquatic visits to give aquatic program and establish independence with aquatic HEP.  Pt is agreeable with POC.    Comorbidities OA, h/o foot & knee surgery, recent fall    PT Frequency --   2x this week and once next week   PT Duration 2 weeks    PT Treatment/Interventions ADLs/Self Care Home Management;Aquatic Therapy;Cryotherapy;Electrical Stimulation;Gait training;Moist Heat;Iontophoresis 38m/ml Dexamethasone;Stair training;Functional mobility training;Therapeutic activities;Therapeutic exercise;Neuromuscular re-education;Manual techniques;Patient/family education;Passive range of motion;Dry needling;Taping    PT Next Visit Plan 2 more aquatic visits.  Give aquatic HEP and then discharge.    PT Home Exercise  Plan [id number]    Consulted and Agree with Plan of Care Patient  Patient will benefit from skilled therapeutic intervention in order to improve the following deficits and impairments:  Abnormal gait, Difficulty walking, Decreased activity tolerance, Pain, Improper body mechanics, Decreased strength, Postural dysfunction  Visit Diagnosis: Difficulty in walking, not elsewhere classified  Muscle weakness (generalized)  Chronic pain of left knee     Problem List Patient Active Problem List   Diagnosis Date Noted   Primary osteoarthritis of left knee 04/20/2014   Obesity (BMI 30-39.9) 02/06/2014   Anemia 08/08/2013   Asthma 08/08/2013   Bilateral knee pain 08/08/2013   Right foot pain 08/08/2013   Essential hypertension 06/23/2013   S/P hysterectomy - [place]  10/07/2012   Menorrhagia with fibroids 10/07/2012    Selinda Michaels III PT, DPT 01/18/21 1:45 PM   [facility] [address] [place], [place], [address] Phone: [phone]   Fax:  [phone]  Name: [name] MRN: [id number] Date of Birth: [birth year]

## 2021-01-19 ENCOUNTER — Ambulatory Visit (HOSPITAL_BASED_OUTPATIENT_CLINIC_OR_DEPARTMENT_OTHER): Payer: BC Managed Care – PPO | Admitting: Physical Therapy

## 2021-01-19 ENCOUNTER — Other Ambulatory Visit: Payer: Self-pay

## 2021-01-19 DIAGNOSIS — R262 Difficulty in walking, not elsewhere classified: Secondary | ICD-10-CM | POA: Diagnosis not present

## 2021-01-19 DIAGNOSIS — M6281 Muscle weakness (generalized): Secondary | ICD-10-CM

## 2021-01-19 DIAGNOSIS — G8929 Other chronic pain: Secondary | ICD-10-CM

## 2021-01-19 DIAGNOSIS — M25562 Pain in left knee: Secondary | ICD-10-CM

## 2021-01-19 NOTE — Therapy (Signed)
[place], [place], [address] Phone: [phone]   Fax:  [phone]  Physical Therapy Treatment  Patient Details  Name: [name] MRN: [id number] Date of Birth: [birth year] Referring Provider (PT): Clovis Riley, MD   Encounter Date: 01/19/2021   PT End of Session - 01/19/21 1043     Visit Number 9    Number of Visits 10    Date for PT Re-Evaluation 01/31/21    Authorization Type BCBS 0VL    PT Start Time 1035    PT Stop Time 1115    PT Time Calculation (min) 40 min    Equipment Utilized During Treatment Other (comment)    Activity Tolerance Patient tolerated treatment well    Behavior During Therapy [facility] for tasks assessed/performed             Past Medical History:  Diagnosis Date   Hypertension    followed by pcp   Incisional hernia    OA (osteoarthritis)     Past Surgical History:  Procedure Laterality Date   BUNIONECTOMY Right 2020   great toe bunionectomy/  3rd toe pinning of fracture   CARPAL TUNNEL RELEASE Right [place] N/A 05/19/2020   Procedure: [place];  Surgeon: Coralie Keens, MD;  Location: [place];  Service: General;  Laterality: N/A;   LAPAROSCOPIC BILATERAL SALPINGECTOMY Bilateral 10/07/2012   Procedure: LAPAROSCOPIC BILATERAL SALPINGECTOMY;  Surgeon: Marvene Staff, MD;  Location: [facility];  Service: Gynecology;  Laterality: Bilateral;   ROBOTIC ASSISTED LAPAROSCOPIC LYSIS OF ADHESION N/A 10/07/2012   Procedure: ROBOTIC ASSISTED LAPAROSCOPIC LYSIS OF ADHESION;  Surgeon: Marvene Staff, MD;  Location: [facility];  Service: Gynecology;  Laterality: N/A;   ROBOTIC ASSISTED TOTAL HYSTERECTOMY N/A 10/07/2012   Procedure: ROBOTIC ASSISTED TOTAL HYSTERECTOMY;  Surgeon: Marvene Staff, MD;  Location: [facility];  Service: Gynecology;  Laterality: N/A;    There were no vitals  filed for this visit.   Subjective Assessment - 01/19/21 1102     Subjective "I get so excited because my knee feels so much better"    Pain Score 0-No pain                                          PT Short Term Goals - 01/17/21 1134       PT SHORT TERM GOAL #1   Title pt will be consistently doing her walking program    Status Achieved               PT Long Term Goals - 01/17/21 1134       PT LONG TERM GOAL #1   Title pt will verbalize understanding of walking program moving forward    Period Weeks    Status Achieved    Target Date 01/21/21      PT LONG TERM GOAL #2   Title 5TSTS to 14s or less    Baseline 12 sec currently    Time 6    Period Weeks    Status Achieved    Target Date 01/21/21      PT LONG TERM GOAL #3   Title pt will be comfortable with aquatic exercises and able to perform independently    Time 6    Period Weeks  Status Partially Met    Target Date 01/21/21               Pt seen for aquatic therapy today.  Treatment took place in water 3.25-4.8 ft in depth at the [facility] pool. Temp of water was 94.  Pt entered/exited the pool via stairs step to pattern independently with bilat rail.     Warm up: forward, backward and side stepping/walking cues for increased step length, increased speed, hand placement to increase resistance. Initially supported by blue noodle progressing to yellow then without. Multiple widths and lengths as pt uneasy upon entering water.    Seated  Stretches: -gastroc and hamstrings 3 x 25 second hold. -R Knee flex 2 x 10 resisted by water vc for increased speed -flutter kicking     Standing  Step ups; forward and backward leading with R/L  x 10 -squats on bottom step x 10.  VC for weightbearing on heels for proper execution -Sit to stand from 3rd water step x 10 reps+ unsupported after 3-4 reps. UE unsupported;add/abd x10 -marching  2x 10 - hip flex 2x10 -hip  ext 2x 10     Vc ,tc and demo for core engagement/activation throughout exercises.   Aerobic capacity  Water "running" forward and back with therapist encouraging increased speed and step length x 6 widths.  Pulse 104, O2 sats 97 post activity     Pt requires buoyancy for support and to offload joints with strengthening exercises. Viscosity of the water is needed for resistance of strengthening; water current perturbations provides challenge to standing balance unsupported, requiring increased core activation.        Plan - 01/19/21 1055     Clinical Impression Statement Pt instructed on HEP sequence.  Pt reports not having access to pool now but may in Dec. HEP lamenated with explaination and demo for proper completion.  Increased sets of some exercises as tolerated. Pt demonstrates improvement with strength and coordination with immediate indep with sit<>stand from 3rd water step. O2 sats and Pulse wfl post aerobic capacity exercise "jogging"    Personal Factors and Comorbidities Fitness;Finances;Comorbidity 3+    PT Treatment/Interventions ADLs/Self Care Home Management;Aquatic Therapy;Cryotherapy;Electrical Stimulation;Gait training;Moist Heat;Iontophoresis 55m/ml Dexamethasone;Stair training;Functional mobility training;Therapeutic activities;Therapeutic exercise;Neuromuscular re-education;Manual techniques;Patient/family education;Passive range of motion;Dry needling;Taping    PT Home Exercise Plan [id number] added aquatics             Patient will benefit from skilled therapeutic intervention in order to improve the following deficits and impairments:  Abnormal gait, Difficulty walking, Decreased activity tolerance, Pain, Improper body mechanics, Decreased strength, Postural dysfunction  Visit Diagnosis: Muscle weakness (generalized)  Chronic pain of left knee  Difficulty in walking, not elsewhere classified     Problem List Patient Active Problem List   Diagnosis Date  Noted   Primary osteoarthritis of left knee 04/20/2014   Obesity (BMI 30-39.9) 02/06/2014   Anemia 08/08/2013   Asthma 08/08/2013   Bilateral knee pain 08/08/2013   Right foot pain 08/08/2013   Essential hypertension 06/23/2013   S/P hysterectomy - [facility]inci  10/07/2012   Menorrhagia with fibroids 10/07/2012    MAnnamarie Major [name] MPT 01/19/2021, 11:19 AM  [place] [address]Phone: [phone]  Fax:  [phone] Name: Dawn KadowMRN: [id number] of Birth: 8January 27, 1962

## 2021-01-24 ENCOUNTER — Encounter (HOSPITAL_BASED_OUTPATIENT_CLINIC_OR_DEPARTMENT_OTHER): Payer: Self-pay | Admitting: Physical Therapy

## 2021-01-26 ENCOUNTER — Ambulatory Visit (HOSPITAL_BASED_OUTPATIENT_CLINIC_OR_DEPARTMENT_OTHER): Payer: BC Managed Care – PPO | Admitting: Physical Therapy

## 2021-01-26 ENCOUNTER — Other Ambulatory Visit: Payer: Self-pay

## 2021-01-26 ENCOUNTER — Encounter (HOSPITAL_BASED_OUTPATIENT_CLINIC_OR_DEPARTMENT_OTHER): Payer: Self-pay | Admitting: Physical Therapy

## 2021-01-26 DIAGNOSIS — R262 Difficulty in walking, not elsewhere classified: Secondary | ICD-10-CM | POA: Diagnosis not present

## 2021-01-26 DIAGNOSIS — G8929 Other chronic pain: Secondary | ICD-10-CM

## 2021-01-26 DIAGNOSIS — R293 Abnormal posture: Secondary | ICD-10-CM

## 2021-01-26 DIAGNOSIS — R2689 Other abnormalities of gait and mobility: Secondary | ICD-10-CM

## 2021-01-26 DIAGNOSIS — M25561 Pain in right knee: Secondary | ICD-10-CM

## 2021-01-26 DIAGNOSIS — R2681 Unsteadiness on feet: Secondary | ICD-10-CM

## 2021-01-26 DIAGNOSIS — M6281 Muscle weakness (generalized): Secondary | ICD-10-CM

## 2021-01-26 NOTE — Therapy (Signed)
[place], [place], [address] Phone: [phone]   Fax:  [phone]  Physical Therapy Treatment  Patient Details  Name: [name] MRN: [id number] Date of Birth: December 23, 1960 Referring Provider (PT): Clovis Riley, MD   Encounter Date: 01/26/2021   PT End of Session - 01/26/21 1041     Visit Number 10    Number of Visits 10    Date for PT Re-Evaluation 01/31/21    Authorization Type BCBS 0VL    PT Start Time 1035    PT Stop Time 1115    PT Time Calculation (min) 40 min    Equipment Utilized During Treatment Other (comment)    Activity Tolerance Patient tolerated treatment well    Behavior During Therapy [facility] for tasks assessed/performed             Past Medical History:  Diagnosis Date   Hypertension    followed by pcp   Incisional hernia    OA (osteoarthritis)     Past Surgical History:  Procedure Laterality Date   BUNIONECTOMY Right 2020   great toe bunionectomy/  3rd toe pinning of fracture   CARPAL TUNNEL RELEASE Right [place] N/A 05/19/2020   Procedure: [place];  Surgeon: Coralie Keens, MD;  Location: [place];  Service: General;  Laterality: N/A;   LAPAROSCOPIC BILATERAL SALPINGECTOMY Bilateral 10/07/2012   Procedure: LAPAROSCOPIC BILATERAL SALPINGECTOMY;  Surgeon: Marvene Staff, MD;  Location: [facility];  Service: Gynecology;  Laterality: Bilateral;   ROBOTIC ASSISTED LAPAROSCOPIC LYSIS OF ADHESION N/A 10/07/2012   Procedure: ROBOTIC ASSISTED LAPAROSCOPIC LYSIS OF ADHESION;  Surgeon: Marvene Staff, MD;  Location: [facility];  Service: Gynecology;  Laterality: N/A;   ROBOTIC ASSISTED TOTAL HYSTERECTOMY N/A 10/07/2012   Procedure: ROBOTIC ASSISTED TOTAL HYSTERECTOMY;  Surgeon: Marvene Staff, MD;  Location: [facility];  Service: Gynecology;  Laterality: N/A;    There were no vitals  filed for this visit.   Subjective Assessment - 01/26/21 1410     Subjective "It's my last visit, looking forward to my surgery"    Currently in Pain? No/denies                                          PT Short Term Goals - 01/17/21 1134       PT SHORT TERM GOAL #1   Title pt will be consistently doing her walking program    Status Achieved               PT Long Term Goals - 01/26/21 1113       PT LONG TERM GOAL #3   Baseline Pt has recieved a copy with instruction verbally, tactile and demonstration on proper completion.    Status Achieved      PT LONG TERM GOAL #4   Status On-going      PT LONG TERM GOAL #5   Status Achieved           Pt seen for aquatic therapy today.  Treatment took place in water 3.25-4.8 ft in depth at the [facility] pool. Temp of water was 94.  Pt entered/exited the pool via stairs step to pattern independently with bilat rail.     Warm up: forward, backward and side stepping/walking cues for  increased step length, increased speed, hand placement to increase resistance. Initially supported by blue noodle progressing to yellow then without. Multiple widths and lengths as pt uneasy upon entering water.    Seated  Stretches: -gastroc and hamstrings 3 x 25 second hold. -adductor stretch 3x 30 sec     Standing  Step ups; forward and backward leading with R/L  x 10  UE minimal ue supported needed today -add/abd x10 -marching  2x 15 - hip flex 2x10 -hip ext 2x 10   Kick board push downs 2 x 15 with manual perturbation   Vc and demo for core engagement/activation throughout exercises.   Aerobic capacity  Water "running" forward and back with therapist encouraging increased speed and step length x 6 widths.    HEP covered with pt to ensure understanding.  She VU.        Plan - 01/26/21 1044     Clinical Impression Statement Further oraganized HEP for pt to ensure understanding and  indep with completion. She has met all goals and is ready for DC.    PT Treatment/Interventions ADLs/Self Care Home Management;Aquatic Therapy;Cryotherapy;Electrical Stimulation;Gait training;Moist Heat;Iontophoresis 8m/ml Dexamethasone;Stair training;Functional mobility training;Therapeutic activities;Therapeutic exercise;Neuromuscular re-education;Manual techniques;Patient/family education;Passive range of motion;Dry needling;Taping             Patient will benefit from skilled therapeutic intervention in order to improve the following deficits and impairments:  Abnormal gait, Difficulty walking, Decreased activity tolerance, Pain, Improper body mechanics, Decreased strength, Postural dysfunction  Visit Diagnosis: Abnormal posture  Chronic pain of right knee  Difficulty in walking, not elsewhere classified  Muscle weakness (generalized)  Unsteadiness on feet  Other abnormalities of gait and mobility     Problem List Patient Active Problem List   Diagnosis Date Noted   Primary osteoarthritis of left knee 04/20/2014   Obesity (BMI 30-39.9) 02/06/2014   Anemia 08/08/2013   Asthma 08/08/2013   Bilateral knee pain 08/08/2013   Right foot pain 08/08/2013   Essential hypertension 06/23/2013   S/P hysterectomy - [facility]inci  10/07/2012   Menorrhagia with fibroids 10/07/2012   PHYSICAL THERAPY DISCHARGE SUMMARY  Visits from Start of Care: 12/07/2020  Current functional level related to goals / functional outcomes: Pt indep with all functional mobility and ADL's. Completing walking program daily.   Remaining deficits: morbid obesity   Education / Equipment: HEP: pt has received a laminated copy   Patient agrees to discharge. Patient goals were met. Patient is being discharged due to meeting the stated rehab goals.  [address]([name] MPT  01/26/2021, 2:13 PM  [facility] [address]  [address]Phone: [phone]  Fax:  [phone] Name: Dawn FanninMRN: [id number] of Birth: 811/22/1962

## 2021-01-27 ENCOUNTER — Encounter (HOSPITAL_BASED_OUTPATIENT_CLINIC_OR_DEPARTMENT_OTHER): Payer: Self-pay | Admitting: Physical Therapy

## 2021-02-11 ENCOUNTER — Ambulatory Visit (INDEPENDENT_AMBULATORY_CARE_PROVIDER_SITE_OTHER): Payer: BC Managed Care – PPO | Admitting: Psychology

## 2021-02-23 ENCOUNTER — Ambulatory Visit (INDEPENDENT_AMBULATORY_CARE_PROVIDER_SITE_OTHER): Payer: BC Managed Care – PPO | Admitting: Psychology

## 2021-02-23 DIAGNOSIS — F509 Eating disorder, unspecified: Secondary | ICD-10-CM | POA: Diagnosis not present

## 2021-03-01 ENCOUNTER — Ambulatory Visit: Payer: BC Managed Care – PPO | Admitting: Psychology

## 2021-03-15 ENCOUNTER — Ambulatory Visit: Payer: BC Managed Care – PPO | Admitting: Psychology

## 2021-04-11 ENCOUNTER — Encounter: Payer: BC Managed Care – PPO | Attending: Surgery | Admitting: Skilled Nursing Facility1

## 2021-04-11 DIAGNOSIS — E669 Obesity, unspecified: Secondary | ICD-10-CM | POA: Insufficient documentation

## 2021-04-12 ENCOUNTER — Other Ambulatory Visit: Payer: Self-pay

## 2021-04-12 NOTE — Progress Notes (Signed)
Pre-Operative Nutrition Class:    Patient was seen on 04/12/2021 for Pre-Operative Bariatric Surgery Education at the [facility].    Surgery date:  Surgery type: sleeve Start weight at [facility]: 265 Weight today: 267.2  The following the learning objectives were met by the patient during this course: Identify Pre-Op Dietary Goals and will begin 2 weeks pre-operatively Identify appropriate sources of fluids and proteins  State protein recommendations and appropriate sources pre and post-operatively Identify Post-Operative Dietary Goals and will follow for 2 weeks post-operatively Identify appropriate multivitamin and calcium sources Describe the need for physical activity post-operatively and will follow MD recommendations State when to call healthcare provider regarding medication questions or post-operative complications When having a diagnosis of diabetes understanding hypoglycemia symptoms and the inclusion of 1 complex carbohydrate per meal  Handouts given during class include: Pre-Op Bariatric Surgery Diet Handout Protein Shake Handout Post-Op Bariatric Surgery Nutrition Handout BELT Program Information Flyer Support Group Information Flyer [facility] Bariatric Supplements Price List  Follow-Up Plan: Patient will follow-up at [facility] 2 weeks post operatively for diet advancement per MD.

## 2021-04-21 ENCOUNTER — Ambulatory Visit: Payer: Self-pay | Admitting: Surgery

## 2021-04-21 NOTE — H&P (View-Only) (Signed)
Surgical Evaluation  Chief Complaint: pre-op bariatric surgery       History of Present Illness: Returns for follow-up regarding surgical treatment of morbid obesity.  Denies any changes in her health since our initial visit and has continued to work diligently on her health.  Has completed the bariatric pathway with no barriers noted notified. CXR/UGI- small sliding HH Dietician- approved   Initial visit 11/18/20:Very pleasant 61 year old woman with history of hypertension, hyperlipidemia, osteoarthritis, prediabetes, chronic knee pain presents for consultation regarding surgical management of severe obesity.  She has tried multiple methods of weight loss in the past including ketogenic diet, Slim fast, a walking program, [employer].  She has not tried medications.  In general she has had limited success with previous efforts.  Obesity does run in her family in fact she has had several family members including her sister, nieces and several cousins who have had bariatric surgery (most of them sleeve) with great success. She is interested in surgical treatment for her obesity in order to improve the quality and quantity of her life, improve her blood pressure, avoid developing further complications with her prediabetes, and to reduce the pain from her knees (she had her left knee replaced 5 or 6 years ago and gained a substantial amount of weight after that)   Previous abdominal surgeries include robotic hysterectomy with lysis of adhesions in 2014, hernia repair in 1996, and incisional hernia repair in February of this year by Dr. Ninfa Linden (note describes a 2 cm fascial defect containing omental fat, placement of a 6.4 cm circular underlay mesh secured with Novofils and fascial closure with interrupted figure-of-eight #1 no refills.   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  She is hopeful to complete the process and undergo surgery around the week before Thanksgiving so that she will  not have to take too much time off of work.  Allergies  Allergen Reactions   Aspirin Other (See Comments)    Severe stomach pain   Codeine Nausea And Vomiting   Cyclobenzaprine Other (See Comments)    Gi upset   Ibuprofen Other (See Comments)    Severe stomach pain   Penicillins Hives   Latex Rash    Past Medical History:  Diagnosis Date   Hypertension    followed by pcp   Incisional hernia    OA (osteoarthritis)     Past Surgical History:  Procedure Laterality Date   BUNIONECTOMY Right 2020   great toe bunionectomy/  3rd toe pinning of fracture   CARPAL TUNNEL RELEASE Right [place] N/A 05/19/2020   Procedure: OPEN HERNIA REPAIR INCISIONAL;  Surgeon: Coralie Keens, MD;  Location: [facility];  Service: General;  Laterality: N/A;   LAPAROSCOPIC BILATERAL SALPINGECTOMY Bilateral 10/07/2012   Procedure: LAPAROSCOPIC BILATERAL SALPINGECTOMY;  Surgeon: Marvene Staff, MD;  Location: [facility];  Service: Gynecology;  Laterality: Bilateral;   ROBOTIC ASSISTED LAPAROSCOPIC LYSIS OF ADHESION N/A 10/07/2012   Procedure: ROBOTIC ASSISTED LAPAROSCOPIC LYSIS OF ADHESION;  Surgeon: Marvene Staff, MD;  Location: [facility];  Service: Gynecology;  Laterality: N/A;   ROBOTIC ASSISTED TOTAL HYSTERECTOMY N/A 10/07/2012   Procedure: ROBOTIC ASSISTED TOTAL HYSTERECTOMY;  Surgeon: Marvene Staff, MD;  Location: [facility];  Service: Gynecology;  Laterality: N/A;    No family history on file.  Social History   Socioeconomic History   Marital status: Married    Spouse name: Not on  file   Number of children: Not on file   Years of education: Not on file   Highest education level: Not on file  Occupational History   Not on file  Tobacco Use   Smoking status: Never   Smokeless tobacco: Never  Vaping Use   Vaping Use: Never used  Substance and Sexual Activity   Alcohol use: No   Drug use: Never   Sexual activity: Yes     Birth control/protection: Surgical  Other Topics Concern   Not on file  Social History Narrative   Not on file   Social Determinants of Health   Financial Resource Strain: Not on file  Food Insecurity: Not on file  Transportation Needs: Not on file  Physical Activity: Not on file  Stress: Not on file  Social Connections: Not on file    Current Outpatient Medications on File Prior to Visit  Medication Sig Dispense Refill   amLODipine (NORVASC) 10 MG tablet Take 10 mg by mouth daily.     lisinopril (PRINIVIL,ZESTRIL) 40 MG tablet Take 40 mg by mouth daily.  2   No current facility-administered medications on file prior to visit.    Review of Systems: a complete, 10pt review of systems was completed with pertinent positives and negatives as documented in the HPI  Physical Exam: BP: (!) 140/80  Pulse: (!) 112  Temp: 36.8 C (98.3 F)  SpO2: 93%  Weight: (!) 119.9 kg (264 lb 6.4 oz)  Height: 170.2 cm (5\' 7" )    Body mass index is 41.41 kg/m.   Gen: A&Ox3, no distress  Unlabored respiration   CBC Latest Ref Rng & Units 05/19/2020 10/08/2012 10/03/2012  WBC 4.0 - 10.5 K/uL - 15.3(H) 4.0  Hemoglobin 12.0 - 15.0 g/dL 12.9 9.9(L) 10.9(L)  Hematocrit 36.0 - 46.0 % 38.0 31.0(L) 34.0(L)  Platelets 150 - 400 K/uL - 209 216    CMP Latest Ref Rng & Units 05/19/2020 10/08/2012 10/03/2012  Glucose 70 - 99 mg/dL 109(H) 132(H) 99  BUN 6 - 20 mg/dL 13 12 11   Creatinine 0.44 - 1.00 mg/dL 1.00 0.98 0.89  Sodium 135 - 145 mmol/L 140 135 139  Potassium 3.5 - 5.1 mmol/L 4.2 4.4 4.1  Chloride 98 - 111 mmol/L 102 103 105  CO2 19 - 32 mEq/L - 24 25  Calcium 8.4 - 10.5 mg/dL - 9.4 9.3    No results found for: INR, PROTIME  Imaging: No results found.   A/P: Morbid obesity (CMS-HCC)     She remains a good candidate for sleeve gastrectomy, and that is what she is interested in. We again discussed the surgery including technical aspects, the risks of bleeding, infection, pain, scarring,  injury to intra-abdominal structures, staple line leak or abscess, chronic abdominal pain or nausea, new onset or worsened GERD, DVT/PE, pneumonia, heart attack, stroke, death, failure to reach weight loss goals and weight regain, hernia.  Discussed the typical peri-, and postoperative course.  Discussed the importance of lifelong behavioral changes to combat the chronic and relapsing disease which is obesity.  Questions were welcomed and answered to her satisfaction.    We will plan to proceed with surgery once scheduled.   BMI 30: 191lb    Patient Active Problem List   Diagnosis Date Noted   Primary osteoarthritis of left knee 04/20/2014   Obesity (BMI 30-39.9) 02/06/2014   Anemia 08/08/2013   Asthma 08/08/2013   Bilateral knee pain 08/08/2013   Right foot pain 08/08/2013   Essential hypertension  06/23/2013   S/P hysterectomy - Davinci  10/07/2012   Menorrhagia with fibroids 10/07/2012       Romana Juniper, MD [facility]  See AMION to contact appropriate on-call provider

## 2021-04-21 NOTE — H&P (Signed)
Surgical Evaluation  Chief Complaint: pre-op bariatric surgery       History of Present Illness: Returns for follow-up regarding surgical treatment of morbid obesity.  Denies any changes in her health since our initial visit and has continued to work diligently on her health.  Has completed the bariatric pathway with no barriers noted notified. CXR/UGI- small sliding HH Dietician- approved   Initial visit 11/18/20:Very pleasant 61 year old woman with history of hypertension, hyperlipidemia, osteoarthritis, prediabetes, chronic knee pain presents for consultation regarding surgical management of severe obesity.  She has tried multiple methods of weight loss in the past including ketogenic diet, Slim fast, a walking program, [employer].  She has not tried medications.  In general she has had limited success with previous efforts.  Obesity does run in her family in fact she has had several family members including her sister, nieces and several cousins who have had bariatric surgery (most of them sleeve) with great success. She is interested in surgical treatment for her obesity in order to improve the quality and quantity of her life, improve her blood pressure, avoid developing further complications with her prediabetes, and to reduce the pain from her knees (she had her left knee replaced 5 or 6 years ago and gained a substantial amount of weight after that)   Previous abdominal surgeries include robotic hysterectomy with lysis of adhesions in 2014, hernia repair in 1996, and incisional hernia repair in February of this year by Dr. Ninfa Linden (note describes a 2 cm fascial defect containing omental fat, placement of a 6.4 cm circular underlay mesh secured with Novofils and fascial closure with interrupted figure-of-eight #1 no refills.   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  She is hopeful to complete the process and undergo surgery around the week before Thanksgiving so that she will  not have to take too much time off of work.  Allergies  Allergen Reactions   Aspirin Other (See Comments)    Severe stomach pain   Codeine Nausea And Vomiting   Cyclobenzaprine Other (See Comments)    Gi upset   Ibuprofen Other (See Comments)    Severe stomach pain   Penicillins Hives   Latex Rash    Past Medical History:  Diagnosis Date   Hypertension    followed by pcp   Incisional hernia    OA (osteoarthritis)     Past Surgical History:  Procedure Laterality Date   BUNIONECTOMY Right 2020   great toe bunionectomy/  3rd toe pinning of fracture   CARPAL TUNNEL RELEASE Right [place] N/A 05/19/2020   Procedure: OPEN HERNIA REPAIR INCISIONAL;  Surgeon: Coralie Keens, MD;  Location: [facility];  Service: General;  Laterality: N/A;   LAPAROSCOPIC BILATERAL SALPINGECTOMY Bilateral 10/07/2012   Procedure: LAPAROSCOPIC BILATERAL SALPINGECTOMY;  Surgeon: Marvene Staff, MD;  Location: [facility];  Service: Gynecology;  Laterality: Bilateral;   ROBOTIC ASSISTED LAPAROSCOPIC LYSIS OF ADHESION N/A 10/07/2012   Procedure: ROBOTIC ASSISTED LAPAROSCOPIC LYSIS OF ADHESION;  Surgeon: Marvene Staff, MD;  Location: [facility];  Service: Gynecology;  Laterality: N/A;   ROBOTIC ASSISTED TOTAL HYSTERECTOMY N/A 10/07/2012   Procedure: ROBOTIC ASSISTED TOTAL HYSTERECTOMY;  Surgeon: Marvene Staff, MD;  Location: [facility];  Service: Gynecology;  Laterality: N/A;    No family history on file.  Social History   Socioeconomic History   Marital status: Married    Spouse name: Not on  file   Number of children: Not on file   Years of education: Not on file   Highest education level: Not on file  Occupational History   Not on file  Tobacco Use   Smoking status: Never   Smokeless tobacco: Never  Vaping Use   Vaping Use: Never used  Substance and Sexual Activity   Alcohol use: No   Drug use: Never   Sexual activity: Yes     Birth control/protection: Surgical  Other Topics Concern   Not on file  Social History Narrative   Not on file   Social Determinants of Health   Financial Resource Strain: Not on file  Food Insecurity: Not on file  Transportation Needs: Not on file  Physical Activity: Not on file  Stress: Not on file  Social Connections: Not on file    Current Outpatient Medications on File Prior to Visit  Medication Sig Dispense Refill   amLODipine (NORVASC) 10 MG tablet Take 10 mg by mouth daily.     lisinopril (PRINIVIL,ZESTRIL) 40 MG tablet Take 40 mg by mouth daily.  2   No current facility-administered medications on file prior to visit.    Review of Systems: a complete, 10pt review of systems was completed with pertinent positives and negatives as documented in the HPI  Physical Exam: BP: (!) 140/80  Pulse: (!) 112  Temp: 36.8 C (98.3 F)  SpO2: 93%  Weight: (!) 119.9 kg (264 lb 6.4 oz)  Height: 170.2 cm (5\' 7" )    Body mass index is 41.41 kg/m.   Gen: A&Ox3, no distress  Unlabored respiration   CBC Latest Ref Rng & Units 05/19/2020 10/08/2012 10/03/2012  WBC 4.0 - 10.5 K/uL - 15.3(H) 4.0  Hemoglobin 12.0 - 15.0 g/dL 12.9 9.9(L) 10.9(L)  Hematocrit 36.0 - 46.0 % 38.0 31.0(L) 34.0(L)  Platelets 150 - 400 K/uL - 209 216    CMP Latest Ref Rng & Units 05/19/2020 10/08/2012 10/03/2012  Glucose 70 - 99 mg/dL 109(H) 132(H) 99  BUN 6 - 20 mg/dL 13 12 11   Creatinine 0.44 - 1.00 mg/dL 1.00 0.98 0.89  Sodium 135 - 145 mmol/L 140 135 139  Potassium 3.5 - 5.1 mmol/L 4.2 4.4 4.1  Chloride 98 - 111 mmol/L 102 103 105  CO2 19 - 32 mEq/L - 24 25  Calcium 8.4 - 10.5 mg/dL - 9.4 9.3    No results found for: INR, PROTIME  Imaging: No results found.   A/P: Morbid obesity (CMS-HCC)     She remains a good candidate for sleeve gastrectomy, and that is what she is interested in. We again discussed the surgery including technical aspects, the risks of bleeding, infection, pain, scarring,  injury to intra-abdominal structures, staple line leak or abscess, chronic abdominal pain or nausea, new onset or worsened GERD, DVT/PE, pneumonia, heart attack, stroke, death, failure to reach weight loss goals and weight regain, hernia.  Discussed the typical peri-, and postoperative course.  Discussed the importance of lifelong behavioral changes to combat the chronic and relapsing disease which is obesity.  Questions were welcomed and answered to her satisfaction.    We will plan to proceed with surgery once scheduled.   BMI 30: 191lb    Patient Active Problem List   Diagnosis Date Noted   Primary osteoarthritis of left knee 04/20/2014   Obesity (BMI 30-39.9) 02/06/2014   Anemia 08/08/2013   Asthma 08/08/2013   Bilateral knee pain 08/08/2013   Right foot pain 08/08/2013   Essential hypertension  06/23/2013   S/P hysterectomy - Davinci  10/07/2012   Menorrhagia with fibroids 10/07/2012       Romana Juniper, MD [facility]  See AMION to contact appropriate on-call provider

## 2021-04-21 NOTE — Progress Notes (Signed)
Sent message, via epic in basket, requesting orders in epic from surgeon.  

## 2021-04-28 NOTE — Progress Notes (Addendum)
COVID swab appointment: 04/29/21 @ 1230 @ [place] due to transportation issues  COVID Vaccine Completed: yes x3 Date COVID Vaccine completed: Has received booster: COVID vaccine manufacturer: Pfizer      Date of COVID positive in last 90 days: no  PCP - Tereasa Coop, PA Cardiologist - n/a  Chest x-ray - 11/26/20 Epic EKG - 05/19/20 Epic Stress Test - n/a ECHO - n/a Cardiac Cath - n/a Pacemaker/ICD device last checked: n/a Spinal Cord Stimulator: n/a  Sleep Study - n/a CPAP -   Fasting Blood Sugar - pre Checks Blood Sugar _____ times a day  Blood Thinner Instructions: n/a Aspirin Instructions: Last Dose:  Activity level: Can go up a flight of stairs and perform activities of daily living without stopping and without symptoms of chest pain or shortness of breath.     Anesthesia review:   Patient denies shortness of breath, fever, cough and chest pain at PAT appointment   Patient verbalized understanding of instructions that were given to them at the PAT appointment. Patient was also instructed that they will need to review over the PAT instructions again at home before surgery.

## 2021-04-28 NOTE — Patient Instructions (Addendum)
DUE TO COVID-19 ONLY ONE VISITOR IS ALLOWED TO COME WITH YOU AND STAY IN THE WAITING ROOM ONLY DURING PRE OP AND PROCEDURE.   **NO VISITORS ARE ALLOWED IN THE SHORT STAY AREA OR RECOVERY ROOM!!**  IF YOU WILL BE ADMITTED INTO THE HOSPITAL YOU ARE ALLOWED ONLY TWO SUPPORT PEOPLE DURING VISITATION HOURS ONLY (7 AM -8PM)   The support person(s) must pass our screening, gel in and out, and wear a mask at all times, including in the patients room. Patients must also wear a mask when staff or their support person are in the room. Visitors GUEST BADGE MUST BE WORN VISIBLY  One adult visitor may remain with you overnight and MUST be in the room by 8 P.M.  No visitors under the age of 63. Any visitor under the age of 5 must be accompanied by an adult.    COVID SWAB TESTING MUST BE COMPLETED ON:  04/29/21 @12 :30 PM at [facility] are not required to quarantine, however you are required to wear a well-fitted mask when you are out and around people not in your household.  Hand Hygiene often Do NOT share personal items Notify your provider if you are in close contact with someone who has COVID or you develop fever 100.4 or greater, new onset of sneezing, cough, sore throat, shortness of breath or body aches.       Your procedure is scheduled on: 05/03/21   Report to [facility] Main Entrance    Report to admitting at 8:45 AM   Call this number if you have problems the morning of surgery [phone]   MORNING OF SURGERY DRINK:   DRINK 1 G2 drink BEFORE YOU LEAVE HOME, DRINK ALL OF THE  G2 DRINK AT ONE TIME.   NO SOLID FOOD AFTER 600 PM THE NIGHT BEFORE YOUR SURGERY. YOU MAY DRINK CLEAR FLUIDS. THE G2 DRINK YOU DRINK BEFORE YOU LEAVE HOME WILL BE THE LAST FLUIDS YOU DRINK BEFORE SURGERY.  PAIN IS EXPECTED AFTER SURGERY AND WILL NOT BE COMPLETELY ELIMINATED. AMBULATION AND TYLENOL WILL HELP REDUCE INCISIONAL AND GAS PAIN. MOVEMENT IS KEY!  YOU ARE EXPECTED TO BE OUT OF BED WITHIN 4 HOURS  OF ADMISSION TO YOUR PATIENT ROOM.  SITTING IN THE RECLINER THROUGHOUT THE DAY IS IMPORTANT FOR DRINKING FLUIDS AND MOVING GAS THROUGHOUT THE GI TRACT.  COMPRESSION STOCKINGS SHOULD BE WORN [place] UNLESS YOU ARE WALKING.   INCENTIVE SPIROMETER SHOULD BE USED EVERY HOUR WHILE AWAKE TO DECREASE POST-OPERATIVE COMPLICATIONS SUCH AS PNEUMONIA.  WHEN DISCHARGED HOME, IT IS IMPORTANT TO CONTINUE TO WALK EVERY HOUR AND USE THE INCENTIVE SPIROMETER EVERY HOUR.    May have liquids until 8:00 AM day of surgery  CLEAR LIQUID DIET  Foods Allowed                                                                     Foods Excluded  Water, Black Coffee and tea (no milk or creamer)           liquids that you cannot  Plain Jell-O in any flavor  (No red)  see through such as: Fruit ices (not with fruit pulp)                                            milk, soups, orange juice              Iced Popsicles (No red)                                                All solid food                                   Apple juices Sports drinks like Gatorade (No red) Lightly seasoned clear broth or consume(fat free) Sugar  Sample Menu Breakfast                                Lunch                                     Supper Cranberry juice                    Beef broth                            Chicken broth Jell-O                                     Grape juice                           Apple juice Coffee or tea                        Jell-O                                      Popsicle                                                Coffee or tea                        Coffee or tea      The day of surgery:  Drink ONE (1) Pre-Surgery G2 at 8:00 AM the morning of surgery. Drink in one sitting. Do not sip.  This drink was given to you during your hospital  pre-op appointment visit. Nothing else to drink after completing the  Pre-Surgery G2.          If  you have questions, please contact your surgeons office.     Oral Hygiene is also important to reduce your risk of infection.  Remember - BRUSH YOUR TEETH THE MORNING OF SURGERY WITH YOUR REGULAR TOOTHPASTE   Take these medicines the morning of surgery with A SIP OF WATER: Amlodipine                              You may not have any metal on your body including hair pins, jewelry, and body piercing             Do not wear make-up, lotions, powders, perfumes, or deodorant  Do not wear nail polish including gel and S&S, artificial/acrylic nails, or any other type of covering on natural nails including finger and toenails. If you have artificial nails, gel coating, etc. that needs to be removed by a nail salon please have this removed prior to surgery or surgery may need to be canceled/ delayed if the surgeon/ anesthesia feels like they are unable to be safely monitored.   Do not shave  48 hours prior to surgery.    Do not bring valuables to the hospital. [place].   Bring small overnight bag day of surgery.              Please read over the following fact sheets you were given: IF YOU HAVE QUESTIONS ABOUT YOUR PRE-OP INSTRUCTIONS PLEASE CALL [facility] - Preparing for Surgery Before surgery, you can play an important role.  Because skin is not sterile, your skin needs to be as free of germs as possible.  You can reduce the number of germs on your skin by washing with CHG (chlorahexidine gluconate) soap before surgery.  CHG is an antiseptic cleaner which kills germs and bonds with the skin to continue killing germs even after washing. Please DO NOT use if you have an allergy to CHG or antibacterial soaps.  If your skin becomes reddened/irritated stop using the CHG and inform your nurse when you arrive at Short Stay. Do not shave (including legs and underarms) for at least 48 hours  prior to the first CHG shower.  You may shave your face/neck.  Please follow these instructions carefully:  1.  Shower with CHG Soap the night before surgery and the  morning of surgery.  2.  If you choose to wash your hair, wash your hair first as usual with your normal  shampoo.  3.  After you shampoo, rinse your hair and body thoroughly to remove the shampoo.                             4.  Use CHG as you would any other liquid soap.  You can apply chg directly to the skin and wash.  Gently with a scrungie or clean washcloth.  5.  Apply the CHG Soap to your body ONLY FROM THE NECK DOWN.   Do   not use on face/ open                           Wound or open sores. Avoid contact with eyes, ears mouth and   genitals (private parts).                       Wash face,  Genitals (private parts) with your  normal soap.             6.  Wash thoroughly, paying special attention to the area where your    surgery  will be performed.  7.  Thoroughly rinse your body with warm water from the neck down.  8.  DO NOT shower/wash with your normal soap after using and rinsing off the CHG Soap.                9.  Pat yourself dry with a clean towel.            10.  Wear clean pajamas.            11.  Place clean sheets on your bed the night of your first shower and do not  sleep with pets. Day of Surgery : Do not apply any lotions/deodorants the morning of surgery.  Please wear clean clothes to the hospital/surgery center.  FAILURE TO FOLLOW THESE INSTRUCTIONS MAY RESULT IN THE CANCELLATION OF YOUR SURGERY  PATIENT SIGNATURE_________________________________  NURSE SIGNATURE__________________________________  ________________________________________________________________________   [name]  An incentive spirometer is a tool that can help keep your lungs clear and active. This tool measures how well you are filling your lungs with each breath. Taking long deep breaths may help reverse or decrease  the chance of developing breathing (pulmonary) problems (especially infection) following: A long period of time when you are unable to move or be active. BEFORE THE PROCEDURE  If the spirometer includes an indicator to show your best effort, your nurse or respiratory therapist will set it to a desired goal. If possible, sit up straight or lean slightly forward. Try not to slouch. Hold the incentive spirometer in an upright position. INSTRUCTIONS FOR USE  Sit on the edge of your bed if possible, or sit up as far as you can in bed or on a chair. Hold the incentive spirometer in an upright position. Breathe out normally. Place the mouthpiece in your mouth and seal your lips tightly around it. Breathe in slowly and as deeply as possible, raising the piston or the ball toward the top of the column. Hold your breath for 3-5 seconds or for as long as possible. Allow the piston or ball to fall to the bottom of the column. Remove the mouthpiece from your mouth and breathe out normally. Rest for a few seconds and repeat Steps 1 through 7 at least 10 times every 1-2 hours when you are awake. Take your time and take a few normal breaths between deep breaths. The spirometer may include an indicator to show your best effort. Use the indicator as a goal to work toward during each repetition. After each set of 10 deep breaths, practice coughing to be sure your lungs are clear. If you have an incision (the cut made at the time of surgery), support your incision when coughing by placing a pillow or rolled up towels firmly against it. Once you are able to get out of bed, walk around indoors and cough well. You may stop using the incentive spirometer when instructed by your caregiver.  RISKS AND COMPLICATIONS Take your time so you do not get dizzy or light-headed. If you are in pain, you may need to take or ask for pain medication before doing incentive spirometry. It is harder to take a deep breath if you are  having pain. AFTER USE Rest and breathe slowly and easily. It can be helpful to keep track of a log of your  progress. Your caregiver can provide you with a simple table to help with this. If you are using the spirometer at home, follow these instructions: [place] IF:  You are having difficultly using the spirometer. You have trouble using the spirometer as often as instructed. Your pain medication is not giving enough relief while using the spirometer. You develop fever of 100.5 F (38.1 C) or higher. SEEK IMMEDIATE MEDICAL CARE IF:  You cough up bloody sputum that had not been present before. You develop fever of 102 F (38.9 C) or greater. You develop worsening pain at or near the incision site. MAKE SURE YOU:  Understand these instructions. Will watch your condition. Will get help right away if you are not doing well or get worse. Document Released: 08/07/2006 Document Revised: 06/19/2011 Document Reviewed: 10/08/2006 ExitCare Patient Information 2014 [employer], [place].   ________________________________________________________________________    WHAT IS A BLOOD TRANSFUSION? Blood Transfusion Information  A transfusion is the replacement of blood or some of its parts. Blood is made up of multiple cells which provide different functions. Red blood cells carry oxygen and are used for blood loss replacement. White blood cells fight against infection. Platelets control bleeding. Plasma helps clot blood. Other blood products are available for specialized needs, such as hemophilia or other clotting disorders. BEFORE THE TRANSFUSION  Who gives blood for transfusions?  Healthy volunteers who are fully evaluated to make sure their blood is safe. This is blood bank blood. Transfusion therapy is the safest it has ever been in the practice of medicine. Before blood is taken from a donor, a complete history is taken to make sure that person has no history of diseases nor engages  in risky social behavior (examples are intravenous drug use or sexual activity with multiple partners). The donor's travel history is screened to minimize risk of transmitting infections, such as malaria. The donated blood is tested for signs of infectious diseases, such as HIV and hepatitis. The blood is then tested to be sure it is compatible with you in order to minimize the chance of a transfusion reaction. If you or a relative donates blood, this is often done in anticipation of surgery and is not appropriate for emergency situations. It takes many days to process the donated blood. RISKS AND COMPLICATIONS Although transfusion therapy is very safe and saves many lives, the main dangers of transfusion include:  Getting an infectious disease. Developing a transfusion reaction. This is an allergic reaction to something in the blood you were given. Every precaution is taken to prevent this. The decision to have a blood transfusion has been considered carefully by your caregiver before blood is given. Blood is not given unless the benefits outweigh the risks. AFTER THE TRANSFUSION Right after receiving a blood transfusion, you will usually feel much better and more energetic. This is especially true if your red blood cells have gotten low (anemic). The transfusion raises the level of the red blood cells which carry oxygen, and this usually causes an energy increase. The nurse administering the transfusion will monitor you carefully for complications. HOME CARE INSTRUCTIONS  No special instructions are needed after a transfusion. You may find your energy is better. Speak with your caregiver about any limitations on activity for underlying diseases you may have. SEEK MEDICAL CARE IF:  Your condition is not improving after your transfusion. You develop redness or irritation at the intravenous (IV) site. SEEK IMMEDIATE MEDICAL CARE IF:  Any of the following symptoms occur over the next  12 hours: Shaking  chills. You have a temperature by mouth above 102 F (38.9 C), not controlled by medicine. Chest, back, or muscle pain. People around you feel you are not acting correctly or are confused. Shortness of breath or difficulty breathing. Dizziness and fainting. You get a rash or develop hives. You have a decrease in urine output. Your urine turns a dark color or changes to pink, red, or brown. Any of the following symptoms occur over the next 10 days: You have a temperature by mouth above 102 F (38.9 C), not controlled by medicine. Shortness of breath. Weakness after normal activity. The white part of the eye turns yellow (jaundice). You have a decrease in the amount of urine or are urinating less often. Your urine turns a dark color or changes to pink, red, or brown. Document Released: 03/24/2000 Document Revised: 06/19/2011 Document Reviewed: 11/11/2007 [facility] Patient Information 2014 [place], [place].  _______________________________________________________________________

## 2021-04-29 ENCOUNTER — Encounter (HOSPITAL_COMMUNITY)
Admission: RE | Admit: 2021-04-29 | Discharge: 2021-04-29 | Disposition: A | Discharge status: Home / Self Care | Payer: BC Managed Care – PPO | Source: Ambulatory Visit | Attending: Surgery | Admitting: Surgery

## 2021-04-29 ENCOUNTER — Other Ambulatory Visit: Payer: Self-pay

## 2021-04-29 ENCOUNTER — Other Ambulatory Visit (HOSPITAL_COMMUNITY)
Admission: RE | Admit: 2021-04-29 | Discharge: 2021-04-29 | Disposition: A | Discharge status: Home / Self Care | Payer: BC Managed Care – PPO | Source: Ambulatory Visit | Attending: Surgery | Admitting: Surgery

## 2021-04-29 ENCOUNTER — Encounter (HOSPITAL_COMMUNITY): Payer: Self-pay

## 2021-04-29 DIAGNOSIS — Z20822 Contact with and (suspected) exposure to covid-19: Secondary | ICD-10-CM | POA: Diagnosis not present

## 2021-04-29 DIAGNOSIS — Z01818 Encounter for other preprocedural examination: Secondary | ICD-10-CM

## 2021-04-29 DIAGNOSIS — Z01812 Encounter for preprocedural laboratory examination: Secondary | ICD-10-CM | POA: Diagnosis present

## 2021-04-29 HISTORY — DX: Prediabetes: R73.03

## 2021-04-29 LAB — GLUCOSE, CAPILLARY: Glucose-Capillary: 162 mg/dL — ABNORMAL HIGH (ref 70–99)

## 2021-04-29 LAB — COMPREHENSIVE METABOLIC PANEL
ALT: 13 U/L (ref 0–44)
AST: 17 U/L (ref 15–41)
Albumin: 3.8 g/dL (ref 3.5–5.0)
Alkaline Phosphatase: 57 U/L (ref 38–126)
Anion gap: 6 (ref 5–15)
BUN: 17 mg/dL (ref 6–20)
CO2: 26 mmol/L (ref 22–32)
Calcium: 9.2 mg/dL (ref 8.9–10.3)
Chloride: 106 mmol/L (ref 98–111)
Creatinine, Ser: 0.99 mg/dL (ref 0.44–1.00)
GFR, Estimated: >60 mL/min (ref >60)
Glucose, Bld: 103 mg/dL — ABNORMAL HIGH (ref 70–99)
Potassium: 4.3 mmol/L (ref 3.5–5.1)
Sodium: 138 mmol/L (ref 135–145)
Total Bilirubin: 0.4 mg/dL (ref 0.3–1.2)
Total Protein: 7.3 g/dL (ref 6.5–8.1)

## 2021-04-29 LAB — CBC WITH DIFFERENTIAL/PLATELET
Abs Immature Granulocytes: 0.03 10*3/uL (ref 0.00–0.07)
Basophils Absolute: 0 10*3/uL (ref 0.0–0.1)
Basophils Relative: 1 %
Eosinophils Absolute: 0.1 10*3/uL (ref 0.0–0.5)
Eosinophils Relative: 1 %
HCT: 36.4 % (ref 36.0–46.0)
Hemoglobin: 10.8 g/dL — ABNORMAL LOW (ref 12.0–15.0)
Immature Granulocytes: 1 %
Lymphocytes Relative: 38 %
Lymphs Abs: 2.3 10*3/uL (ref 0.7–4.0)
MCH: 26.7 pg (ref 26.0–34.0)
MCHC: 29.7 g/dL — ABNORMAL LOW (ref 30.0–36.0)
MCV: 90.1 fL (ref 80.0–100.0)
Monocytes Absolute: 0.5 10*3/uL (ref 0.1–1.0)
Monocytes Relative: 8 %
Neutro Abs: 3 10*3/uL (ref 1.7–7.7)
Neutrophils Relative %: 51 %
Platelets: 244 10*3/uL (ref 150–400)
RBC: 4.04 MIL/uL (ref 3.87–5.11)
RDW: 12.9 % (ref 11.5–15.5)
WBC: 5.9 10*3/uL (ref 4.0–10.5)
nRBC: 0 % (ref 0.0–0.2)

## 2021-04-29 LAB — SARS CORONAVIRUS 2 (TAT 6-24 HRS): SARS Coronavirus 2: NEGATIVE

## 2021-04-30 ENCOUNTER — Telehealth (HOSPITAL_COMMUNITY): Payer: Self-pay | Admitting: *Deleted

## 2021-04-30 NOTE — Telephone Encounter (Signed)
Reviewed Gastric Sleeve discharge instructions with via phone.  Provided information on signs and symptoms to report, Phase 2 post op liquid diet, BELT program, Support Group and [facility] outpatient pharmacy. Patient can articulate understanding. All questions answered.  Patient provided with surgeon, dietician, and [facility] contact information for any comments, questions, or concerns via email and will receive discharge folder during hospitalization. Will f/u with patient within one week post operatively. 

## 2021-05-03 ENCOUNTER — Inpatient Hospital Stay (HOSPITAL_COMMUNITY)
Admission: RE | Admit: 2021-05-03 | Discharge: 2021-05-04 | DRG: 621 | Disposition: A | Discharge status: Home / Self Care | Payer: BC Managed Care – PPO | Source: Ambulatory Visit | Attending: Surgery | Admitting: Surgery

## 2021-05-03 ENCOUNTER — Encounter (HOSPITAL_COMMUNITY)
Admission: RE | Disposition: A | Discharge status: Home / Self Care | Payer: Self-pay | Source: Ambulatory Visit | Attending: Surgery

## 2021-05-03 ENCOUNTER — Inpatient Hospital Stay (HOSPITAL_COMMUNITY): Payer: BC Managed Care – PPO | Admitting: Certified Registered Nurse Anesthetist

## 2021-05-03 ENCOUNTER — Other Ambulatory Visit: Payer: Self-pay

## 2021-05-03 ENCOUNTER — Encounter (HOSPITAL_COMMUNITY): Payer: Self-pay | Admitting: Surgery

## 2021-05-03 DIAGNOSIS — I10 Essential (primary) hypertension: Secondary | ICD-10-CM | POA: Diagnosis present

## 2021-05-03 DIAGNOSIS — Z6841 Body Mass Index (BMI) 40.0 and over, adult: Secondary | ICD-10-CM

## 2021-05-03 DIAGNOSIS — Z20822 Contact with and (suspected) exposure to covid-19: Secondary | ICD-10-CM | POA: Diagnosis present

## 2021-05-03 DIAGNOSIS — Z90710 Acquired absence of both cervix and uterus: Secondary | ICD-10-CM

## 2021-05-03 DIAGNOSIS — K449 Diaphragmatic hernia without obstruction or gangrene: Secondary | ICD-10-CM | POA: Diagnosis present

## 2021-05-03 DIAGNOSIS — R7303 Prediabetes: Secondary | ICD-10-CM | POA: Diagnosis present

## 2021-05-03 DIAGNOSIS — E785 Hyperlipidemia, unspecified: Secondary | ICD-10-CM | POA: Diagnosis present

## 2021-05-03 HISTORY — PX: HIATAL HERNIA REPAIR: SHX195

## 2021-05-03 HISTORY — PX: LAPAROSCOPIC GASTRIC SLEEVE RESECTION: SHX5895

## 2021-05-03 HISTORY — PX: UPPER GI ENDOSCOPY: SHX6162

## 2021-05-03 LAB — TYPE AND SCREEN
ABO/RH(D): O POS
Antibody Screen: NEGATIVE

## 2021-05-03 LAB — GLUCOSE, CAPILLARY: Glucose-Capillary: 111 mg/dL — ABNORMAL HIGH (ref 70–99)

## 2021-05-03 LAB — ABO/RH: ABO/RH(D): O POS

## 2021-05-03 SURGERY — GASTRECTOMY, SLEEVE, LAPAROSCOPIC
Anesthesia: General | Site: Esophagus

## 2021-05-03 MED ORDER — STERILE WATER FOR IRRIGATION IR SOLN
Status: DC | PRN
  Administered 2021-05-03: 500 mL
  Administered 2021-05-03: 1000 mL

## 2021-05-03 MED ORDER — SUGAMMADEX SODIUM 500 MG/5ML IV SOLN
INTRAVENOUS | Status: AC
  Filled 2021-05-03: qty 5

## 2021-05-03 MED ORDER — MEPERIDINE HCL 50 MG/ML IJ SOLN: 6.2500 mg | INTRAMUSCULAR | Status: DC | PRN

## 2021-05-03 MED ORDER — ENSURE MAX PROTEIN PO LIQD
2.0000 [oz_av] | ORAL | Status: DC
  Administered 2021-05-04 (×3): 2 [oz_av] via ORAL

## 2021-05-03 MED ORDER — ACETAMINOPHEN 500 MG PO TABS
1000.0000 mg | ORAL_TABLET | ORAL | Status: AC
  Administered 2021-05-03: 10:00:00 1000 mg via ORAL
  Filled 2021-05-03: qty 2

## 2021-05-03 MED ORDER — ONDANSETRON HCL 4 MG/2ML IJ SOLN
INTRAMUSCULAR | Status: AC
  Filled 2021-05-03: qty 2

## 2021-05-03 MED ORDER — EPHEDRINE 5 MG/ML INJ
INTRAVENOUS | Status: AC
  Filled 2021-05-03: qty 5

## 2021-05-03 MED ORDER — BUPIVACAINE LIPOSOME 1.3 % IJ SUSP
INTRAMUSCULAR | Status: AC
  Filled 2021-05-03: qty 20

## 2021-05-03 MED ORDER — GABAPENTIN 300 MG PO CAPS
300.0000 mg | ORAL_CAPSULE | ORAL | Status: AC
  Administered 2021-05-03: 10:00:00 300 mg via ORAL
  Filled 2021-05-03: qty 1

## 2021-05-03 MED ORDER — LACTATED RINGERS IR SOLN
Status: DC | PRN
  Administered 2021-05-03: 3000 mL

## 2021-05-03 MED ORDER — PHENYLEPHRINE 40 MCG/ML (10ML) SYRINGE FOR IV PUSH (FOR BLOOD PRESSURE SUPPORT)
PREFILLED_SYRINGE | INTRAVENOUS | Status: AC
  Filled 2021-05-03: qty 20

## 2021-05-03 MED ORDER — OXYCODONE HCL 5 MG PO TABS: 5.0000 mg | ORAL_TABLET | Freq: Once | ORAL | Status: DC | PRN

## 2021-05-03 MED ORDER — ACETAMINOPHEN 160 MG/5ML PO SOLN: 1000.0000 mg | Freq: Three times a day (TID) | ORAL | Status: DC

## 2021-05-03 MED ORDER — METHOCARBAMOL 1000 MG/10ML IJ SOLN
500.0000 mg | Freq: Four times a day (QID) | INTRAVENOUS | Status: DC | PRN
  Filled 2021-05-03: qty 5

## 2021-05-03 MED ORDER — METOCLOPRAMIDE HCL 5 MG/ML IJ SOLN
10.0000 mg | Freq: Four times a day (QID) | INTRAMUSCULAR | Status: DC
  Administered 2021-05-04 (×2): 10 mg via INTRAVENOUS
  Filled 2021-05-03 (×3): qty 2

## 2021-05-03 MED ORDER — FENTANYL CITRATE (PF) 100 MCG/2ML IJ SOLN
INTRAMUSCULAR | Status: AC
  Filled 2021-05-03: qty 2

## 2021-05-03 MED ORDER — PHENYLEPHRINE 40 MCG/ML (10ML) SYRINGE FOR IV PUSH (FOR BLOOD PRESSURE SUPPORT)
PREFILLED_SYRINGE | INTRAVENOUS | Status: AC
  Filled 2021-05-03: qty 10

## 2021-05-03 MED ORDER — SIMETHICONE 80 MG PO CHEW: 80.0000 mg | CHEWABLE_TABLET | Freq: Four times a day (QID) | ORAL | Status: DC | PRN

## 2021-05-03 MED ORDER — SODIUM CHLORIDE 0.9 % IV SOLN: INTRAVENOUS | Status: DC

## 2021-05-03 MED ORDER — DEXAMETHASONE SODIUM PHOSPHATE 10 MG/ML IJ SOLN
INTRAMUSCULAR | Status: AC
  Filled 2021-05-03: qty 1

## 2021-05-03 MED ORDER — DEXAMETHASONE SODIUM PHOSPHATE 10 MG/ML IJ SOLN
INTRAMUSCULAR | Status: DC | PRN
  Administered 2021-05-03: 6 mg via INTRAVENOUS

## 2021-05-03 MED ORDER — 0.9 % SODIUM CHLORIDE (POUR BTL) OPTIME
TOPICAL | Status: DC | PRN
  Administered 2021-05-03: 12:00:00 1000 mL

## 2021-05-03 MED ORDER — ONDANSETRON HCL 4 MG/2ML IJ SOLN
4.0000 mg | INTRAMUSCULAR | Status: DC | PRN
  Administered 2021-05-03: 18:00:00 4 mg via INTRAVENOUS
  Filled 2021-05-03: qty 2

## 2021-05-03 MED ORDER — PROPOFOL 10 MG/ML IV BOLUS
INTRAVENOUS | Status: DC | PRN
  Administered 2021-05-03: 200 mg via INTRAVENOUS

## 2021-05-03 MED ORDER — ACETAMINOPHEN 500 MG PO TABS
1000.0000 mg | ORAL_TABLET | Freq: Three times a day (TID) | ORAL | Status: DC
  Administered 2021-05-03 – 2021-05-04 (×2): 1000 mg via ORAL
  Filled 2021-05-03 (×3): qty 2

## 2021-05-03 MED ORDER — FENTANYL CITRATE (PF) 100 MCG/2ML IJ SOLN
INTRAMUSCULAR | Status: DC | PRN
  Administered 2021-05-03 (×4): 50 ug via INTRAVENOUS

## 2021-05-03 MED ORDER — LACTATED RINGERS IV SOLN: INTRAVENOUS | Status: DC

## 2021-05-03 MED ORDER — BUPIVACAINE LIPOSOME 1.3 % IJ SUSP: 20.0000 mL | Freq: Once | INTRAMUSCULAR | Status: DC

## 2021-05-03 MED ORDER — ENOXAPARIN SODIUM 30 MG/0.3ML IJ SOSY
30.0000 mg | PREFILLED_SYRINGE | Freq: Two times a day (BID) | INTRAMUSCULAR | Status: DC
  Administered 2021-05-03 – 2021-05-04 (×2): 30 mg via SUBCUTANEOUS
  Filled 2021-05-03 (×2): qty 0.3

## 2021-05-03 MED ORDER — BUPIVACAINE-EPINEPHRINE 0.25% -1:200000 IJ SOLN
INTRAMUSCULAR | Status: DC | PRN
  Administered 2021-05-03: 30 mL

## 2021-05-03 MED ORDER — CHLORHEXIDINE GLUCONATE 4 % EX LIQD: 60.0000 mL | Freq: Once | CUTANEOUS | Status: DC

## 2021-05-03 MED ORDER — ORAL CARE MOUTH RINSE: 15.0000 mL | Freq: Once | OROMUCOSAL | Status: AC

## 2021-05-03 MED ORDER — TRAMADOL HCL 50 MG PO TABS
50.0000 mg | ORAL_TABLET | Freq: Four times a day (QID) | ORAL | Status: DC | PRN
  Administered 2021-05-03: 22:00:00 50 mg via ORAL
  Filled 2021-05-03: qty 1

## 2021-05-03 MED ORDER — CHLORHEXIDINE GLUCONATE 0.12 % MT SOLN
15.0000 mL | Freq: Once | OROMUCOSAL | Status: AC
  Administered 2021-05-03: 10:00:00 15 mL via OROMUCOSAL

## 2021-05-03 MED ORDER — KETAMINE HCL-SODIUM CHLORIDE 100-0.9 MG/10ML-% IV SOSY
PREFILLED_SYRINGE | INTRAVENOUS | Status: DC | PRN
  Administered 2021-05-03: 30 mg via INTRAVENOUS
  Administered 2021-05-03: 10 mg via INTRAVENOUS

## 2021-05-03 MED ORDER — LIDOCAINE HCL (PF) 2 % IJ SOLN
INTRAMUSCULAR | Status: DC | PRN
  Administered 2021-05-03: 1.5 mg/kg/h via INTRADERMAL

## 2021-05-03 MED ORDER — HYDRALAZINE HCL 20 MG/ML IJ SOLN
10.0000 mg | INTRAMUSCULAR | Status: DC | PRN
  Administered 2021-05-03: 22:00:00 10 mg via INTRAVENOUS
  Filled 2021-05-03: qty 1

## 2021-05-03 MED ORDER — MIDAZOLAM HCL 5 MG/5ML IJ SOLN
INTRAMUSCULAR | Status: DC | PRN
  Administered 2021-05-03: 2 mg via INTRAVENOUS

## 2021-05-03 MED ORDER — METOPROLOL TARTRATE 5 MG/5ML IV SOLN: 5.0000 mg | Freq: Four times a day (QID) | INTRAVENOUS | Status: DC | PRN

## 2021-05-03 MED ORDER — LIDOCAINE 2% (20 MG/ML) 5 ML SYRINGE
INTRAMUSCULAR | Status: DC | PRN
  Administered 2021-05-03: 40 mg via INTRAVENOUS

## 2021-05-03 MED ORDER — DOCUSATE SODIUM 100 MG PO CAPS
100.0000 mg | ORAL_CAPSULE | Freq: Two times a day (BID) | ORAL | Status: DC
  Administered 2021-05-03 – 2021-05-04 (×2): 100 mg via ORAL
  Filled 2021-05-03 (×2): qty 1

## 2021-05-03 MED ORDER — PHENYLEPHRINE 40 MCG/ML (10ML) SYRINGE FOR IV PUSH (FOR BLOOD PRESSURE SUPPORT)
PREFILLED_SYRINGE | INTRAVENOUS | Status: DC | PRN
  Administered 2021-05-03: 120 ug via INTRAVENOUS
  Administered 2021-05-03: 80 ug via INTRAVENOUS

## 2021-05-03 MED ORDER — PHENYLEPHRINE HCL (PRESSORS) 10 MG/ML IV SOLN
INTRAVENOUS | Status: AC
  Filled 2021-05-03: qty 1

## 2021-05-03 MED ORDER — AMLODIPINE BESYLATE 10 MG PO TABS
10.0000 mg | ORAL_TABLET | Freq: Every day | ORAL | Status: DC
  Administered 2021-05-04: 09:00:00 10 mg via ORAL
  Filled 2021-05-03: qty 1

## 2021-05-03 MED ORDER — LIDOCAINE HCL (PF) 2 % IJ SOLN
INTRAMUSCULAR | Status: AC
  Filled 2021-05-03: qty 5

## 2021-05-03 MED ORDER — HYDROMORPHONE HCL 1 MG/ML IJ SOLN
INTRAMUSCULAR | Status: AC
  Administered 2021-05-03: 14:00:00 0.5 mg via INTRAVENOUS
  Filled 2021-05-03: qty 2

## 2021-05-03 MED ORDER — HEPARIN SODIUM (PORCINE) 5000 UNIT/ML IJ SOLN
5000.0000 [IU] | INTRAMUSCULAR | Status: AC
  Administered 2021-05-03: 10:00:00 5000 [IU] via SUBCUTANEOUS
  Filled 2021-05-03: qty 1

## 2021-05-03 MED ORDER — SCOPOLAMINE 1 MG/3DAYS TD PT72
1.0000 | MEDICATED_PATCH | TRANSDERMAL | Status: DC
  Administered 2021-05-03: 10:00:00 1.5 mg via TRANSDERMAL
  Filled 2021-05-03: qty 1

## 2021-05-03 MED ORDER — OXYCODONE HCL 5 MG/5ML PO SOLN: 5.0000 mg | Freq: Once | ORAL | Status: DC | PRN

## 2021-05-03 MED ORDER — PROPOFOL 10 MG/ML IV BOLUS
INTRAVENOUS | Status: AC
  Filled 2021-05-03: qty 20

## 2021-05-03 MED ORDER — PANTOPRAZOLE SODIUM 40 MG IV SOLR
40.0000 mg | Freq: Every day | INTRAVENOUS | Status: DC
  Administered 2021-05-03: 21:00:00 40 mg via INTRAVENOUS
  Filled 2021-05-03: qty 40

## 2021-05-03 MED ORDER — HYDROMORPHONE HCL 1 MG/ML IJ SOLN
0.2500 mg | INTRAMUSCULAR | Status: DC | PRN
  Administered 2021-05-03: 14:00:00 0.5 mg via INTRAVENOUS

## 2021-05-03 MED ORDER — APREPITANT 40 MG PO CAPS
40.0000 mg | ORAL_CAPSULE | ORAL | Status: AC
  Administered 2021-05-03: 10:00:00 40 mg via ORAL
  Filled 2021-05-03: qty 1

## 2021-05-03 MED ORDER — HYDROMORPHONE HCL 1 MG/ML IJ SOLN: 0.5000 mg | INTRAMUSCULAR | Status: DC | PRN

## 2021-05-03 MED ORDER — EPHEDRINE SULFATE-NACL 50-0.9 MG/10ML-% IV SOSY
PREFILLED_SYRINGE | INTRAVENOUS | Status: DC | PRN
  Administered 2021-05-03: 10 mg via INTRAVENOUS

## 2021-05-03 MED ORDER — MIDAZOLAM HCL 2 MG/2ML IJ SOLN
INTRAMUSCULAR | Status: AC
  Filled 2021-05-03: qty 2

## 2021-05-03 MED ORDER — ROCURONIUM BROMIDE 10 MG/ML (PF) SYRINGE
PREFILLED_SYRINGE | INTRAVENOUS | Status: DC | PRN
  Administered 2021-05-03: 20 mg via INTRAVENOUS
  Administered 2021-05-03: 60 mg via INTRAVENOUS

## 2021-05-03 MED ORDER — MIDAZOLAM HCL 2 MG/2ML IJ SOLN: 0.5000 mg | Freq: Once | INTRAMUSCULAR | Status: DC | PRN

## 2021-05-03 MED ORDER — SODIUM CHLORIDE 0.9 % IV SOLN
2.0000 g | INTRAVENOUS | Status: AC
  Administered 2021-05-03: 12:00:00 2 g via INTRAVENOUS
  Filled 2021-05-03: qty 2

## 2021-05-03 MED ORDER — ACETAMINOPHEN 500 MG PO TABS: 1000.0000 mg | ORAL_TABLET | Freq: Once | ORAL | Status: DC

## 2021-05-03 MED ORDER — GABAPENTIN 100 MG PO CAPS
200.0000 mg | ORAL_CAPSULE | Freq: Two times a day (BID) | ORAL | Status: DC
  Administered 2021-05-03 – 2021-05-04 (×2): 200 mg via ORAL
  Filled 2021-05-03 (×3): qty 2

## 2021-05-03 MED ORDER — PROMETHAZINE HCL 25 MG/ML IJ SOLN
6.2500 mg | INTRAMUSCULAR | Status: DC | PRN
  Administered 2021-05-03: 14:00:00 6.25 mg via INTRAVENOUS

## 2021-05-03 MED ORDER — PROMETHAZINE HCL 25 MG/ML IJ SOLN
INTRAMUSCULAR | Status: AC
  Filled 2021-05-03: qty 1

## 2021-05-03 MED ORDER — ROCURONIUM BROMIDE 10 MG/ML (PF) SYRINGE
PREFILLED_SYRINGE | INTRAVENOUS | Status: AC
  Filled 2021-05-03: qty 10

## 2021-05-03 MED ORDER — BUPIVACAINE-EPINEPHRINE (PF) 0.25% -1:200000 IJ SOLN
INTRAMUSCULAR | Status: AC
  Filled 2021-05-03: qty 30

## 2021-05-03 MED ORDER — PHENYLEPHRINE HCL-NACL 20-0.9 MG/250ML-% IV SOLN
INTRAVENOUS | Status: DC | PRN
  Administered 2021-05-03: 40 ug/min via INTRAVENOUS

## 2021-05-03 MED ORDER — SUGAMMADEX SODIUM 500 MG/5ML IV SOLN
INTRAVENOUS | Status: DC | PRN
  Administered 2021-05-03: 500 mg via INTRAVENOUS

## 2021-05-03 MED ORDER — ONDANSETRON HCL 4 MG/2ML IJ SOLN
INTRAMUSCULAR | Status: DC | PRN
  Administered 2021-05-03: 4 mg via INTRAVENOUS

## 2021-05-03 MED ORDER — BUPIVACAINE LIPOSOME 1.3 % IJ SUSP
INTRAMUSCULAR | Status: DC | PRN
  Administered 2021-05-03: 20 mL

## 2021-05-03 MED ORDER — OXYCODONE HCL 5 MG/5ML PO SOLN: 5.0000 mg | Freq: Four times a day (QID) | ORAL | Status: DC | PRN

## 2021-05-03 SURGICAL SUPPLY — 75 items
APPLIER CLIP ROT 10 11.4 M/L (STAPLE) ×4
APPLIER CLIP ROT 13.4 12 LRG (CLIP)
BAG COUNTER SPONGE SURGICOUNT (BAG) ×1 IMPLANT
BAG LAPAROSCOPIC 12 15 PORT 16 (BASKET) IMPLANT
BAG RETRIEVAL 12/15 (BASKET)
BAG RETRIEVAL 12/15MM (BASKET)
BAG SURGICOUNT SPONGE COUNTING (BAG) ×1
BENZOIN TINCTURE PRP APPL 2/3 (GAUZE/BANDAGES/DRESSINGS) ×4 IMPLANT
BLADE SURG SZ11 CARB STEEL (BLADE) ×4 IMPLANT
BNDG ADH 1X3 SHEER STRL LF (GAUZE/BANDAGES/DRESSINGS) ×24 IMPLANT
CABLE HIGH FREQUENCY MONO STRZ (ELECTRODE) ×4 IMPLANT
CHLORAPREP W/TINT 26 (MISCELLANEOUS) ×8 IMPLANT
CLIP APPLIE ROT 10 11.4 M/L (STAPLE) IMPLANT
CLIP APPLIE ROT 13.4 12 LRG (CLIP) IMPLANT
CLOSURE WOUND 1/2 X4 (GAUZE/BANDAGES/DRESSINGS) ×1
COVER SURGICAL LIGHT HANDLE (MISCELLANEOUS) ×4 IMPLANT
DECANTER SPIKE VIAL GLASS SM (MISCELLANEOUS) ×4 IMPLANT
DEVICE SUT QUICK LOAD TK 5 (STAPLE) ×2 IMPLANT
DEVICE SUT TI-KNOT TK 5X26 (MISCELLANEOUS) ×1 IMPLANT
DEVICE TI KNOT TK5 (MISCELLANEOUS) ×1
DRAPE UTILITY XL STRL (DRAPES) ×8 IMPLANT
ELECT REM PT RETURN 15FT ADLT (MISCELLANEOUS) ×4 IMPLANT
GAUZE SPONGE 4X4 12PLY STRL (GAUZE/BANDAGES/DRESSINGS) IMPLANT
GLOVE SURG ENC MOIS LTX SZ6 (GLOVE) ×4 IMPLANT
GLOVE SURG MICRO LTX SZ6 (GLOVE) ×4 IMPLANT
GLOVE SURG UNDER LTX SZ6.5 (GLOVE) ×4 IMPLANT
GOWN STRL REUS W/TWL LRG LVL3 (GOWN DISPOSABLE) ×4 IMPLANT
GOWN STRL REUS W/TWL XL LVL3 (GOWN DISPOSABLE) ×8 IMPLANT
GRASPER SUT TROCAR 14GX15 (MISCELLANEOUS) ×4 IMPLANT
IRRIG SUCT STRYKERFLOW 2 WTIP (MISCELLANEOUS) ×4
IRRIGATION SUCT STRKRFLW 2 WTP (MISCELLANEOUS) ×2 IMPLANT
KIT BASIN OR (CUSTOM PROCEDURE TRAY) ×4 IMPLANT
KIT TURNOVER KIT A (KITS) IMPLANT
MARKER SKIN DUAL TIP RULER LAB (MISCELLANEOUS) ×4 IMPLANT
MAT PREVALON FULL STRYKER (MISCELLANEOUS) ×4 IMPLANT
NDL SPNL 22GX3.5 QUINCKE BK (NEEDLE) ×2 IMPLANT
NEEDLE SPNL 22GX3.5 QUINCKE BK (NEEDLE) ×4 IMPLANT
PACK UNIVERSAL I (CUSTOM PROCEDURE TRAY) ×4 IMPLANT
QUICK LOAD TK 5 (STAPLE) ×2
RELOAD ENDO STITCH (ENDOMECHANICALS) ×8 IMPLANT
RELOAD STAPLE 60 3.6 BLU REG (STAPLE) ×2 IMPLANT
RELOAD STAPLE 60 3.8 GOLD REG (STAPLE) ×2 IMPLANT
RELOAD STAPLE 60 4.1 GRN THCK (STAPLE) IMPLANT
RELOAD STAPLER BLUE 60MM (STAPLE) ×4 IMPLANT
RELOAD STAPLER GOLD 60MM (STAPLE) ×2 IMPLANT
RELOAD STAPLER GREEN 60MM (STAPLE) IMPLANT
RELOAD SUT TRIPLE-STITCH 2-0 (ENDOMECHANICALS) IMPLANT
SCISSORS LAP 5X45 EPIX DISP (ENDOMECHANICALS) ×4 IMPLANT
SET TUBE SMOKE EVAC HIGH FLOW (TUBING) ×4 IMPLANT
SHEARS HARMONIC ACE PLUS 45CM (MISCELLANEOUS) ×4 IMPLANT
SLEEVE ADV FIXATION 5X100MM (TROCAR) ×8 IMPLANT
SLEEVE GASTRECTOMY 40FR VISIGI (MISCELLANEOUS) ×4 IMPLANT
SOL ANTI FOG 6CC (MISCELLANEOUS) ×2 IMPLANT
SOLUTION ANTI FOG 6CC (MISCELLANEOUS) ×2
SPONGE T-LAP 18X18 ~~LOC~~+RFID (SPONGE) ×4 IMPLANT
STAPLE LINE REINFORCEMENT LAP (STAPLE) ×12 IMPLANT
STAPLER ECHELON LONG 60 440 (INSTRUMENTS) ×4 IMPLANT
STAPLER RELOAD BLUE 60MM (STAPLE) ×8
STAPLER RELOAD GOLD 60MM (STAPLE) ×4
STAPLER RELOAD GREEN 60MM (STAPLE)
STRIP CLOSURE SKIN 1/2X4 (GAUZE/BANDAGES/DRESSINGS) ×3 IMPLANT
SUT MNCRL AB 4-0 PS2 18 (SUTURE) ×4 IMPLANT
SUT SURGIDAC NAB ES-9 0 48 120 (SUTURE) ×2 IMPLANT
SUT VICRYL 0 TIES 12 18 (SUTURE) ×4 IMPLANT
SYR 10ML ECCENTRIC (SYRINGE) ×4 IMPLANT
SYR 20ML LL LF (SYRINGE) ×4 IMPLANT
SYR 50ML LL SCALE MARK (SYRINGE) ×4 IMPLANT
TOWEL OR 17X26 10 PK STRL BLUE (TOWEL DISPOSABLE) ×4 IMPLANT
TOWEL OR NON WOVEN STRL DISP B (DISPOSABLE) ×4 IMPLANT
TROCAR ADV FIXATION 5X100MM (TROCAR) ×4 IMPLANT
TROCAR BLADELESS 15MM (ENDOMECHANICALS) ×4 IMPLANT
TROCAR BLADELESS OPT 5 100 (ENDOMECHANICALS) ×4 IMPLANT
TUBING CONNECTING 10 (TUBING) ×3 IMPLANT
TUBING CONNECTING 10' (TUBING) ×1
TUBING ENDO SMARTCAP (MISCELLANEOUS) ×4 IMPLANT

## 2021-05-03 NOTE — Discharge Instructions (Signed)

## 2021-05-03 NOTE — Interval H&P Note (Signed)
History and Physical Interval Note:  05/03/2021 10:15 AM  [name]  has presented today for surgery, with the diagnosis of MORBID OBESITY.  The various methods of treatment have been discussed with the patient and family. After consideration of risks, benefits and other options for treatment, the patient has consented to  Procedure(s): LAPAROSCOPIC GASTRIC SLEEVE RESECTION (N/A) UPPER GI ENDOSCOPY (N/A) HERNIA REPAIR HIATAL (N/A) as a surgical intervention.  The patient's history has been reviewed, patient examined, no change in status, stable for surgery.  I have reviewed the patient's chart and labs.  Questions were answered to the patient's satisfaction.     Courtney Fenlon Rich Brave

## 2021-05-03 NOTE — Op Note (Signed)
A[place] Hafen [id number] 1960-08-01 05/03/2021  Preoperative diagnosis: severe obesity  Postoperative diagnosis: Same   Procedure: upper endoscopy   Surgeon: Leighton Ruff. Quamir Willemsen M.D., FACS   Anesthesia: Gen.   Indications for procedure: 61 y.o. year old female undergoing Laparoscopic Gastric Sleeve Resection and an EGD was requested to evaluate the new gastric sleeve.   Description of procedure: After we have completed the sleeve resection, I scrubbed out and obtained the Olympus endoscope. I gently placed endoscope in the patient's oropharynx and gently glided it down the esophagus without any difficulty under direct visualization. Once I was in the gastric sleeve, I insufflated the stomach with air. I was able to cannulate and advanced the scope through the gastric sleeve. I was able to cannulate the duodenum with ease. Dr. Kae Heller had placed saline in the upper abdomen. Upon further insufflation of the gastric sleeve there was no evidence of bubbles. GE junction located at 38 cm.  Upon further inspection of the gastric sleeve, the mucosa appeared normal. There is no evidence of any mucosal abnormality. The sleeve was widely patent at the angularis. There was no evidence of bleeding. The gastric sleeve was decompressed. The scope was withdrawn. The patient tolerated this portion of the procedure well. Please see Dr Ron Parker operative note for details regarding the laparoscopic gastric sleeve resection.   Leighton Ruff. Redmond Pulling, MD, FACS  General, Bariatric, & Minimally Invasive Surgery  [facility] Surgery, [place]

## 2021-05-03 NOTE — Anesthesia Procedure Notes (Signed)
Procedure Name: Intubation Date/Time: 05/03/2021 11:35 AM Performed by: West Pugh, CRNA Pre-anesthesia Checklist: Patient identified, Emergency Drugs available, Suction available, Patient being monitored and Timeout performed Patient Re-evaluated:Patient Re-evaluated prior to induction Oxygen Delivery Method: Circle system utilized Preoxygenation: Pre-oxygenation with 100% oxygen Induction Type: IV induction Ventilation: Mask ventilation without difficulty Laryngoscope Size: Mac and 3 Grade View: Grade I Tube type: Oral Tube size: 7.0 mm Number of attempts: 1 Airway Equipment and Method: Stylet Placement Confirmation: ETT inserted through vocal cords under direct vision, positive ETCO2, CO2 detector and breath sounds checked- equal and bilateral Secured at: 21 cm Tube secured with: Tape Dental Injury: Teeth and Oropharynx as per pre-operative assessment

## 2021-05-03 NOTE — Transfer of Care (Signed)
Immediate Anesthesia Transfer of Care Note  Patient: [name]  Procedure(s) Performed: LAPAROSCOPIC GASTRIC SLEEVE RESECTION (Abdomen) UPPER GI ENDOSCOPY (Esophagus) HERNIA REPAIR HIATAL (Abdomen)  Patient Location: PACU  Anesthesia Type:General  Level of Consciousness: awake, drowsy and patient cooperative  Airway & Oxygen Therapy: Patient Spontanous Breathing and Patient connected to face mask oxygen  Post-op Assessment: Report given to RN and Post -op Vital signs reviewed and stable  Post vital signs: Reviewed and stable  Last Vitals:  Vitals Value Taken Time  BP 136/64 05/03/21 1315  Temp    Pulse 79 05/03/21 1319  Resp 13 05/03/21 1319  SpO2 100 % 05/03/21 1319  Vitals shown include unvalidated device data.  Last Pain:  Vitals:   05/03/21 0939  TempSrc:   PainSc: 0-No pain         Complications: No notable events documented.

## 2021-05-03 NOTE — Op Note (Signed)
Operative Note  [name]  [id number]  [id number]  05/03/2021   Surgeon: Romana Juniper MD   Assistant: Greer Pickerel MD   Procedure performed: laparoscopic sleeve gastrectomy, hiatal hernia repair, upper endoscopy   Preop diagnosis: Morbid obesity Body mass index is 42.03 kg/m. Post-op diagnosis/intraop findings: same   Specimens: fundus Retained items: none  EBL: minimal  Complications: none   Description of procedure: After obtaining informed consent and administration of chemical DVT prophylaxis in holding, the patient was taken to the operating room and placed supine on operating room table where general endotracheal anesthesia was initiated, preoperative antibiotics were administered, SCDs applied, and a formal timeout was performed. The abdomen was prepped and draped in usual sterile fashion. Peritoneal access was gained using a Visiport technique in the left upper quadrant and insufflation to 15 mmHg ensued without issue. Gross inspection revealed no evidence of injury.  Dense omental adhesions are noted to the supraumbilical region from previous hernia repair, these are left in situ.  Under direct visualization three more 5 mm trochars were placed in the right and left hemiabdomen and the 73mm trocar in the right paramedian upper abdomen. Bilateral laparoscopic assisted TAPS blocks were performed with Exparel diluted with 0.25 percent Marcaine with epinephrine. The patient was placed in steep Trendelenburg and the liver retractor was introduced through an incision in the upper midline and secured to the post externally to maintain the left lobe retracted anteriorly.  There was a hiatal hernia present.  The pars lucida was entered with Harmonic scalpel and the posterior aspect of the right and left crus were dissected out using Harmonic and blunt dissection. The hiatus was narrowed with 2 simple interrupted 0 Ethibonds secured with the ty-knot device. Using the Harmonic scalpel, the  greater curvature of the stomach was dissected away from the greater omentum and short gastric vessels were divided. This began 6 cm from the pylorus, and dissection proceeded until the left crus was clearly exposed. The 46 [place] VisiGi was then introduced and directed down towards the pylorus. This was placed to suction against the lesser curve. Serial fires of the linear cutting stapler with seam guards were then employed to create our sleeve. The first fire used a green load and ensured adequate room at the angularis incisura. One gold load and then several blue loads were then employed to create a narrow tubular stomach up to the angle of His. The excised stomach was then removed through our 15 mm trocar site within an Endo Catch bag.  The visigi was taken off of suction and a few puffs of air were introduced, inflating the sleeve. No bubbles were observed in the irrigation fluid around the stomach and the shape was noted to be evenly tubular without any narrowing at the angularis. The visigi was then removed. Upper endoscopy was performed by the assistant surgeon and the sleeve was noted to be airtight, the staple line was hemostatic. Please see his separate note. The endoscope was removed. A small amount of oozing on the distal staple line was addressed with clips. The 15 mm trocar site fascia in the right upper abdomen was closed with a 0 Vicryl using the laparoscopic suture passer under direct visualization. The liver retractor was removed under direct visualization. The abdomen was then desufflated and all remaining trochars removed. The skin incisions were closed with subcuticular Monocryl; benzoin, Steri-Strips and Band-Aids were applied The patient was then awakened, extubated and taken to PACU in stable condition.     All  counts were correct at the completion of the case.

## 2021-05-03 NOTE — Anesthesia Preprocedure Evaluation (Addendum)
Anesthesia Evaluation  Patient identified by MRN, date of birth, ID band Patient awake    Reviewed: Allergy & Precautions, NPO status , Patient's Chart, lab work & pertinent test results  History of Anesthesia Complications Negative for: history of anesthetic complications  Airway Mallampati: I  TM Distance: >3 FB Neck ROM: Full    Dental  (+) Missing, Dental Advisory Given   Pulmonary neg pulmonary ROS,  04/29/2021 SARS coronavirus NEG   breath sounds clear to auscultation       Cardiovascular hypertension, Pt. on medications (-) angina Rhythm:Regular Rate:Normal     Neuro/Psych negative neurological ROS     GI/Hepatic negative GI ROS, Neg liver ROS,   Endo/Other  Morbid obesity  Renal/GU negative Renal ROS     Musculoskeletal  (+) Arthritis ,   Abdominal (+) + obese,   Peds  Hematology  (+) Blood dyscrasia (Hb 10.8), anemia ,   Anesthesia Other Findings   Reproductive/Obstetrics                            Anesthesia Physical Anesthesia Plan  ASA: 3  Anesthesia Plan: General   Post-op Pain Management: Tylenol PO (pre-op)   Induction:   PONV Risk Score and Plan: 3 and Ondansetron, Dexamethasone and Scopolamine patch - Pre-op  Airway Management Planned: Oral ETT  Additional Equipment: None  Intra-op Plan:   Post-operative Plan: Extubation in OR  Informed Consent: I have reviewed the patients History and Physical, chart, labs and discussed the procedure including the risks, benefits and alternatives for the proposed anesthesia with the patient or authorized representative who has indicated his/her understanding and acceptance.     Dental advisory given  Plan Discussed with: CRNA and Surgeon  Anesthesia Plan Comments:        Anesthesia Quick Evaluation

## 2021-05-03 NOTE — Progress Notes (Signed)
PHARMACY CONSULT FOR:  Risk Assessment for Post-Discharge VTE Following Bariatric Surgery  Post-Discharge VTE Risk Assessment: This patient's probability of 30-day post-discharge VTE is increased due to the factors marked:   Female    Age >/=60 years    BMI >/=50 kg/m2    CHF    Dyspnea at Rest    Paraplegia  X  Non-gastric-band surgery    Operation Time >/=3 hr    Return to OR     Length of Stay >/= 3 d   Hx of VTE   Hypercoagulable condition   Significant venous stasis    Predicted probability of 30-day post-discharge VTE: 0.16% (mild)  Other patient-specific factors to consider: N/A  Recommendation for Discharge: No pharmacologic prophylaxis post-discharge  [name] is a 61 y.o. female who underwent laparoscopic gastric sleeve resection, upper GI endoscopy, and hiatal hernia repair on 05/03/21.   Case start: 1154 Case end: 1255   Allergies  Allergen Reactions   Aspirin Other (See Comments)    Severe stomach pain   Codeine Nausea And Vomiting   Cyclobenzaprine Other (See Comments)    Gi upset   Ibuprofen Other (See Comments)    Severe stomach pain   Penicillins Hives   Latex Rash    Patient Measurements: Height: 5\' 7"  (170.2 cm) Weight: 121.7 kg (268 lb 6 oz) IBW/kg (Calculated) : 61.6 Body mass index is 42.03 kg/m.  No results for input(s): WBC, HGB, HCT, PLT, APTT, CREATININE, LABCREA, CREATININE, CREAT24HRUR, MG, PHOS, ALBUMIN, PROT, ALBUMIN, AST, ALT, ALKPHOS, BILITOT, BILIDIR, IBILI in the last 72 hours. Estimated Creatinine Clearance: 82.7 mL/min (by C-G formula based on SCr of 0.99 mg/dL).    Past Medical History:  Diagnosis Date   Hypertension    followed by pcp   Incisional hernia    OA (osteoarthritis)    Pre-diabetes      Medications Prior to Admission  Medication Sig Dispense Refill Last Dose   amLODipine (NORVASC) 10 MG tablet Take 10 mg by mouth daily.   05/03/2021 at 0800   lisinopril (PRINIVIL,ZESTRIL) 40 MG tablet Take 40 mg  by mouth daily.  2 05/02/2021    Dimple Nanas, PharmD 05/03/2021 3:15 PM

## 2021-05-04 ENCOUNTER — Encounter (HOSPITAL_COMMUNITY): Payer: Self-pay | Admitting: Surgery

## 2021-05-04 LAB — CBC WITH DIFFERENTIAL/PLATELET
Abs Immature Granulocytes: 0.04 10*3/uL (ref 0.00–0.07)
Basophils Absolute: 0 10*3/uL (ref 0.0–0.1)
Basophils Relative: 0 %
Eosinophils Absolute: 0 10*3/uL (ref 0.0–0.5)
Eosinophils Relative: 0 %
HCT: 35.5 % — ABNORMAL LOW (ref 36.0–46.0)
Hemoglobin: 11 g/dL — ABNORMAL LOW (ref 12.0–15.0)
Immature Granulocytes: 0 %
Lymphocytes Relative: 8 %
Lymphs Abs: 0.9 10*3/uL (ref 0.7–4.0)
MCH: 27.5 pg (ref 26.0–34.0)
MCHC: 31 g/dL (ref 30.0–36.0)
MCV: 88.8 fL (ref 80.0–100.0)
Monocytes Absolute: 0.4 10*3/uL (ref 0.1–1.0)
Monocytes Relative: 4 %
Neutro Abs: 9.5 10*3/uL — ABNORMAL HIGH (ref 1.7–7.7)
Neutrophils Relative %: 88 %
Platelets: 243 10*3/uL (ref 150–400)
RBC: 4 MIL/uL (ref 3.87–5.11)
RDW: 13 % (ref 11.5–15.5)
WBC: 10.8 10*3/uL — ABNORMAL HIGH (ref 4.0–10.5)
nRBC: 0 % (ref 0.0–0.2)

## 2021-05-04 LAB — COMPREHENSIVE METABOLIC PANEL
ALT: 23 U/L (ref 0–44)
AST: 27 U/L (ref 15–41)
Albumin: 3.5 g/dL (ref 3.5–5.0)
Alkaline Phosphatase: 55 U/L (ref 38–126)
Anion gap: 9 (ref 5–15)
BUN: 15 mg/dL (ref 6–20)
CO2: 25 mmol/L (ref 22–32)
Calcium: 9.2 mg/dL (ref 8.9–10.3)
Chloride: 103 mmol/L (ref 98–111)
Creatinine, Ser: 0.91 mg/dL (ref 0.44–1.00)
GFR, Estimated: >60 mL/min (ref >60)
Glucose, Bld: 136 mg/dL — ABNORMAL HIGH (ref 70–99)
Potassium: 4.7 mmol/L (ref 3.5–5.1)
Sodium: 137 mmol/L (ref 135–145)
Total Bilirubin: 0.5 mg/dL (ref 0.3–1.2)
Total Protein: 7.2 g/dL (ref 6.5–8.1)

## 2021-05-04 LAB — MAGNESIUM: Magnesium: 1.9 mg/dL (ref 1.7–2.4)

## 2021-05-04 MED ORDER — TRAMADOL HCL 50 MG PO TABS: 50.0000 mg | ORAL_TABLET | Freq: Four times a day (QID) | ORAL | 0 refills | Status: DC | PRN

## 2021-05-04 MED ORDER — GABAPENTIN 100 MG PO CAPS: 200.0000 mg | ORAL_CAPSULE | Freq: Two times a day (BID) | ORAL | 0 refills | Status: AC

## 2021-05-04 MED ORDER — PANTOPRAZOLE SODIUM 40 MG PO TBEC: 40.0000 mg | DELAYED_RELEASE_TABLET | Freq: Every day | ORAL | 0 refills | Status: AC

## 2021-05-04 MED ORDER — ONDANSETRON 4 MG PO TBDP: 4.0000 mg | ORAL_TABLET | Freq: Four times a day (QID) | ORAL | 0 refills | Status: DC | PRN

## 2021-05-04 MED ORDER — ACETAMINOPHEN 500 MG PO TABS: 1000.0000 mg | ORAL_TABLET | Freq: Three times a day (TID) | ORAL | 0 refills | Status: AC

## 2021-05-04 NOTE — Progress Notes (Signed)
Nutrition Education Note  Received consult from Bariatric nurse coordinator to provide diet education for patient s/p bariatric surgery.  Discharge packet provided to patient.  Discussed 2 week post op diet with pt. Emphasized that liquids must be non carbonated, non caffeinated, and sugar free. Fluid goals discussed. Pt to follow up with outpatient bariatric RD for further diet progression after 2 weeks. Multivitamins and minerals also reviewed. Teach back method used, pt expressed understanding, expect good compliance.  If nutrition issues arise, please consult RD.  Clayton Bibles, MS, RD, LDN Inpatient Clinical Dietitian Contact information available via Amion

## 2021-05-04 NOTE — Anesthesia Postprocedure Evaluation (Signed)
Anesthesia Post Note  Patient: Aurelia Gras  Procedure(s) Performed: LAPAROSCOPIC GASTRIC SLEEVE RESECTION (Abdomen) UPPER GI ENDOSCOPY (Esophagus) HERNIA REPAIR HIATAL (Abdomen)     Patient location during evaluation: PACU Anesthesia Type: General Level of consciousness: awake and alert Pain management: pain level controlled Vital Signs Assessment: post-procedure vital signs reviewed and stable Respiratory status: spontaneous breathing, nonlabored ventilation, respiratory function stable and patient connected to nasal cannula oxygen Cardiovascular status: blood pressure returned to baseline and stable Postop Assessment: no apparent nausea or vomiting Anesthetic complications: no   No notable events documented.  Last Vitals:  Vitals:   05/04/21 0144 05/04/21 0518  BP: (!) 146/68 (!) 129/54  Pulse: 89 74  Resp: 18 16  Temp: (!) 36.4 C (!) 36.3 C  SpO2: 100% 100%    Last Pain:  Vitals:   05/04/21 0518  TempSrc: Oral  PainSc:                  Fayelynn Distel L [place]

## 2021-05-04 NOTE — Progress Notes (Signed)
S: No issues overnight.  Slept fairly well.  Pain and nausea are well controlled, tolerating liquids and walking in the halls without issue.  O: Vitals, labs, intake/output, and orders reviewed at this time.  No fever or tachycardia, normotensive, sats 100% on room air.  P.o. 420, urine output 1650.  1 episode of emesis recorded yesterday afternoon.  CMP unremarkable.  White count 10.8 (5.9 preop), hemoglobin 11 (10.8 preop)   Gen: A&Ox3, no distress  H&N: EOMI, atraumatic, neck supple Chest: unlabored respirations, RRR Abd: soft, minimally tender at extraction port site, nondistended, incision(s) c/d/i with Steri-Strips and Band-Aids.  No cellulitis or hematoma, no hernia Ext: warm, no edema Neuro: grossly normal  Lines/tubes/drains: PIV  A/P: Postop day 1 status post sleeve gastrectomy, doing well -Continue clear liquids and protein shakes -Continue prophylactic Lovenox, SCDs while in bed, frequent ambulation and aggressive pulmonary toilet -Discharge home today   Romana Juniper, MD [facility], [place]

## 2021-05-04 NOTE — Progress Notes (Signed)
Discharge instructions given to patient and all questions were answered.  

## 2021-05-05 LAB — SURGICAL PATHOLOGY

## 2021-05-06 ENCOUNTER — Telehealth (HOSPITAL_COMMUNITY): Payer: Self-pay | Admitting: *Deleted

## 2021-05-06 NOTE — Discharge Summary (Signed)
Physician Discharge Summary  [name] PQD:[id number] DOB: 03-Sep-1960 DOA: 05/03/2021  PCP: Sue Lush, PA-C  Admit date: 05/03/2021 Discharge date: 05/04/2021  Recommendations for Outpatient Follow-up:    Follow-up Information     Clovis Riley, MD. Go on 05/25/2021.   Specialty: General Surgery Why: at 11:30am.  Please arrive 15 minutes prior to your appointment time.  Thank you. Contact information: [address] [place] [address] [phone]         Surgery, [place]. Go on 06/22/2021.   Specialty: General Surgery Why: at 9:20am with Dr. Kae Heller.  Please arrive 15 minutes prior to your appointment time.  Thank you. Contact information: [place]  [id number] [phone]                Discharge Diagnoses:  Principal Problem:   Morbid obesity ([place])   Surgical Procedure: Laparoscopic Sleeve Gastrectomy, upper endoscopy  Discharge Condition: Good Disposition: Home  Diet recommendation: Postoperative sleeve gastrectomy diet (liquids only)  Filed Weights   05/03/21 0916 05/03/21 0940  Weight: 121.7 kg 121.7 kg     Hospital Course:  The patient was admitted for a planned laparoscopic sleeve gastrectomy. Please see operative note. Preoperatively the patient was given 5000 units of subcutaneous heparin for DVT prophylaxis. Postoperative prophylactic Lovenox dosing was started on the evening of postoperative day 0. ERAS protocol was used. On the evening of postoperative day 0, the patient was started on water and ice chips. On postoperative day 1 the patient had no fever or tachycardia and was tolerating water in their diet was gradually advanced throughout the day. The patient was ambulating without difficulty. Their vital signs are stable without fever or tachycardia. Their hemoglobin had remained stable.   The patient had received discharge instructions and counseling. They were deemed stable for  discharge and had met discharge criteria   Discharge Instructions   Allergies as of 05/04/2021       Reactions   Aspirin Other (See Comments)   Severe stomach pain   Codeine Nausea And Vomiting   Cyclobenzaprine Other (See Comments)   Gi upset   Ibuprofen Other (See Comments)   Severe stomach pain   Penicillins Hives   Latex Rash        Medication List     STOP taking these medications    lisinopril 40 MG tablet Commonly known as: ZESTRIL       TAKE these medications    acetaminophen 500 MG tablet Commonly known as: TYLENOL Take 2 tablets (1,000 mg total) by mouth every 8 (eight) hours for 5 days.   amLODipine 10 MG tablet Commonly known as: NORVASC Take 10 mg by mouth daily. Notes to patient: Monitor Blood Pressure Daily and keep a log for primary care physician.  You may need to make changes to your medications with rapid weight loss.     gabapentin 100 MG capsule Commonly known as: NEURONTIN Take 2 capsules (200 mg total) by mouth every 12 (twelve) hours.   ondansetron 4 MG disintegrating tablet Commonly known as: ZOFRAN-ODT Take 1 tablet (4 mg total) by mouth every 6 (six) hours as needed for nausea or vomiting.   pantoprazole 40 MG tablet Commonly known as: PROTONIX Take 1 tablet (40 mg total) by mouth daily.   traMADol 50 MG tablet Commonly known as: ULTRAM Take 1 tablet (50 mg total) by mouth every 6 (six) hours as needed (pain).  Follow-up Information     Clovis Riley, MD. Go on 05/25/2021.   Specialty: General Surgery Why: at 11:30am.  Please arrive 15 minutes prior to your appointment time.  Thank you. Contact information: [address] [place] [address] [phone]         Surgery, [place]. Go on 06/22/2021.   Specialty: General Surgery Why: at 9:20am with Dr. Kae Heller.  Please arrive 15 minutes prior to your appointment time.  Thank you. Contact information: [place] [address] [phone]                  The results of significant diagnostics from this hospitalization (including imaging, microbiology, ancillary and laboratory) are listed below for reference.    Significant Diagnostic Studies: No results found.  Labs: Basic Metabolic Panel: Recent Labs  Lab 04/29/21 1128 05/04/21 0409  NA 138 137  K 4.3 4.7  CL 106 103  CO2 26 25  GLUCOSE 103* 136*  BUN 17 15  CREATININE 0.99 0.91  CALCIUM 9.2 9.2  MG  --  1.9   Liver Function Tests: Recent Labs  Lab 04/29/21 1128 05/04/21 0409  AST 17 27  ALT 13 23  ALKPHOS 57 55  BILITOT 0.4 0.5  PROT 7.3 7.2  ALBUMIN 3.8 3.5    CBC: Recent Labs  Lab 04/29/21 1128 05/04/21 0409  WBC 5.9 10.8*  NEUTROABS 3.0 9.5*  HGB 10.8* 11.0*  HCT 36.4 35.5*  MCV 90.1 88.8  PLT 244 243    CBG: Recent Labs  Lab 04/29/21 1103 05/03/21 0923  GLUCAP 162* 111*    Principal Problem:   Morbid obesity ([place])   Signed:  [facility] [facility], [place] [phone] 05/06/2021, 10:18 AM

## 2021-05-10 ENCOUNTER — Telehealth (HOSPITAL_COMMUNITY): Payer: Self-pay | Admitting: *Deleted

## 2021-05-10 NOTE — Telephone Encounter (Signed)
1.  Tell me about your pain and pain management? Pt denies any pain.  2.  Let's talk about fluid intake.  How much total fluid are you taking in? Pt states that she is getting in at least 64oz of fluid including protein shakes, bottled water, and sugar free popsicles. Pt instructed to assess status and suggestions daily utilizing Hydration Action Plan on discharge folder and to call [facility] if in the "red zone".   3.  How much protein have you taken in the last 2 days? Pt states she is meeting her goal of 60g of protein each day with the protein shakes.  4.  Have you had nausea?  Tell me about when have experienced nausea and what you did to help? Pt denies nausea.   5.  Has the frequency or color changed with your urine? Pt states that she is urinating "fine" with no changes in frequency or urgency.     6.  Tell me what your incisions look like? "Incisions look fine". Pt denies a fever, chills.  Pt states incisions are not swollen, open, or draining.  Pt encouraged to call [facility] if incisions change.   7.  Have you been passing gas? BM? Pt states that she is having BMs. Last BM 05/07/21.  Pt instructed to take either Miralax or MoM as instructed per "Gastric Bypass/Sleeve Discharge Home Care Instructions".  Pt to call surgeon's office if not able to have BM with medication.   8.  If a problem or question were to arise who would you call?  Do you know contact numbers for [facility] [facility], [facility], and [facility]? Pt denies dehydration symptoms.  Pt can describe s/sx of dehydration.  Pt knows to call [facility] for surgical, [facility] for nutrition, and [facility] for non-urgent questions or concerns.   9.  How has the walking going? Pt states she is walking around and able to be active without difficulty.   10. Are you still using your incentive spirometer?  If so, how often? Pt does not have I.S. Pt instructed to perform TCDB  exercises.  11.  How are your vitamins and calcium going?  How are you taking them? Pt states that she is  taking her supplements and vitamins without difficulty.  Reminded patient that the first 30 days post-operatively are important for successful recovery.  Practice good hand hygiene, wearing a mask when appropriate (since optional in most places), and minimizing exposure to people who live outside of the home, especially if they are exhibiting any respiratory, GI, or illness-like symptoms.

## 2021-05-17 ENCOUNTER — Other Ambulatory Visit: Payer: Self-pay

## 2021-05-17 ENCOUNTER — Encounter: Payer: BC Managed Care – PPO | Attending: Surgery | Admitting: Skilled Nursing Facility1

## 2021-05-17 DIAGNOSIS — E669 Obesity, unspecified: Secondary | ICD-10-CM | POA: Diagnosis not present

## 2021-05-19 NOTE — Progress Notes (Signed)
2 Week Post-Operative Nutrition Class   Patient was seen on 05/17/2021 for Post-Operative Nutrition education at the [facility].    Surgery date: 05/03/2021 Surgery type: sleeve Start weight at [facility]: 265 Bowel Habits: Every day to every other day no complaints     The following the learning objectives were met by the patient during this course: Identifies Phase 3 (Soft, High Proteins) Dietary Goals and will begin from 2 weeks post-operatively to 2 months post-operatively Identifies appropriate sources of fluids and proteins  Identifies appropriate fat sources and healthy verses unhealthy fat types   States protein recommendations and appropriate sources post-operatively Identifies the need for appropriate texture modifications, mastication, and bite sizes when consuming solids Identifies appropriate fat consumption and sources Identifies appropriate multivitamin and calcium sources post-operatively Describes the need for physical activity post-operatively and will follow MD recommendations States when to call healthcare provider regarding medication questions or post-operative complications   Handouts given during class include: Phase 3A: Soft, High Protein Diet Handout Phase 3 High Protein Meals Healthy Fats   Follow-Up Plan: Patient will follow-up at [facility] in 6 weeks for 2 month post-op nutrition visit for diet advancement per MD.

## 2021-05-23 ENCOUNTER — Telehealth: Payer: Self-pay | Admitting: Skilled Nursing Facility1

## 2021-05-23 NOTE — Telephone Encounter (Signed)
RD called pt to verify fluid intake once starting soft, solid proteins 2 week post-bariatric surgery.   Daily Fluid intake:  Daily Protein intake: Bowel Habits:   Concerns/issues:    LVM 

## 2021-06-28 ENCOUNTER — Encounter: Payer: BC Managed Care – PPO | Attending: Surgery | Admitting: Skilled Nursing Facility1

## 2021-06-28 ENCOUNTER — Other Ambulatory Visit: Payer: Self-pay

## 2021-06-28 DIAGNOSIS — E669 Obesity, unspecified: Secondary | ICD-10-CM | POA: Diagnosis not present

## 2021-06-28 NOTE — Progress Notes (Signed)
Bariatric Nutrition Follow-Up Visit ?Medical Nutrition Therapy  ?Appt Start Time: 11:45   End Time: 12:15 ? ?2 Months Post-Operative Gastric Surgery ?Surgery Date: 05/03/2021 ? ? ?NUTRITION ASSESSMENT ?  ? ?Anthropometrics  ?Surgery date: 05/03/2021 ?Surgery type: sleeve ?Start weight at [facility]: 265 ? ? ?Body Composition Scale Date 06/28/2021  ?Weight  lbs 242.0  ?Total Body Fat  % 44.1  ?   Visceral Fat 15  ?Fat-Free Mass  % 55.8  ?   Total Body Water  % 42.4  ?   Muscle-Mass  lbs 32.1  ?BMI 37.6  ?Body Fat Displacement ---  ?      Torso  lbs 66.1  ?      Left Leg  lbs 13.2  ?      Right Leg  lbs 13.2  ?      Left Arm  lbs 6.6  ?      Right Arm  lbs 6.6  ? ?Clinical  ?Medical hx: HTN, prediabetes ?Medications: see list  ?Labs: A1C 6.0, vitamin D 15.5 ?Notable signs/symptoms: none reported  ?Any previous deficiencies? Yes: vitamin D (advised to go to her doctor to get prescription strength) ?  ?Lifestyle & Dietary Hx ? ?Pt states she was hoping to lose 70 pounds by this time.  Dietitian educated patient on the importance of having realistic goals. ?Pt states she wants more variety in her foods ?Pt states she adds Lemon and ginger to her water ?Pt states she had some kidney function reduction, and she is now drinking more water to avoid decreased kidney function. wakes in the night and drinks water during prayer. ?Pt states she rarely eats breakfast due to work ?Pt states she is tired of the chewable multi vitamin, the calcium is great ?Pt states her activity is walking, planning to buy a bike and a treadmill for the house. ? ? ?Estimated daily fluid intake: 67 oz ?Estimated daily protein intake: 60 g ?Supplements: multi vitamin and calcium ?Current average weekly physical activity: walking  ? ?24-Hr Dietary Recall ?First Meal: rarely eats breakfast ?Snack: yogurt  ?Second Meal: cheese and lunch meat roll ups ?Snack: Ensure  ?Third Meal: grilled chicken taco ?Snack: two strips of [place] bacon ?Beverages: Water w/ lemon  or ginger ? ?Post-Op Goals/ Signs/ Symptoms ?Using straws: no ?Drinking while eating: no ?Chewing/swallowing difficulties: no ?Changes in vision: no ?Changes to mood/headaches: no ?Hair loss/changes to skin/nails: no ?Difficulty focusing/concentrating: no ?Sweating: no ?Limb weakness: no ?Dizziness/lightheadedness: no ?Palpitations: no  ?Carbonated/caffeinated beverages: no ?N/V/D/C/Gas: no ?Abdominal pain: no ?Dumping syndrome: no ? ? ?  ?NUTRITION DIAGNOSIS  ?Overweight/obesity ([place]-3.3) related to past poor dietary habits and physical inactivity as evidenced by completed bariatric surgery and following dietary guidelines for continued weight loss and healthy nutrition status. ?  ?  ?NUTRITION INTERVENTION ?Nutrition counseling (C-1) and education (E-2) to facilitate bariatric surgery goals, including: ?Diet advancement to the next phase (phase 4) now including non-starchy vegetables ?The importance of consuming adequate calories as well as certain nutrients daily due to the body's need for essential vitamins, minerals, and fats ?The importance of daily physical activity and to reach a goal of at least 150 minutes of moderate to vigorous physical activity weekly (or as directed by their physician) due to benefits such as increased musculature and improved lab values ?The importance of intuitive eating specifically learning hunger-satiety cues and understanding the importance of learning a new body: The importance of mindful eating to avoid grazing behaviors  ? ? ?Goals: ?Work  on serving sizes for protein to 2-3 ounces ?Introduce non-starchy vegetables, 1/2 cup minimum to 1 cup ?Try capsule for multivitamin ?-Continue to aim for a minimum of 64 fluid ounces 7 days a week with at least 30 ounces being plain water ? ?-Eat non-starchy vegetables 2 times a day 7 days a week ? ?-Start out with soft cooked vegetables today and tomorrow; if tolerated begin to eat raw vegetables or cooked including salads ? ?-Eat your 3  ounces of protein first then start in on your non-starchy vegetables; once you understand how much of your meal leads to satisfaction and not full while still eating 3 ounces of protein and non-starchy vegetables you can eat them in any order  ? ?-Continue to aim for 30 minutes of activity at least 5 times a week ? ?-Do NOT cook with/add to your food: alfredo sauce, cheese sauce, barbeque sauce, ketchup, fat back, butter, bacon grease, grease, Crisco, OR SUGAR ? ? ?Handouts Provided Include  ?Phase 4 handout (Protein + non-starchy vegetables) ? 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 ?RD's Notes for Next Visit ?Assess adherence to pt chosen goal ? ?MONITORING & EVALUATION ?Dietary intake, weekly physical activity, body weight ? ?Next Steps ?Patient is to follow-up in June ?

## 2021-09-07 ENCOUNTER — Other Ambulatory Visit (HOSPITAL_COMMUNITY): Payer: Self-pay

## 2021-09-20 ENCOUNTER — Encounter: Payer: BC Managed Care – PPO | Attending: Surgery | Admitting: Skilled Nursing Facility1

## 2021-09-20 DIAGNOSIS — E669 Obesity, unspecified: Secondary | ICD-10-CM | POA: Diagnosis present

## 2021-09-22 ENCOUNTER — Encounter: Payer: Self-pay | Admitting: Skilled Nursing Facility1

## 2021-09-22 NOTE — Progress Notes (Signed)
Follow-up visit:  Post-Operative sleeve Surgery  Medical Nutrition Therapy:  Appt start time: 6:00pm end time:  7:00pm  Primary concerns today: Post-operative Bariatric Surgery Nutrition Management 6 Month Post-Op Class  Anthropometrics  Surgery date: 05/03/2021 Surgery type: sleeve Start weight at [facility]: 265   Body Composition Scale Date 06/28/2021 09/22/2021  Weight  lbs 242.0 219.6  Total Body Fat  % 44.1 41.6     Visceral Fat 15 13  Fat-Free Mass  % 55.8 58.3     Total Body Water  % 42.4 43.6     Muscle-Mass  lbs 32.1 31.9  BMI 37.6 34.1  Body Fat Displacement ---         Torso  lbs 66.1 56.5        Left Leg  lbs 13.2 11.3        Right Leg  lbs 13.2 11.3        Left Arm  lbs 6.6 5.6        Right Arm  lbs 6.6 5.6   Clinical  Medical hx: HTN, prediabetes Medications: see list  Labs: A1C 6.0, vitamin D 15.5 Notable signs/symptoms: none reported  Any previous deficiencies? Yes: vitamin D (advised to go to her doctor to get prescription strength)    Information Reviewed/ Discussed During Appointment: -Review of composition scale numbers -Fluid requirements (64-100 ounces) -Protein requirements (60-80g) -Strategies for tolerating diet -Advancement of diet to include Starchy vegetables -Barriers to inclusion of new foods -Inclusion of appropriate multivitamin and calcium supplements  -Exercise recommendations   Fluid intake: adequate   Medications: See List Supplementation: appropriate    Using straws: no Drinking while eating: no Having you been chewing well: yes Chewing/swallowing difficulties: no Changes in vision: no Changes to mood/headaches: no Hair loss/[place] to skin/Changes to nails: no Any difficulty focusing or concentrating: no Sweating: no Dizziness/Lightheaded: no Palpitations: no  Carbonated beverages: no N/V/D/C/GAS: no Abdominal Pain: no Dumping syndrome: no  Recent physical activity:  ADL's  Progress Towards Goal(s):  In  Progress Teaching method utilized: Visual & Auditory  Demonstrated degree of understanding via: Teach Back  Readiness Level: Action Barriers to learning/adherence to lifestyle change: none identified  Handouts given during visit include: Phase V diet Progression  Goals Sheet The Benefits of Exercise are endless..... Support Group Topics   Teaching Method Utilized:  Visual Auditory Hands on  Demonstrated degree of understanding via:  Teach Back   Monitoring/Evaluation:  Dietary intake, exercise, and body weight. Follow up in 3 months for 9 month post-op visit.

## 2021-12-21 ENCOUNTER — Encounter: Payer: Self-pay | Admitting: Skilled Nursing Facility1

## 2021-12-21 ENCOUNTER — Encounter: Payer: BC Managed Care – PPO | Attending: Surgery | Admitting: Skilled Nursing Facility1

## 2021-12-21 DIAGNOSIS — E669 Obesity, unspecified: Secondary | ICD-10-CM | POA: Diagnosis present

## 2021-12-21 NOTE — Progress Notes (Signed)
Follow-up visit:  Post-Operative sleeve Surgery  Primary concerns today: Post-operative Bariatric Surgery Nutrition Management 6 Month Post-Op Class  Anthropometrics  Surgery date: 05/03/2021 Surgery type: sleeve Start weight at [facility]: 265 Weight: 209.8 pounds  Body Composition Scale Date 06/28/2021 09/22/2021 12/21/2021  Weight  lbs 242.0 219.6 209.8  Total Body Fat  % 44.1 41.6 40.4     Visceral Fat '15 13 12  '$ Fat-Free Mass  % 55.8 58.3 59.5     Total Body Water  % 42.4 43.6 44.2     Muscle-Mass  lbs 32.1 31.9 31.6  BMI 37.6 34.1 32.5  Body Fat Displacement ---          Torso  lbs 66.1 56.5 52.5        Left Leg  lbs 13.2 11.3 10.5        Right Leg  lbs 13.2 11.3 10.5        Left Arm  lbs 6.6 5.6 5.2        Right Arm  lbs 6.6 5.6 5.2   Clinical  Medical hx: HTN, prediabetes Medications: see list  Labs: A1C 6.0, vitamin D 15.5 Notable signs/symptoms: none reported  Any previous deficiencies? Yes: vitamin D (advised to go to her doctor to get prescription strength)   Pt states she workout at [employer]. Pt states she does have knee pain. Pt states will walk with her son at his gym once a week. Pt states she has added in some weight machines for her legs.   Pt states she struggles with a weak appetite. Pt states she does not eat breakfast due to work. Pt states she will eat out on the weekend sometimes.  Pt states her kidneys are what motivate her to keep making changes and stick with them.   24 hr recall: Breakfast: skipped Snack: Lunch: meatballs + mixed veggies Snack: nuts Dinner: [place] legs + mixed veggies  Beverages: water   Fluid intake: adequate   Medications: See List Supplementation: multi and calcium   Using straws: no Drinking while eating: no Having you been chewing well: yes Chewing/swallowing difficulties: no Changes in vision: no Changes to mood/headaches: no Hair loss/Cahnges to skin/Changes to nails: no Any difficulty focusing or  concentrating: no Sweating: no Dizziness/Lightheaded: no Palpitations: no  Carbonated beverages: no N/V/D/C/GAS: no Abdominal Pain: no Dumping syndrome: no  Recent physical activity:  6 days a week walking 60 minutes  Progress Towards Goal(s):  In Progress Teaching method utilized: Visual & Auditory  Demonstrated degree of understanding via: Teach Back  Readiness Level: Action Barriers to learning/adherence to lifestyle change: none identified  Goals: Add in 2-3 days a week weight resistance  Do not eat fried food Choose complex whole grains and carbohydrates   Teaching Method Utilized:  Visual Auditory Hands on  Demonstrated degree of understanding via:  Teach Back   Monitoring/Evaluation:  Dietary intake, exercise, and body weight. Follow up in [facility] January

## 2022-02-03 IMAGING — DX DG CHEST 2V
2 series · 2 of 2 positions shown · non-contrast
Comparison: 10/08/2011

CLINICAL DATA: Preoperative evaluation

EXAM:
CHEST - 2 VIEW

[dg chest 2 view (1 of 2)]
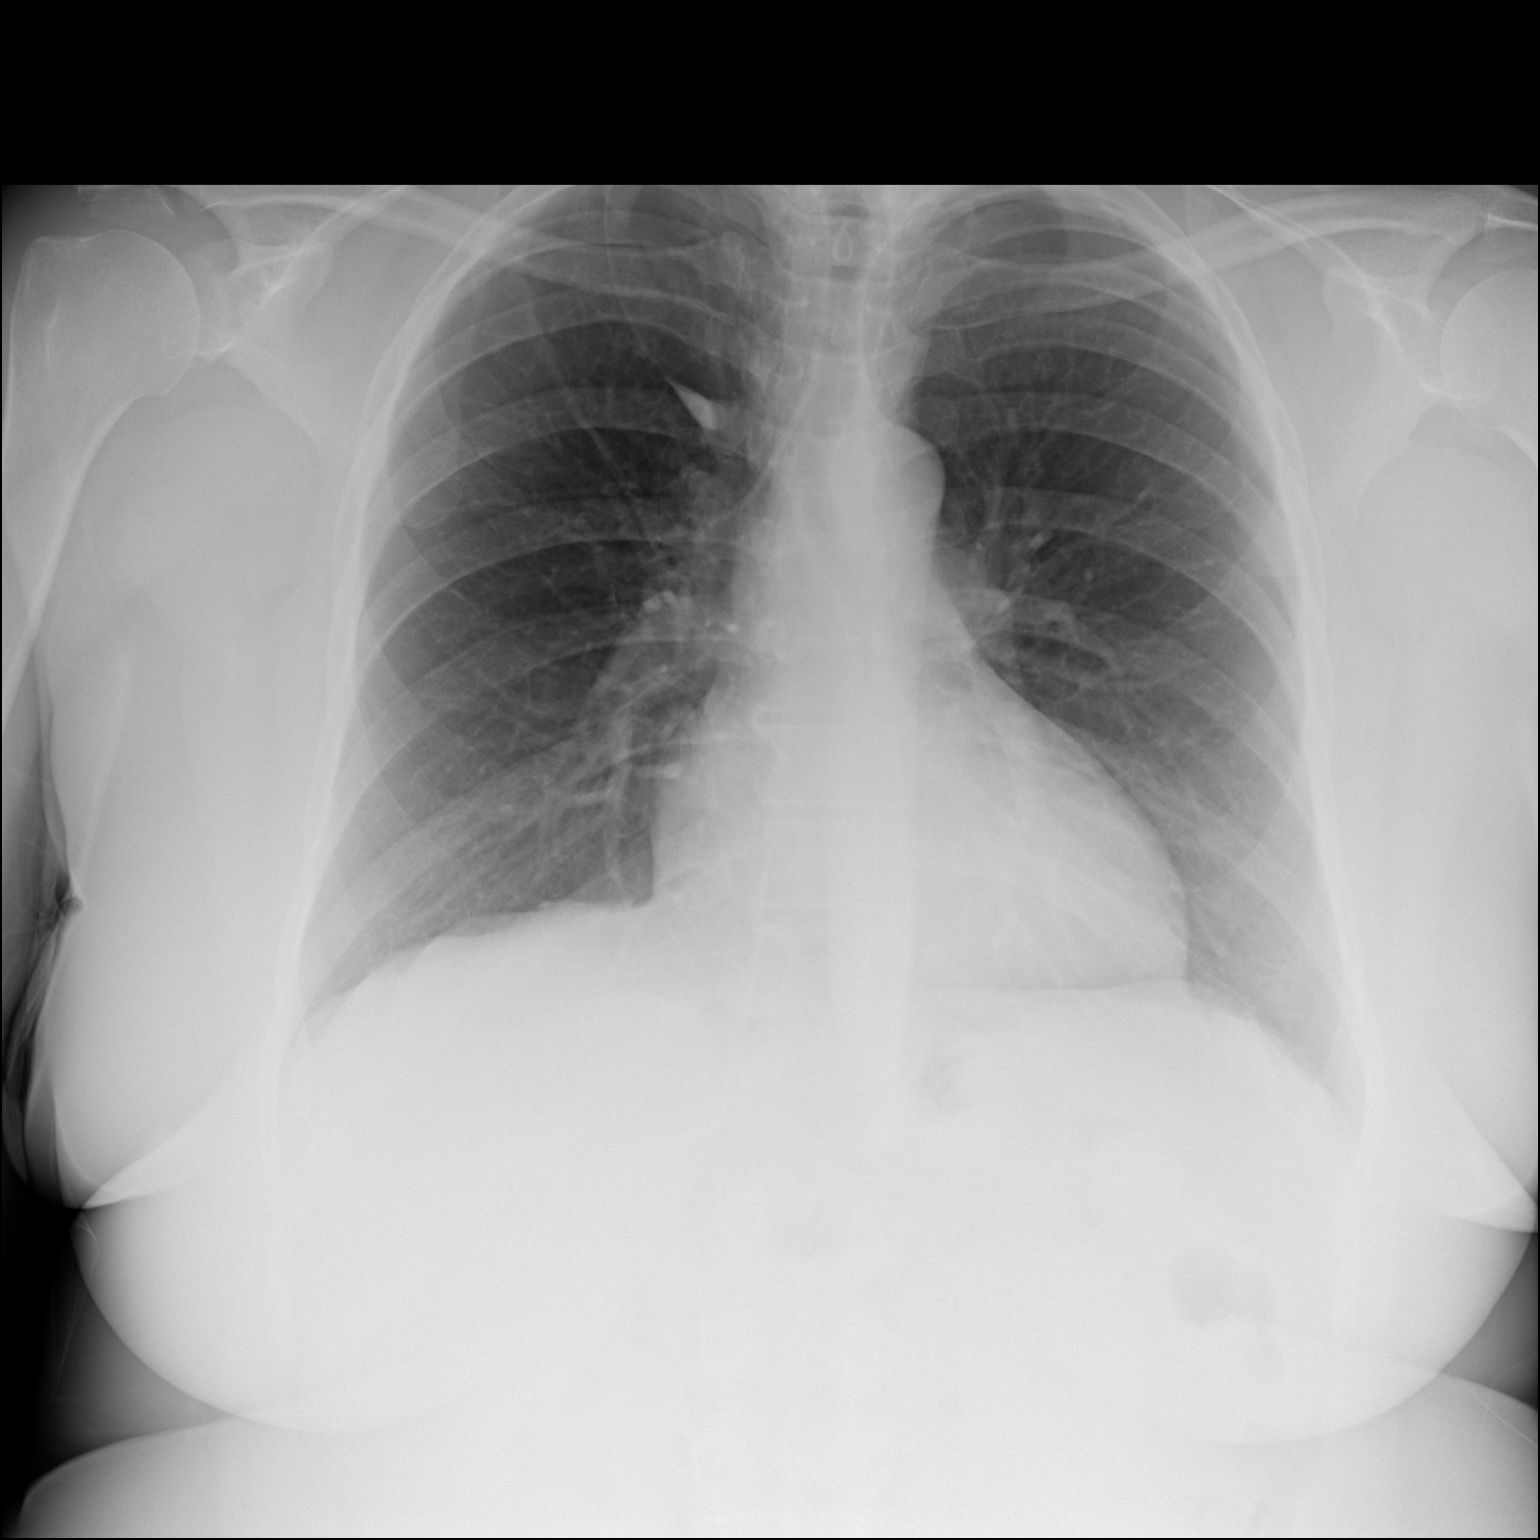

[dg chest 2 view (2 of 2)]
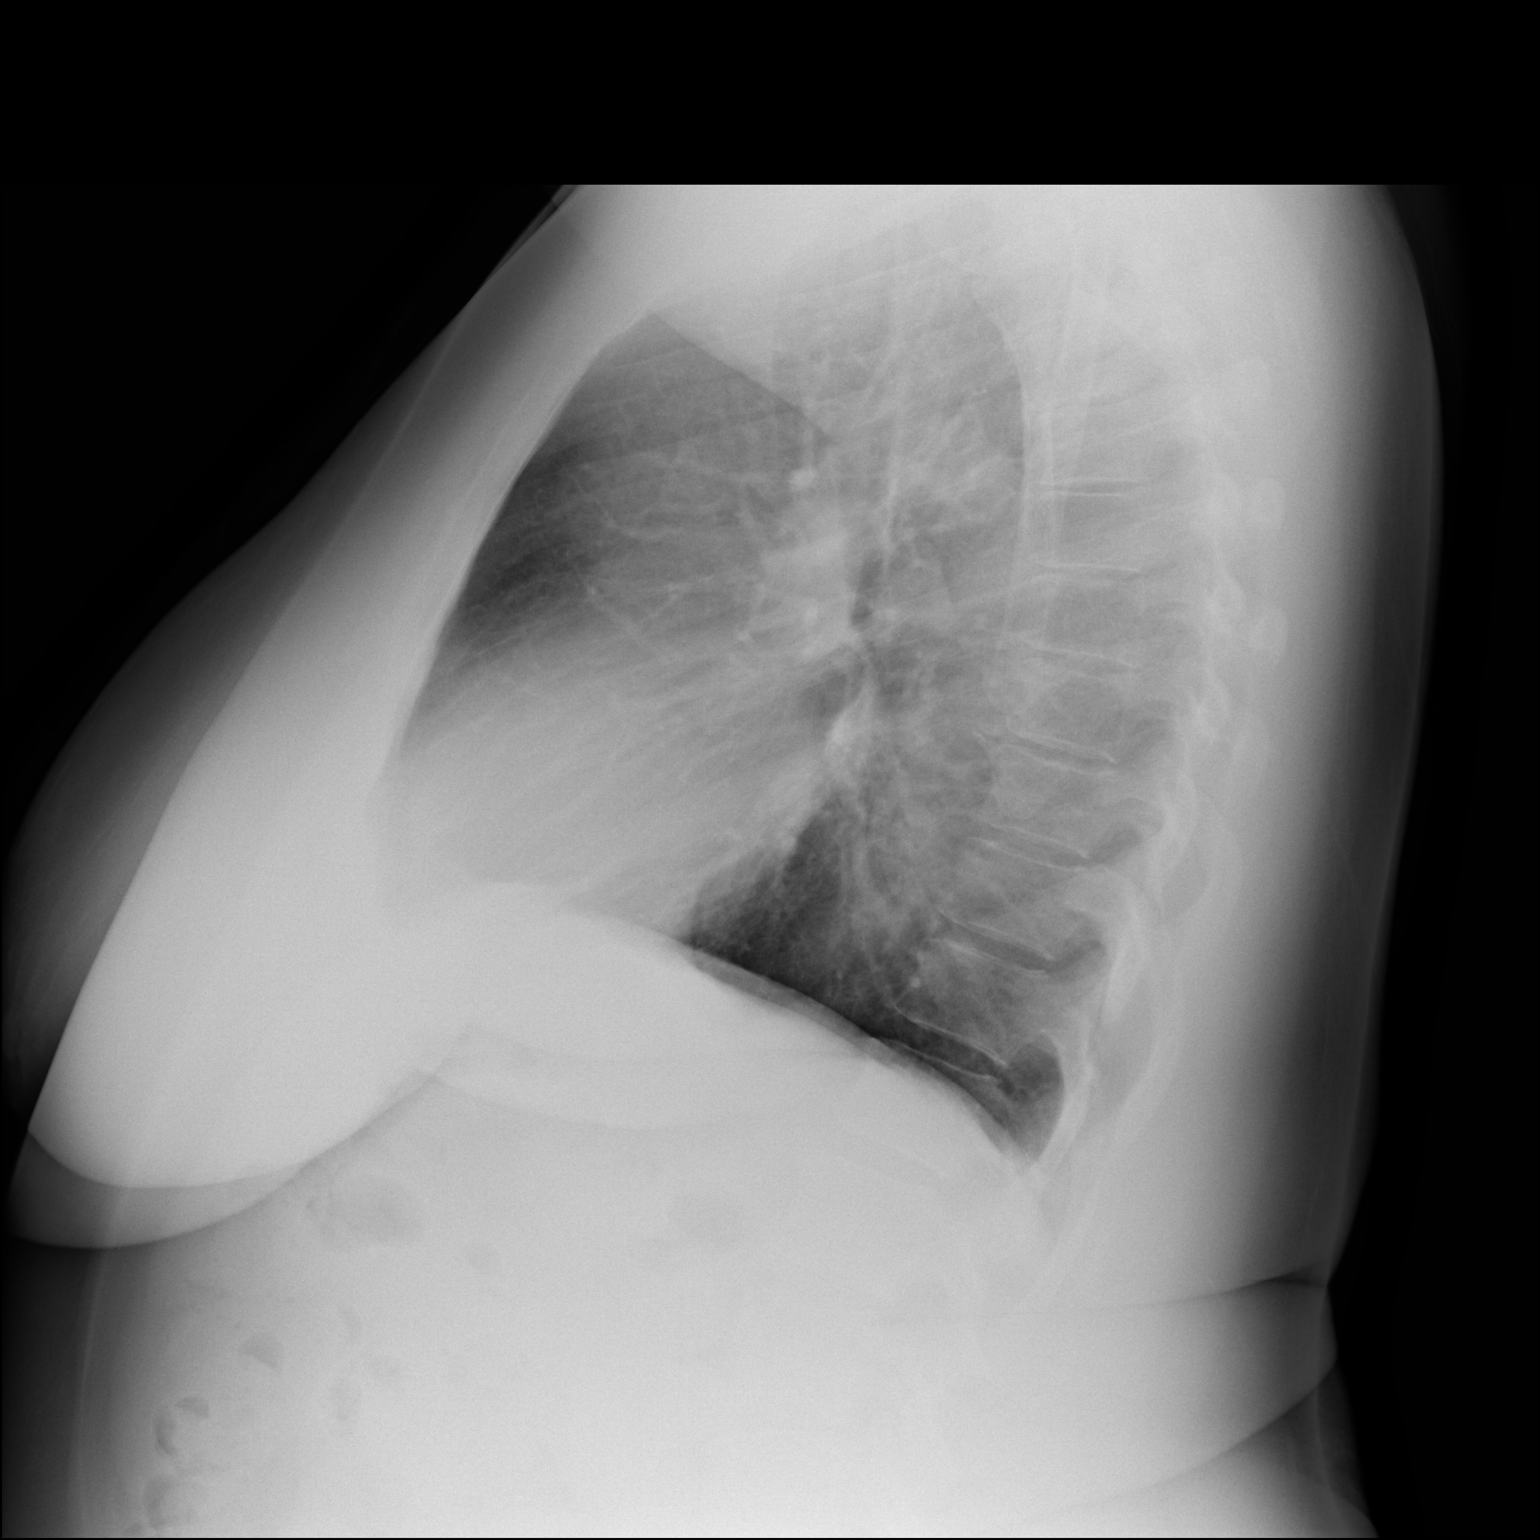

[2 of 2 positions shown; findings below may reference images not displayed]

FINDINGS: Normal heart size, mediastinal contours, and pulmonary vascularity.

Azygos fissure noted.

Lungs clear.

No pulmonary infiltrate, pleural effusion, or pneumothorax.

Osseous structures unremarkable.
IMPRESSION: Normal exam.

## 2022-02-03 IMAGING — RF DG UGI W SINGLE CM
10 of 13 series · 14 of 24 positions shown · non-contrast
Comparison: None.

CLINICAL DATA: Preoperative assessment for bariatric surgery.

EXAM:
UPPER GI SERIES WITH KUB
TECHNIQUE: After obtaining a scout radiograph a routine upper GI series was
performed using thin barium.
FLUOROSCOPY TIME:  Fluoroscopy Time:  2 minutes and 24 seconds
Radiation Exposure Index (if provided by the fluoroscopic device):
42 mGy
Number of Acquired Spot Images:

[Series 1: one shot · 0.14mm/px · 1 of 1 slices shown (1 of 2)]
[im 1/1]
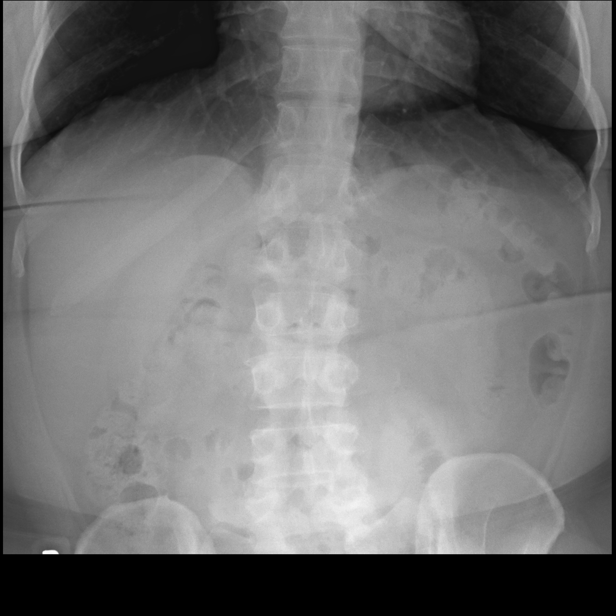

[Series 2: sequence · 0.29mm/px · 1 of 23 frames shown (1 of 8)]
[frame 6/23]
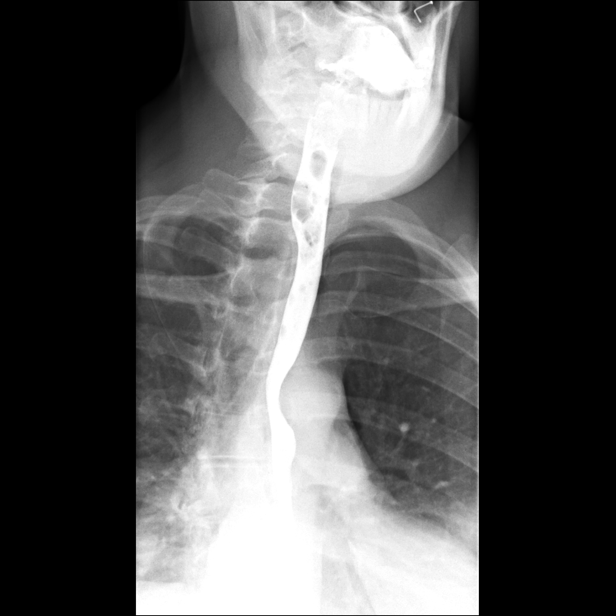

[Series 3: sequence · 2 of 91 frames shown (2 of 8)]
[frame 14/91]
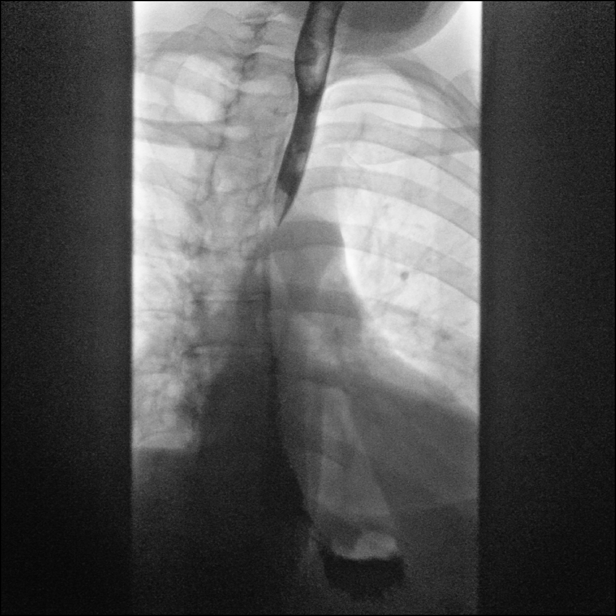
[frame 68/91]
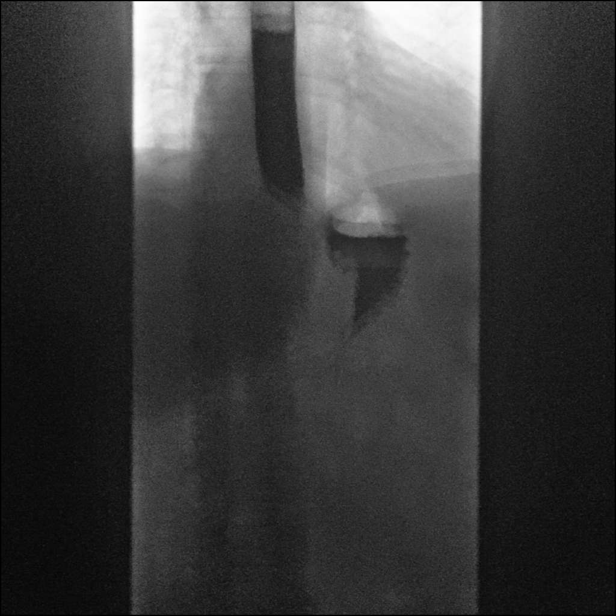

[Series 4: sequence · 2 of 34 frames shown (3 of 8)]
[frame 6/34]
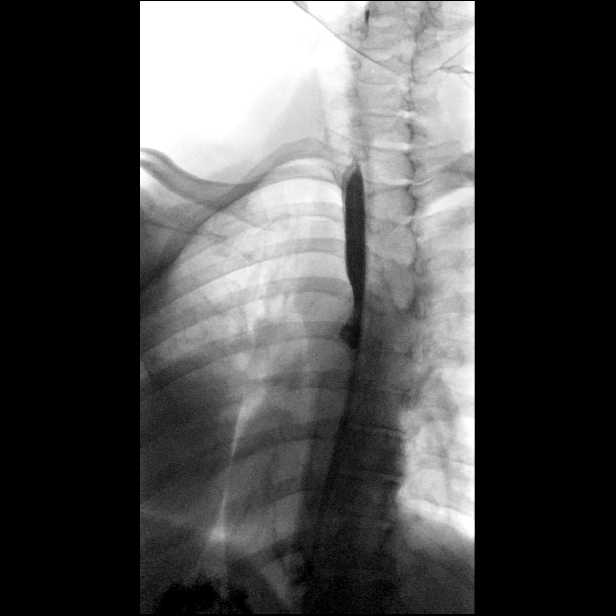
[frame 29/34]
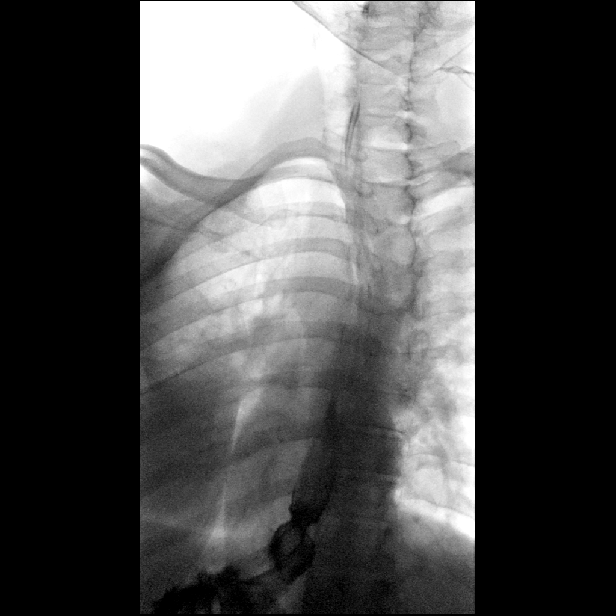

[Series 5: sequence · 2 of 37 frames shown (4 of 8)]
[frame 6/37]
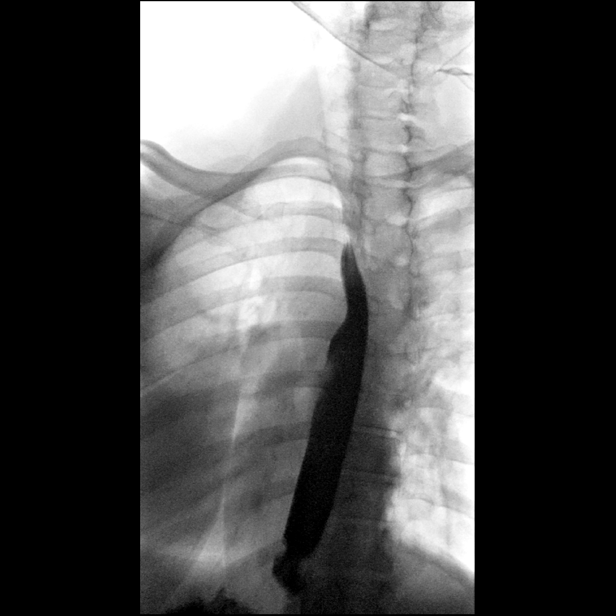
[frame 22/37]
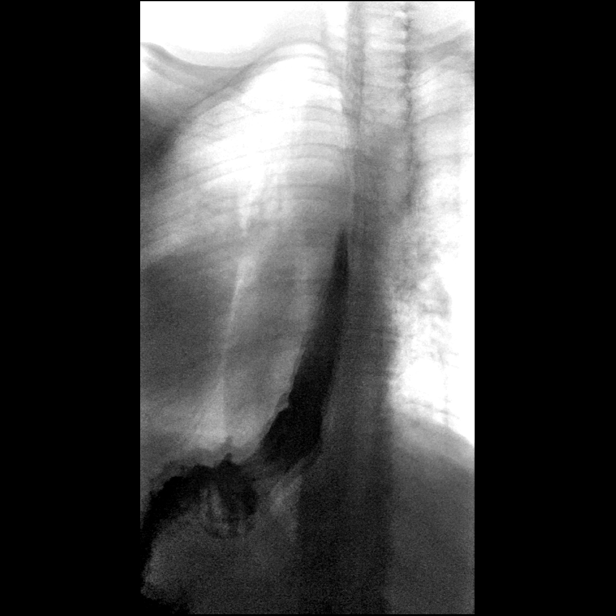

[Series 6: sequence · 0.29mm/px · 1 of 1 slices shown (5 of 8)]
[im 1/1]
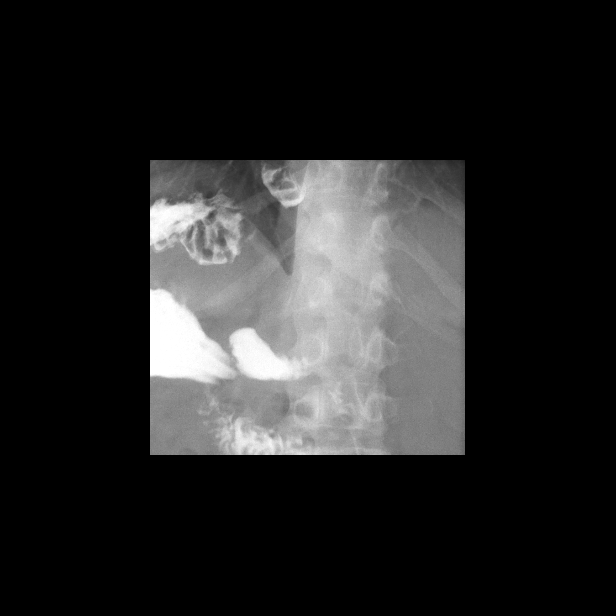

[Series 8: sequence · 2 of 111 frames shown (6 of 8)]
[frame 1/111]
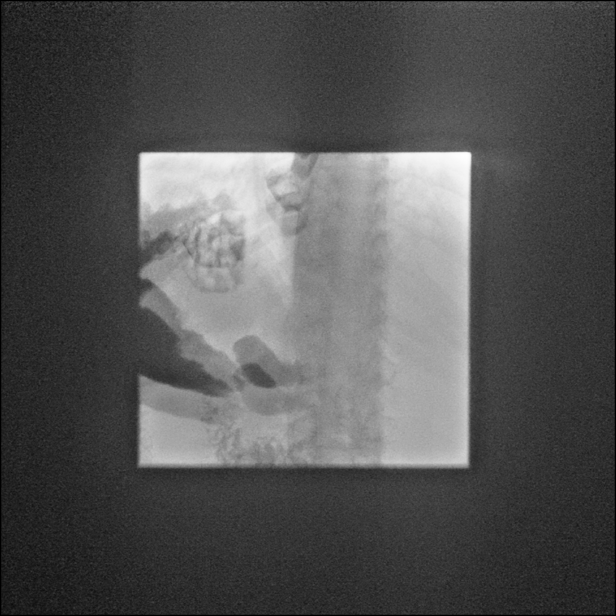
[frame 95/111]
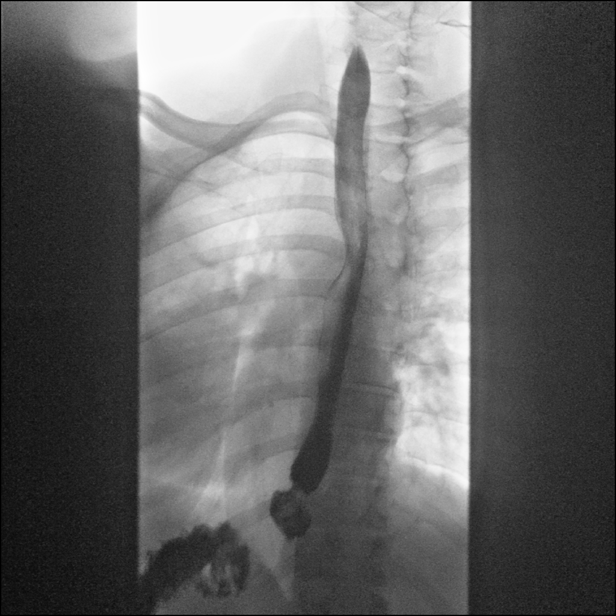

[Series 9: sequence · 0.29mm/px · 1 of 1 slices shown (7 of 8)]
[im 1/1]
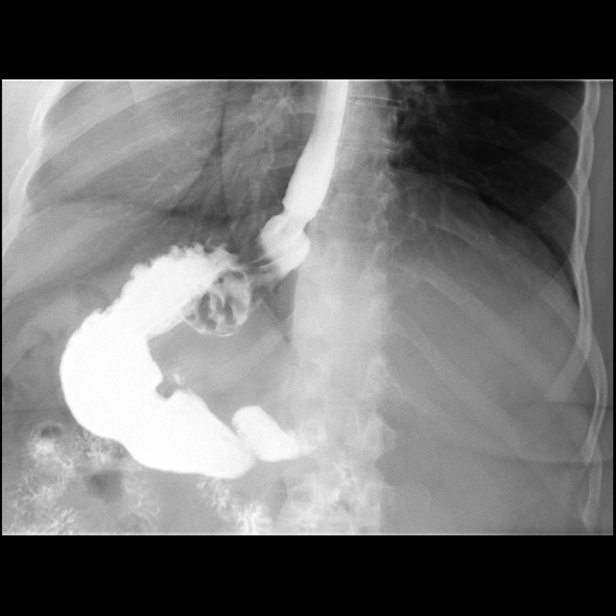

[Series 12: sequence · 0.29mm/px · 1 of 1 slices shown (8 of 8)]
[im 1/1]
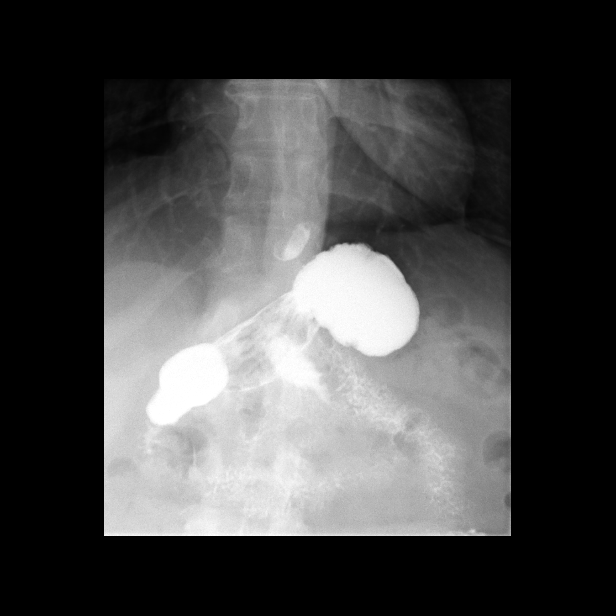

[Series 14: one shot · 1 of 1 slices shown (2 of 2)]
[im 1/1]
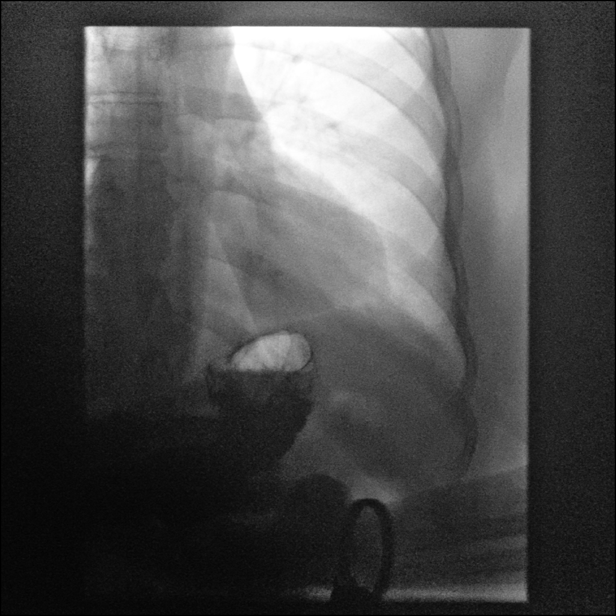

[14 of 24 positions shown; findings below may reference images not displayed]

FINDINGS: pre-procedure KUB shows a normal bowel gas pattern. No unexpected
abdominopelvic calcification.

Single contrast imaging of the esophagus is unremarkable. Esophageal
motility is normal. Small sliding type hiatal hernia evident.

Stomach is normally positioned. No gross mass lesion or diverticulum
noted in the stomach. Gastric emptying is prompt. Pylorus and
duodenal bulb are normal. Duodenal C-loop and ligament of Treitz are
normally position.

13 mm barium tablet passes readily into the stomach when taken with
water.
IMPRESSION: Small sliding type hiatal hernia.  Otherwise unremarkable exam.

## 2022-02-23 ENCOUNTER — Ambulatory Visit
Admission: EM | Admit: 2022-02-23 | Discharge: 2022-02-23 | Disposition: A | Discharge status: Home / Self Care | Payer: BC Managed Care – PPO | Attending: Physician Assistant | Admitting: Physician Assistant

## 2022-02-23 DIAGNOSIS — Z1152 Encounter for screening for COVID-19: Secondary | ICD-10-CM | POA: Diagnosis present

## 2022-02-23 DIAGNOSIS — J0100 Acute maxillary sinusitis, unspecified: Secondary | ICD-10-CM | POA: Diagnosis present

## 2022-02-23 LAB — RESP PANEL BY RT-PCR (FLU A&B, COVID) ARPGX2
Influenza A by PCR: POSITIVE — AB
Influenza B by PCR: NEGATIVE
SARS Coronavirus 2 by RT PCR: NEGATIVE

## 2022-02-23 MED ORDER — KETOROLAC TROMETHAMINE 30 MG/ML IJ SOLN
30.0000 mg | Freq: Once | INTRAMUSCULAR | Status: AC
  Administered 2022-02-23: 30 mg via INTRAMUSCULAR

## 2022-02-23 NOTE — ED Provider Notes (Signed)
[facility]    CSN: [id number] Arrival date & time: 02/23/22  1022      History   Chief Complaint Chief Complaint  Patient presents with   URI    HPI [name] is a 61 y.o. female.   Patient here today for evaluation of sinus headache, congestion, and post nasal drip that started 2 days ago. She has not had fever. She has associated dental pain from sinus congestion and pressure. She has tried OTC meds without significant relief. She requests injection for headache if possible.    URI Presenting symptoms: congestion and cough   Presenting symptoms: no ear pain, no fever and no sore throat   Associated symptoms: no wheezing     Past Medical History:  Diagnosis Date   Hypertension    followed by pcp   Incisional hernia    OA (osteoarthritis)    Pre-diabetes     Patient Active Problem List   Diagnosis Date Noted   Morbid obesity ([place]) 05/03/2021   Primary osteoarthritis of left knee 04/20/2014   Obesity (BMI 30-39.9) 02/06/2014   Anemia 08/08/2013   Asthma 08/08/2013   Bilateral knee pain 08/08/2013   Right foot pain 08/08/2013   Essential hypertension 06/23/2013   S/P hysterectomy - Davinci  10/07/2012   Menorrhagia with fibroids 10/07/2012    Past Surgical History:  Procedure Laterality Date   BUNIONECTOMY Right 2020   great toe bunionectomy/  3rd toe pinning of fracture   CARPAL TUNNEL RELEASE Right [place] N/A 05/03/2021   Procedure: HERNIA REPAIR HIATAL;  Surgeon: Clovis Riley, MD;  Location: [facility];  Service: General;  Laterality: N/A;   INCISIONAL HERNIA REPAIR N/A 05/19/2020   Procedure: OPEN HERNIA REPAIR INCISIONAL;  Surgeon: Coralie Keens, MD;  Location: [facility];  Service: General;  Laterality: N/A;   LAPAROSCOPIC BILATERAL SALPINGECTOMY Bilateral 10/07/2012   Procedure: LAPAROSCOPIC BILATERAL SALPINGECTOMY;  Surgeon: Marvene Staff, MD;  Location: [facility];  Service: Gynecology;  Laterality: Bilateral;   LAPAROSCOPIC GASTRIC SLEEVE RESECTION N/A 05/03/2021   Procedure: LAPAROSCOPIC GASTRIC SLEEVE RESECTION;  Surgeon: Clovis Riley, MD;  Location: [facility];  Service: General;  Laterality: N/A;   PARTIAL KNEE ARTHROPLASTY Left    ROBOTIC ASSISTED LAPAROSCOPIC LYSIS OF ADHESION N/A 10/07/2012   Procedure: ROBOTIC ASSISTED LAPAROSCOPIC LYSIS OF ADHESION;  Surgeon: Marvene Staff, MD;  Location: [facility];  Service: Gynecology;  Laterality: N/A;   ROBOTIC ASSISTED TOTAL HYSTERECTOMY N/A 10/07/2012   Procedure: ROBOTIC ASSISTED TOTAL HYSTERECTOMY;  Surgeon: Marvene Staff, MD;  Location: [facility];  Service: Gynecology;  Laterality: N/A;   UPPER GI ENDOSCOPY N/A 05/03/2021   Procedure: UPPER GI ENDOSCOPY;  Surgeon: Clovis Riley, MD;  Location: [facility];  Service: General;  Laterality: N/A;    OB History   No obstetric history on file.      Home Medications    Prior to Admission medications   Medication Sig Start Date End Date Taking? Authorizing Provider  amLODipine (NORVASC) 10 MG tablet Take 10 mg by mouth daily.    [provider]  gabapentin (NEURONTIN) 100 MG capsule Take 2 capsules (200 mg total) by mouth every 12 (twelve) hours. 05/04/21   Clovis Riley, MD  ondansetron (ZOFRAN-ODT) 4 MG disintegrating tablet Take 1 tablet (4 mg total) by mouth every 6 (six) hours as needed for nausea or vomiting. 05/04/21   Clovis Riley,  MD  pantoprazole (PROTONIX) 40 MG tablet Take 1 tablet (40 mg total) by mouth daily. 05/04/21   Clovis Riley, MD  traMADol (ULTRAM) 50 MG tablet Take 1 tablet (50 mg total) by mouth every 6 (six) hours as needed (pain). 05/04/21   Clovis Riley, MD    Family History Family History  Family history unknown: Yes    Social History Social History   Tobacco Use   Smoking status: Never   Smokeless tobacco: Never  Vaping Use   Vaping Use: Never used  Substance Use Topics    Alcohol use: No   Drug use: Never     Allergies   Aspirin, Codeine, Cyclobenzaprine, Ibuprofen, Penicillins, and Latex   Review of Systems Review of Systems  Constitutional:  Negative for chills and fever.  HENT:  Positive for congestion. Negative for ear pain and sore throat.   Eyes:  Negative for discharge and redness.  Respiratory:  Positive for cough. Negative for shortness of breath and wheezing.   Gastrointestinal:  Negative for abdominal pain, diarrhea, nausea and vomiting.     Physical Exam Triage Vital Signs ED Triage Vitals  Enc Vitals Group     BP 02/23/22 1100 135/84     Pulse Rate 02/23/22 1100 99     Resp 02/23/22 1100 17     Temp 02/23/22 1100 98.6 F (37 C)     Temp Source 02/23/22 1100 Oral     SpO2 02/23/22 1100 95 %     Weight --      Height --      Head Circumference --      Peak Flow --      Pain Score 02/23/22 1059 7     Pain Loc --      Pain Edu? --      Excl. in [place]? --    No data found.  Updated Vital Signs BP 135/84 (BP Location: Left Arm)   Pulse 99   Temp 98.6 F (37 C) (Oral)   Resp 17   LMP 10/01/2012   SpO2 95%      Physical Exam Vitals and nursing note reviewed.  Constitutional:      General: She is not in acute distress.    Appearance: Normal appearance. She is not ill-appearing.  HENT:     Head: Normocephalic and atraumatic.     Nose: Congestion present.     Mouth/Throat:     Mouth: Mucous membranes are moist.     Pharynx: No oropharyngeal exudate or posterior oropharyngeal erythema.  Eyes:     Conjunctiva/sclera: Conjunctivae normal.  Cardiovascular:     Rate and Rhythm: Normal rate and regular rhythm.     Heart sounds: Normal heart sounds. No murmur heard. Pulmonary:     Effort: Pulmonary effort is normal. No respiratory distress.     Breath sounds: Normal breath sounds. No wheezing, rhonchi or rales.  Skin:    General: Skin is warm and dry.  Neurological:     Mental Status: She is alert.  Psychiatric:         Mood and Affect: Mood normal.        Thought Content: Thought content normal.      UC Treatments / Results  Labs (all labs ordered are listed, but only abnormal results are displayed) Labs Reviewed  RESP PANEL BY RT-PCR (FLU A&B, COVID) [id number]    EKG   Radiology No results found.  Procedures Procedures (including critical care time)  Medications  Ordered in UC Medications  ketorolac (TORADOL) 30 MG/ML injection 30 mg (30 mg Intramuscular Given 02/23/22 1147)    Initial Impression / Assessment and Plan / UC Course  I have reviewed the triage vital signs and the nursing notes.  Pertinent labs & imaging results that were available during my care of the patient were reviewed by me and considered in my medical decision making (see chart for details).    Toradol injection administered in office for headache at patient's request. Will treat with antibiotic to cover possible sinusitis. Encouraged follow up if no gradual improvement or with any further concerns.   Final Clinical Impressions(s) / UC Diagnoses   Final diagnoses:  Acute non-recurrent maxillary sinusitis  Encounter for screening for COVID-19   Discharge Instructions   None    ED Prescriptions   None    PDMP not reviewed this encounter.   Francene Finders, PA-C 02/23/22 1428

## 2022-02-23 NOTE — ED Triage Notes (Signed)
Pt presents with ongoing sinus headache, congestion, and post nasal drainage X 2 days.

## 2022-02-24 ENCOUNTER — Telehealth (HOSPITAL_COMMUNITY): Payer: Self-pay | Admitting: Emergency Medicine

## 2022-02-24 MED ORDER — OSELTAMIVIR PHOSPHATE 75 MG PO CAPS: 75.0000 mg | ORAL_CAPSULE | Freq: Two times a day (BID) | ORAL | 0 refills | Status: DC

## 2022-02-27 ENCOUNTER — Telehealth: Payer: Self-pay

## 2022-02-27 ENCOUNTER — Ambulatory Visit (INDEPENDENT_AMBULATORY_CARE_PROVIDER_SITE_OTHER): Payer: BC Managed Care – PPO

## 2022-02-27 ENCOUNTER — Ambulatory Visit
Admission: EM | Admit: 2022-02-27 | Discharge: 2022-02-27 | Disposition: A | Discharge status: Home / Self Care | Payer: BC Managed Care – PPO | Attending: Physician Assistant | Admitting: Physician Assistant

## 2022-02-27 DIAGNOSIS — R062 Wheezing: Secondary | ICD-10-CM

## 2022-02-27 DIAGNOSIS — R059 Cough, unspecified: Secondary | ICD-10-CM | POA: Diagnosis not present

## 2022-02-27 DIAGNOSIS — J45901 Unspecified asthma with (acute) exacerbation: Secondary | ICD-10-CM

## 2022-02-27 DIAGNOSIS — J101 Influenza due to other identified influenza virus with other respiratory manifestations: Secondary | ICD-10-CM | POA: Diagnosis not present

## 2022-02-27 MED ORDER — ALBUTEROL SULFATE HFA 108 (90 BASE) MCG/ACT IN AERS: 1.0000 | INHALATION_SPRAY | Freq: Four times a day (QID) | RESPIRATORY_TRACT | 0 refills | Status: DC | PRN

## 2022-02-27 NOTE — ED Triage Notes (Signed)
Pt presents for chest xray s/p cough not getting better per phone recommendation

## 2022-02-27 NOTE — Telephone Encounter (Signed)
Pt called requesting Rx for cough medicine. Spoke with Wells Guiles PA recommend return to clinic for chest x-ray. RN contacted pt and relayed information, states she will try to come tomorrow morning.

## 2022-02-27 NOTE — ED Provider Notes (Signed)
[facility]    CSN: [id number] Arrival date & time: 02/27/22  1805      History   Chief Complaint Chief Complaint  Patient presents with   Cough    HPI [name] is a 61 y.o. female.   Patient here today for evaluation of cough that is continued after being diagnosed with flu several days ago.  She has had symptoms now for 6 days.  She denies fever.  She states that cough is productive at times.  She does have history of asthma but states she has not needed treatment for same in years.  She does note some wheezing.  She has taken multiple over-the-counter medications with mild relief but no resolution of cough.  The history is provided by the patient.  Cough Associated symptoms: wheezing   Associated symptoms: no chills, no ear pain, no eye discharge, no fever and no shortness of breath     Past Medical History:  Diagnosis Date   Hypertension    followed by pcp   Incisional hernia    OA (osteoarthritis)    Pre-diabetes     Patient Active Problem List   Diagnosis Date Noted   Morbid obesity ([place]) 05/03/2021   Primary osteoarthritis of left knee 04/20/2014   Obesity (BMI 30-39.9) 02/06/2014   Anemia 08/08/2013   Asthma 08/08/2013   Bilateral knee pain 08/08/2013   Right foot pain 08/08/2013   Essential hypertension 06/23/2013   S/P hysterectomy - Davinci  10/07/2012   Menorrhagia with fibroids 10/07/2012    Past Surgical History:  Procedure Laterality Date   BUNIONECTOMY Right 2020   great toe bunionectomy/  3rd toe pinning of fracture   CARPAL TUNNEL RELEASE Right [place] N/A 05/03/2021   Procedure: HERNIA REPAIR HIATAL;  Surgeon: Clovis Riley, MD;  Location: [facility];  Service: General;  Laterality: N/A;   INCISIONAL HERNIA REPAIR N/A 05/19/2020   Procedure: OPEN HERNIA REPAIR INCISIONAL;  Surgeon: Coralie Keens, MD;  Location: [facility];  Service: General;  Laterality: N/A;    LAPAROSCOPIC BILATERAL SALPINGECTOMY Bilateral 10/07/2012   Procedure: LAPAROSCOPIC BILATERAL SALPINGECTOMY;  Surgeon: Marvene Staff, MD;  Location: [facility];  Service: Gynecology;  Laterality: Bilateral;   LAPAROSCOPIC GASTRIC SLEEVE RESECTION N/A 05/03/2021   Procedure: LAPAROSCOPIC GASTRIC SLEEVE RESECTION;  Surgeon: Clovis Riley, MD;  Location[facility];  Service: General;  Laterality: N/A;   PARTIAL KNEE ARTHROPLASTY Left    ROBOTIC ASSISTED LAPAROSCOPIC LYSIS OF ADHESION N/A 10/07/2012   Procedure: ROBOTIC ASSISTED LAPAROSCOPIC LYSIS OF ADHESION;  Surgeon: Marvene Staff, MD;  Location: [facility];  Service: Gynecology;  Laterality: N/A;   ROBOTIC ASSISTED TOTAL HYSTERECTOMY N/A 10/07/2012   Procedure: ROBOTIC ASSISTED TOTAL HYSTERECTOMY;  Surgeon: Marvene Staff, MD;  Location: [facility];  Service: Gynecology;  Laterality: N/A;   UPPER GI ENDOSCOPY N/A 05/03/2021   Procedure: UPPER GI ENDOSCOPY;  Surgeon: Clovis Riley, MD;  Location: [facility];  Service: General;  Laterality: N/A;    OB History   No obstetric history on file.      Home Medications    Prior to Admission medications   Medication Sig Start Date End Date Taking? Authorizing Provider  albuterol (VENTOLIN HFA) 108 (90 Base) MCG/ACT inhaler Inhale 1-2 puffs into the lungs every 6 (six) hours as needed for wheezing or shortness of breath. 02/27/22  Yes Francene Finders, PA-C  amLODipine (NORVASC) 10 MG  tablet Take 10 mg by mouth daily.    [provider]  gabapentin (NEURONTIN) 100 MG capsule Take 2 capsules (200 mg total) by mouth every 12 (twelve) hours. 05/04/21   Clovis Riley, MD  ondansetron (ZOFRAN-ODT) 4 MG disintegrating tablet Take 1 tablet (4 mg total) by mouth every 6 (six) hours as needed for nausea or vomiting. 05/04/21   Clovis Riley, MD  oseltamivir (TAMIFLU) 75 MG capsule Take 1 capsule (75 mg total) by mouth every 12 (twelve) hours. 02/24/22   Chase Picket, MD   pantoprazole (PROTONIX) 40 MG tablet Take 1 tablet (40 mg total) by mouth daily. 05/04/21   Clovis Riley, MD  traMADol (ULTRAM) 50 MG tablet Take 1 tablet (50 mg total) by mouth every 6 (six) hours as needed (pain). 05/04/21   Clovis Riley, MD    Family History Family History  Family history unknown: Yes    Social History Social History   Tobacco Use   Smoking status: Never   Smokeless tobacco: Never  Vaping Use   Vaping Use: Never used  Substance Use Topics   Alcohol use: No   Drug use: Never     Allergies   Aspirin, Codeine, Cyclobenzaprine, Ibuprofen, Penicillins, and Latex   Review of Systems Review of Systems  Constitutional:  Negative for chills and fever.  HENT:  Positive for congestion. Negative for ear pain.   Eyes:  Negative for discharge and redness.  Respiratory:  Positive for cough and wheezing. Negative for shortness of breath.   Gastrointestinal:  Negative for abdominal pain, diarrhea, nausea and vomiting.     Physical Exam Triage Vital Signs ED Triage Vitals  Enc Vitals Group     BP 02/27/22 1841 131/60     Pulse Rate 02/27/22 1841 80     Resp 02/27/22 1841 16     Temp 02/27/22 1841 98.5 F (36.9 C)     Temp Source 02/27/22 1841 Oral     SpO2 02/27/22 1841 96 %     Weight --      Height --      Head Circumference --      Peak Flow --      Pain Score 02/27/22 1842 6     Pain Loc --      Pain Edu? --      Excl. in [place]? --    No data found.  Updated Vital Signs BP 131/60 (BP Location: Left Arm)   Pulse 80   Temp 98.5 F (36.9 C) (Oral)   Resp 16   LMP 10/01/2012   SpO2 96%   Visual Acuity Right Eye Distance:   Left Eye Distance:   Bilateral Distance:    Right Eye Near:   Left Eye Near:    Bilateral Near:     Physical Exam Vitals and nursing note reviewed.  Constitutional:      General: She is not in acute distress.    Appearance: Normal appearance. She is not ill-appearing.  HENT:     Head: Normocephalic and  atraumatic.     Nose: Congestion present.     Mouth/Throat:     Mouth: Mucous membranes are moist.     Pharynx: No oropharyngeal exudate or posterior oropharyngeal erythema.  Eyes:     Conjunctiva/sclera: Conjunctivae normal.  Cardiovascular:     Rate and Rhythm: Normal rate and regular rhythm.     Heart sounds: Normal heart sounds. No murmur heard. Pulmonary:  Effort: Pulmonary effort is normal. No respiratory distress.     Breath sounds: Wheezing (rare scattered wheeze) present. No rhonchi or rales.  Skin:    General: Skin is warm and dry.  Neurological:     Mental Status: She is alert.  Psychiatric:        Mood and Affect: Mood normal.        Thought Content: Thought content normal.      UC Treatments / Results  Labs (all labs ordered are listed, but only abnormal results are displayed) Labs Reviewed - No data to display  EKG   Radiology DG Chest 2 View  Result Date: 02/27/2022 CLINICAL DATA:  Cough, flu. EXAM: CHEST - 2 VIEW COMPARISON:  Chest x-ray 10/08/2011 FINDINGS: The heart size and mediastinal contours are within normal limits. Both lungs are clear. The visualized skeletal structures are unremarkable. There surgical clips in the left abdomen. IMPRESSION: No active cardiopulmonary disease. Electronically Signed   By: Ronney Asters M.D.   On: 02/27/2022 19:24    Procedures Procedures (including critical care time)  Medications Ordered in UC Medications - No data to display  Initial Impression / Assessment and Plan / UC Course  I have reviewed the triage vital signs and the nursing notes.  Pertinent labs & imaging results that were available during my care of the patient were reviewed by me and considered in my medical decision making (see chart for details).    Chest x-ray ordered to rule out pneumonia secondary to influenza.  Chest x-ray was negative.  Suspect likely mild asthma exacerbation and will treat with albuterol.  Discussed option for treatment  with oral steroids and patient would like to defer this if possible.  Encouraged follow-up if no improvement with albuterol or with any further concerns.  Final Clinical Impressions(s) / UC Diagnoses   Final diagnoses:  Influenza A  Wheezing  Mild asthma with acute exacerbation, unspecified whether persistent   Discharge Instructions   None    ED Prescriptions     Medication Sig Dispense Auth. Provider   albuterol (VENTOLIN HFA) 108 (90 Base) MCG/ACT inhaler Inhale 1-2 puffs into the lungs every 6 (six) hours as needed for wheezing or shortness of breath. 8 g Francene Finders, PA-C      PDMP not reviewed this encounter.   Francene Finders, PA-C 02/27/22 1944

## 2022-03-15 ENCOUNTER — Ambulatory Visit
Admission: RE | Admit: 2022-03-15 | Discharge: 2022-03-15 | Disposition: A | Discharge status: Home / Self Care | Payer: BC Managed Care – PPO | Source: Ambulatory Visit | Attending: Emergency Medicine | Admitting: Emergency Medicine

## 2022-03-15 VITALS — BP 148/80 | HR 82 | Temp 98.2°F | Resp 18 | Ht 67.0 in

## 2022-03-15 DIAGNOSIS — J0110 Acute frontal sinusitis, unspecified: Secondary | ICD-10-CM

## 2022-03-15 DIAGNOSIS — J209 Acute bronchitis, unspecified: Secondary | ICD-10-CM | POA: Diagnosis not present

## 2022-03-15 MED ORDER — AZITHROMYCIN 250 MG PO TABS: 250.0000 mg | ORAL_TABLET | Freq: Every day | ORAL | 0 refills | Status: DC

## 2022-03-15 NOTE — Discharge Instructions (Addendum)
Take the Zithromax as directed.  Follow up with your primary care provider if your symptoms are not improving.    

## 2022-03-15 NOTE — ED Triage Notes (Addendum)
Patient to Urgent Care with complaints of headache, chills, and cough x3 weeks. Reports bad headache and discolored sputum with productive cough  Previously flu+ (11/16). Reports she was seen by her doctor on Monday 11/30, diagnosed with bronchitis and prescribed prednisone with no relief in symptoms. Has also been taking mucinex.   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

## 2022-03-15 NOTE — ED Provider Notes (Signed)
Roderic Palau    CSN: [id number] Arrival date & time: 03/15/22  1821      History   Chief Complaint Chief Complaint  Patient presents with   Headache   Chills   Cough    HPI [name] is a 61 y.o. female.  Patient presents with chills, headache, congestion, cough x 3 weeks.  She denies fever, chest pain, shortness of breath, vomiting, diarrhea, or other symptoms.  Patient was seen at [facility] on 02/23/2022; diagnosed with acute sinusitis; positive for Influenza A.  She was seen at [facility] again on 02/27/2022; diagnosed with Influenza A; CXR negative; treated with albuterol inhaler; patient declined oral steroid. She was seen by her PCP on 03/09/2022; diagnosed with acute bronchitis; treated with prednisone and albuterol inhaler.  Patient's medical history includes asthma, hypertension, prediabetes, morbid obesity.   The history is provided by the patient and medical records.    Past Medical History:  Diagnosis Date   Hypertension    followed by pcp   Incisional hernia    OA (osteoarthritis)    Pre-diabetes     Patient Active Problem List   Diagnosis Date Noted   Morbid obesity ([place]) 05/03/2021   Primary osteoarthritis of left knee 04/20/2014   Obesity (BMI 30-39.9) 02/06/2014   Anemia 08/08/2013   Asthma 08/08/2013   Bilateral knee pain 08/08/2013   Right foot pain 08/08/2013   Essential hypertension 06/23/2013   S/P hysterectomy - Davinci  10/07/2012   Menorrhagia with fibroids 10/07/2012    Past Surgical History:  Procedure Laterality Date   BUNIONECTOMY Right 2020   great toe bunionectomy/  3rd toe pinning of fracture   CARPAL TUNNEL RELEASE Right [place] N/A 05/03/2021   Procedure: HERNIA REPAIR HIATAL;  Surgeon: Clovis Riley, MD;  Location: [facility];  Service: General;  Laterality: N/A;   INCISIONAL HERNIA REPAIR N/A 05/19/2020   Procedure: OPEN HERNIA REPAIR INCISIONAL;  Surgeon: Coralie Keens, MD;  Location: [facility];  Service: General;  Laterality: N/A;   LAPAROSCOPIC BILATERAL SALPINGECTOMY Bilateral 10/07/2012   Procedure: LAPAROSCOPIC BILATERAL SALPINGECTOMY;  Surgeon: Marvene Staff, MD;  Location: [facility];  Service: Gynecology;  Laterality: Bilateral;   LAPAROSCOPIC GASTRIC SLEEVE RESECTION N/A 05/03/2021   Procedure: LAPAROSCOPIC GASTRIC SLEEVE RESECTION;  Surgeon: Clovis Riley, MD;  Location: [facility];  Service: General;  Laterality: N/A;   PARTIAL KNEE ARTHROPLASTY Left    ROBOTIC ASSISTED LAPAROSCOPIC LYSIS OF ADHESION N/A 10/07/2012   Procedure: ROBOTIC ASSISTED LAPAROSCOPIC LYSIS OF ADHESION;  Surgeon: Marvene Staff, MD;  Location: [facility];  Service: Gynecology;  Laterality: N/A;   ROBOTIC ASSISTED TOTAL HYSTERECTOMY N/A 10/07/2012   Procedure: ROBOTIC ASSISTED TOTAL HYSTERECTOMY;  Surgeon: Marvene Staff, MD;  Location: [facility];  Service: Gynecology;  Laterality: N/A;   UPPER GI ENDOSCOPY N/A 05/03/2021   Procedure: UPPER GI ENDOSCOPY;  Surgeon: Clovis Riley, MD;  Location: [facility];  Service: General;  Laterality: N/A;    OB History   No obstetric history on file.      Home Medications    Prior to Admission medications   Medication Sig Start Date End Date Taking? Authorizing Provider  azithromycin (ZITHROMAX) 250 MG tablet Take 1 tablet (250 mg total) by mouth daily. Take first 2 tablets together, then 1 every day until finished. 03/15/22  Yes Sharion Balloon, NP  albuterol (VENTOLIN HFA) 108 (90 Base) MCG/ACT inhaler  Inhale 1-2 puffs into the lungs every 6 (six) hours as needed for wheezing or shortness of breath. 02/27/22   Francene Finders, PA-C  amLODipine (NORVASC) 10 MG tablet Take 10 mg by mouth daily.    [provider]  gabapentin (NEURONTIN) 100 MG capsule Take 2 capsules (200 mg total) by mouth every 12 (twelve) hours. 05/04/21   Clovis Riley, MD  ondansetron (ZOFRAN-ODT) 4 MG disintegrating  tablet Take 1 tablet (4 mg total) by mouth every 6 (six) hours as needed for nausea or vomiting. 05/04/21   Clovis Riley, MD  oseltamivir (TAMIFLU) 75 MG capsule Take 1 capsule (75 mg total) by mouth every 12 (twelve) hours. 02/24/22   Chase Picket, MD  pantoprazole (PROTONIX) 40 MG tablet Take 1 tablet (40 mg total) by mouth daily. 05/04/21   Clovis Riley, MD  traMADol (ULTRAM) 50 MG tablet Take 1 tablet (50 mg total) by mouth every 6 (six) hours as needed (pain). 05/04/21   Clovis Riley, MD    Family History Family History  Family history unknown: Yes    Social History Social History   Tobacco Use   Smoking status: Never   Smokeless tobacco: Never  Vaping Use   Vaping Use: Never used  Substance Use Topics   Alcohol use: No   Drug use: Never     Allergies   Aspirin, Codeine, Cyclobenzaprine, Ibuprofen, Penicillins, and Latex   Review of Systems Review of Systems  Constitutional:  Positive for chills. Negative for fever.  HENT:  Positive for congestion. Negative for ear pain and sore throat.   Respiratory:  Positive for cough. Negative for shortness of breath.   Cardiovascular:  Negative for chest pain and palpitations.  Gastrointestinal:  Negative for diarrhea and vomiting.  Skin:  Negative for rash.  Neurological:  Positive for headaches.  All other systems reviewed and are negative.    Physical Exam Triage Vital Signs ED Triage Vitals  Enc Vitals Group     BP      Pulse      Resp      Temp      Temp src      SpO2      Weight      Height      Head Circumference      Peak Flow      Pain Score      Pain Loc      Pain Edu?      Excl. in [place]?    No data found.  Updated Vital Signs BP (!) 148/80   Pulse 82   Temp 98.2 F (36.8 C)   Resp 18   Ht '5\' 7"'$  (1.702 m)   LMP 10/01/2012   SpO2 97%   BMI 32.86 kg/m   Visual Acuity Right Eye Distance:   Left Eye Distance:   Bilateral Distance:    Right Eye Near:   Left Eye Near:     Bilateral Near:     Physical Exam Vitals and nursing note reviewed.  Constitutional:      General: She is not in acute distress.    Appearance: She is well-developed. She is not ill-appearing.  HENT:     Right Ear: Tympanic membrane normal.     Left Ear: Tympanic membrane normal.     Nose: Congestion present.     Mouth/Throat:     Mouth: Mucous membranes are moist.     Pharynx: Oropharynx is clear.  Cardiovascular:  Rate and Rhythm: Normal rate and regular rhythm.     Heart sounds: Normal heart sounds.  Pulmonary:     Effort: Pulmonary effort is normal. No respiratory distress.     Breath sounds: Normal breath sounds.  Musculoskeletal:     Cervical back: Neck supple.  Skin:    General: Skin is warm and dry.  Neurological:     Mental Status: She is alert.  Psychiatric:        Mood and Affect: Mood normal.        Behavior: Behavior normal.      UC Treatments / Results  Labs (all labs ordered are listed, but only abnormal results are displayed) Labs Reviewed - No data to display  EKG   Radiology No results found.  Procedures Procedures (including critical care time)  Medications Ordered in UC Medications - No data to display  Initial Impression / Assessment and Plan / UC Course  I have reviewed the triage vital signs and the nursing notes.  Pertinent labs & imaging results that were available during my care of the patient were reviewed by me and considered in my medical decision making (see chart for details).   Acute sinusitis, acute bronchitis.  Patient has been symptomatic for 3 weeks.  She has not improved with prednisone and albuterol.  Treating today with Zithromax.  Discussed symptomatic treatment including Tylenol.  Instructed patient to follow up with her PCP if her symptoms are not improving.  She agrees to plan of care.     Final Clinical Impressions(s) / UC Diagnoses   Final diagnoses:  Acute non-recurrent frontal sinusitis  Acute  bronchitis, unspecified organism     Discharge Instructions      Take the Zithromax as directed.  Follow up with your primary care provider if your symptoms are not improving.        ED Prescriptions     Medication Sig Dispense Auth. Provider   azithromycin (ZITHROMAX) 250 MG tablet Take 1 tablet (250 mg total) by mouth daily. Take first 2 tablets together, then 1 every day until finished. 6 tablet Sharion Balloon, NP      PDMP not reviewed this encounter.   Sharion Balloon, NP 03/15/22 1905

## 2022-05-02 ENCOUNTER — Encounter: Payer: Self-pay | Admitting: Skilled Nursing Facility1

## 2022-05-02 ENCOUNTER — Encounter: Payer: BC Managed Care – PPO | Attending: Surgery | Admitting: Skilled Nursing Facility1

## 2022-05-02 VITALS — Ht 67.0 in | Wt 204.0 lb

## 2022-05-02 DIAGNOSIS — E669 Obesity, unspecified: Secondary | ICD-10-CM | POA: Diagnosis not present

## 2022-05-02 NOTE — Progress Notes (Signed)
Follow-up visit:  Post-Operative sleeve Surgery  Anthropometrics  Surgery date: 05/03/2021 Surgery type: sleeve Start weight at [facility]: 265 Weight: 204 pounds  Body Composition Scale 09/22/2021 12/21/2021 05/02/2022  Weight  lbs 219.6 209.8 204  Total Body Fat  % 41.6 40.4 39.6     Visceral Fat '13 12 12  '$ Fat-Free Mass  % 58.3 59.5 60.3     Total Body Water  % 43.6 44.2 44.6     Muscle-Mass  lbs 31.9 31.6 31.6  BMI 34.1 32.5 31.6  Body Fat Displacement           Torso  lbs 56.5 52.5 50        Left Leg  lbs 11.3 10.5 10        Right Leg  lbs 11.3 10.5 10        Left Arm  lbs 5.6 5.2 5        Right Arm  lbs 5.6 5.2 5   Clinical  Medical hx: HTN, prediabetes Medications: see lis: taken off blood pressure medicines  Labs: A1C 6.0, vitamin D 15.5 Notable signs/symptoms: none reported  Any previous deficiencies? Yes: vitamin D  Pt states she feels pretty good at this point in her journey feeling really good with more energy. Pt states she can run now because her knee doe snot hurt as often. Pt states her plans are to be 180 pounds.  Pt states she was able to cut out fried foods.  Pt states she used to be cake and pie crazy but she has not had any cravings for those items. Pt states she loves vegetables.  Pt states she loves walking on the treadmill.   24 hr recall: Breakfast: boiled egg and polish sausage  Snack: Lunch: cereal Snack: sugar free popcicle Dinner: bojangles grilled chicken sandwich without the bread or steak of salad  Beverages: water   Fluid intake: 64 ounces  Medications: See List Supplementation: multi and calcium   Using straws: no Drinking while eating: no Having you been chewing well: yes Chewing/swallowing difficulties: no Changes in vision: no Changes to mood/headaches: no Hair loss/Cahnges to skin/Changes to nails: no Any difficulty focusing or concentrating: no Sweating: no Dizziness/Lightheaded: no Palpitations: no  Carbonated beverages:  no N/V/D/C/GAS: no Abdominal Pain: no Dumping syndrome: no  Recent physical activity:  6 days a week walking 60 minutes 3 miles  Progress Towards Goal(s):  In Progress Teaching method utilized: Visual & Auditory  Demonstrated degree of understanding via: Teach Back  Readiness Level: Action Barriers to learning/adherence to lifestyle change: none identified  Goals: Add in 2-3 days a week weight resistance  Do not eat fried food: continue Choose complex whole grains and carbohydrates: continue  Try some baked fish with seasoning   Teaching Method Utilized:  Visual Auditory Hands on  Demonstrated degree of understanding via:  Teach Back   Monitoring/Evaluation:  Dietary intake, exercise, and body weight. Please call or email with any future questions or concerns make a 6 month from now appt

## 2022-05-23 ENCOUNTER — Ambulatory Visit
Admission: EM | Admit: 2022-05-23 | Discharge: 2022-05-23 | Disposition: A | Discharge status: Home / Self Care | Payer: BC Managed Care – PPO | Attending: Family Medicine | Admitting: Family Medicine

## 2022-05-23 DIAGNOSIS — L03011 Cellulitis of right finger: Secondary | ICD-10-CM

## 2022-05-23 MED ORDER — CLINDAMYCIN HCL 300 MG PO CAPS: 300.0000 mg | ORAL_CAPSULE | Freq: Three times a day (TID) | ORAL | 0 refills | Status: AC

## 2022-05-23 NOTE — ED Provider Notes (Signed)
[facility]    CSN: [id number] Arrival date & time: 05/23/22  1121      History   Chief Complaint Chief Complaint  Patient presents with   Finger Infection     HPI [name] is a 62 y.o. female.   HPI here for several day history of pain and swelling in the end of her right fifth finger.  A day or 2 ago she heated up a needle and stuck it in there and got some pus out.  No fever or chills. She is allergic to penicillin which causes a rash   Past Medical History:  Diagnosis Date   Hypertension    followed by pcp   Incisional hernia    OA (osteoarthritis)    Pre-diabetes     Patient Active Problem List   Diagnosis Date Noted   Morbid obesity ([place]) 05/03/2021   Primary osteoarthritis of left knee 04/20/2014   Obesity (BMI 30-39.9) 02/06/2014   Anemia 08/08/2013   Asthma 08/08/2013   Bilateral knee pain 08/08/2013   Right foot pain 08/08/2013   Essential hypertension 06/23/2013   S/P hysterectomy - Davinci  10/07/2012   Menorrhagia with fibroids 10/07/2012    Past Surgical History:  Procedure Laterality Date   BUNIONECTOMY Right 2020   great toe bunionectomy/  3rd toe pinning of fracture   CARPAL TUNNEL RELEASE Right [place] N/A 05/03/2021   Procedure: HERNIA REPAIR HIATAL;  Surgeon: Clovis Riley, MD;  [facility]: [facility];  Service: General;  Laterality: N/A;   INCISIONAL HERNIA REPAIR N/A 05/19/2020   Procedure: OPEN HERNIA REPAIR INCISIONAL;  Surgeon: Coralie Keens, MD;  Location: [facility];  Service: General;  Laterality: N/A;   LAPAROSCOPIC BILATERAL SALPINGECTOMY Bilateral 10/07/2012   Procedure: LAPAROSCOPIC BILATERAL SALPINGECTOMY;  Surgeon: Marvene Staff, MD;  Location: [facility];  Service: Gynecology;  Laterality: Bilateral;   LAPAROSCOPIC GASTRIC SLEEVE RESECTION N/A 05/03/2021   Procedure: LAPAROSCOPIC GASTRIC SLEEVE RESECTION;  Surgeon: Clovis Riley, MD;   [facility]: [facility];  Service: General;  Laterality: N/A;   PARTIAL KNEE ARTHROPLASTY Left    ROBOTIC ASSISTED LAPAROSCOPIC LYSIS OF ADHESION N/A 10/07/2012   Procedure: ROBOTIC ASSISTED LAPAROSCOPIC LYSIS OF ADHESION;  Surgeon: Marvene Staff, MD;  Location: [facility];  Service: Gynecology;  Laterality: N/A;   ROBOTIC ASSISTED TOTAL HYSTERECTOMY N/A 10/07/2012   Procedure: ROBOTIC ASSISTED TOTAL HYSTERECTOMY;  Surgeon: Marvene Staff, MD;  Location: [facility];  Service: Gynecology;  Laterality: N/A;   UPPER GI ENDOSCOPY N/A 05/03/2021   Procedure: UPPER GI ENDOSCOPY;  Surgeon: Clovis Riley, MD;  Location: [facility];  Service: General;  Laterality: N/A;    OB History   No obstetric history on file.      Home Medications    Prior to Admission medications   Medication Sig Start Date End Date Taking? Authorizing Provider  clindamycin (CLEOCIN) 300 MG capsule Take 1 capsule (300 mg total) by mouth 3 (three) times daily for 7 days. 05/23/22 05/30/22 Yes Barrett Henle, MD  albuterol (VENTOLIN HFA) 108 (90 Base) MCG/ACT inhaler Inhale 1-2 puffs into the lungs every 6 (six) hours as needed for wheezing or shortness of breath. 02/27/22   Francene Finders, PA-C  amLODipine (NORVASC) 10 MG tablet Take 10 mg by mouth daily.    [provider]  gabapentin (NEURONTIN) 100 MG capsule Take 2 capsules (200 mg total) by mouth every 12 (twelve)  hours. 05/04/21   Clovis Riley, MD  pantoprazole (PROTONIX) 40 MG tablet Take 1 tablet (40 mg total) by mouth daily. 05/04/21   Clovis Riley, MD    Family History Family History  Family history unknown: Yes    Social History Social History   Tobacco Use   Smoking status: Never   Smokeless tobacco: Never  Vaping Use   Vaping Use: Never used  Substance Use Topics   Alcohol use: No   Drug use: Never     Allergies   Aspirin, Codeine, Cyclobenzaprine, Ibuprofen, Penicillins, and Latex   Review of Systems Review of  Systems   Physical Exam Triage Vital Signs ED Triage Vitals  Enc Vitals Group     BP 05/23/22 1146 (!) 171/111     Pulse Rate 05/23/22 1146 82     Resp 05/23/22 1146 17     Temp 05/23/22 1146 98.3 F (36.8 C)     Temp Source 05/23/22 1146 Oral     SpO2 05/23/22 1146 96 %     Weight --      Height --      Head Circumference --      Peak Flow --      Pain Score 05/23/22 1144 6     Pain Loc --      Pain Edu? --      Excl. in [place]? --    No data found.  Updated Vital Signs BP (!) 171/111 (BP Location: Left Arm)   Pulse 82   Temp 98.3 F (36.8 C) (Oral)   Resp 17   LMP 10/01/2012   SpO2 96%   Visual Acuity Right Eye Distance:   Left Eye Distance:   Bilateral Distance:    Right Eye Near:   Left Eye Near:    Bilateral Near:     Physical Exam Vitals reviewed.  Constitutional:      General: She is not in acute distress.    Appearance: She is not ill-appearing, toxic-appearing or diaphoretic.  HENT:     Mouth/Throat:     Mouth: Mucous membranes are moist.     Pharynx: No oropharyngeal exudate or posterior oropharyngeal erythema.  Eyes:     Extraocular Movements: Extraocular movements intact.     Conjunctiva/sclera: Conjunctivae normal.     Pupils: Pupils are equal, round, and reactive to light.  Cardiovascular:     Rate and Rhythm: Normal rate and regular rhythm.     Heart sounds: No murmur heard. Pulmonary:     Effort: Pulmonary effort is normal.     Breath sounds: Normal breath sounds.  Musculoskeletal:     Cervical back: Neck supple.  Lymphadenopathy:     Cervical: No cervical adenopathy.  Skin:    Coloration: Skin is not pale.     Comments: The distal portion of the right pinky finger is erythematous and swollen some.  There is no pointing area at this point.  The indurated area surrounds the entire nail fold on the pinky finger.  Neurological:     General: No focal deficit present.     Mental Status: She is alert and oriented to person, place, and time.   Psychiatric:        Behavior: Behavior normal.      UC Treatments / Results  Labs (all labs ordered are listed, but only abnormal results are displayed) Labs Reviewed - No data to display  EKG   Radiology No results found.  Procedures Procedures (including critical  care time)  Medications Ordered in UC Medications - No data to display  Initial Impression / Assessment and Plan / UC Course  I have reviewed the triage vital signs and the nursing notes.  Pertinent labs & imaging results that were available during my care of the patient were reviewed by me and considered in my medical decision making (see chart for details).        Clindamycin is sent to treat the cellulitis.  We discussed her using Tylenol for pain, and we discussed safe dosing of that medication. Final Clinical Impressions(s) / UC Diagnoses   Final diagnoses:  Cellulitis of finger of right hand     Discharge Instructions      --Take clindamycin 300 mg-- 1 capsule 3 times daily for 7 days  Tylenol 500 mg--it is safe to take 2 every 6 hours as needed for pain.  Warm compress or warm soak to the finger 2-3 times a day.      ED Prescriptions     Medication Sig Dispense Auth. Provider   clindamycin (CLEOCIN) 300 MG capsule Take 1 capsule (300 mg total) by mouth 3 (three) times daily for 7 days. 21 capsule Barrett Henle, MD      PDMP not reviewed this encounter.   Barrett Henle, MD 05/23/22 1159

## 2022-05-23 NOTE — ED Triage Notes (Signed)
Pt presents with infection in right little finger X 5 days.

## 2022-05-23 NOTE — Discharge Instructions (Signed)
--  Take clindamycin 300 mg-- 1 capsule 3 times daily for 7 days  Tylenol 500 mg--it is safe to take 2 every 6 hours as needed for pain.  Warm compress or warm soak to the finger 2-3 times a day.

## 2022-07-04 DIAGNOSIS — M170 Bilateral primary osteoarthritis of knee: Secondary | ICD-10-CM | POA: Insufficient documentation

## 2022-11-10 ENCOUNTER — Ambulatory Visit (HOSPITAL_BASED_OUTPATIENT_CLINIC_OR_DEPARTMENT_OTHER): Payer: BC Managed Care – PPO | Attending: Family Medicine | Admitting: Physical Therapy

## 2022-11-10 ENCOUNTER — Encounter (HOSPITAL_BASED_OUTPATIENT_CLINIC_OR_DEPARTMENT_OTHER): Payer: Self-pay | Admitting: Physical Therapy

## 2022-11-10 ENCOUNTER — Other Ambulatory Visit: Payer: Self-pay

## 2022-11-10 DIAGNOSIS — M1711 Unilateral primary osteoarthritis, right knee: Secondary | ICD-10-CM | POA: Insufficient documentation

## 2022-11-10 DIAGNOSIS — G8929 Other chronic pain: Secondary | ICD-10-CM | POA: Insufficient documentation

## 2022-11-10 DIAGNOSIS — R2689 Other abnormalities of gait and mobility: Secondary | ICD-10-CM

## 2022-11-10 DIAGNOSIS — M6281 Muscle weakness (generalized): Secondary | ICD-10-CM | POA: Diagnosis not present

## 2022-11-10 NOTE — Therapy (Signed)
OUTPATIENT PHYSICAL THERAPY LOWER EXTREMITY EVALUATION   Patient Name: [name] MRN: [id number] DOB:01/13/1961, 62 y.o., female Today's Date: 11/10/2022  END OF SESSION:  PT End of Session - 11/10/22 0953     Visit Number 1    Number of Visits 16    Date for PT Re-Evaluation 01/05/23    Authorization Type BCBS    Progress Note Due on Visit 10    PT Start Time 0905    PT Stop Time 0945    PT Time Calculation (min) 40 min    Activity Tolerance Patient tolerated treatment well;Patient limited by pain    Behavior During Therapy [facility] for tasks assessed/performed             Past Medical History:  Diagnosis Date   Hypertension    followed by pcp   Incisional hernia    OA (osteoarthritis)    Pre-diabetes    Past Surgical History:  Procedure Laterality Date   BUNIONECTOMY Right 2020   great toe bunionectomy/  3rd toe pinning of fracture   CARPAL TUNNEL RELEASE Right 1994   HERNIA REPAIR  1996   HIATAL HERNIA REPAIR N/A 05/03/2021   Procedure: HERNIA REPAIR HIATAL;  Surgeon: Berna Bue, MD;  Location: [facility];  Service: General;  Laterality: N/A;   INCISIONAL HERNIA REPAIR N/A 05/19/2020   Procedure: OPEN HERNIA REPAIR INCISIONAL;  Surgeon: Abigail Miyamoto, MD;  Location: [facility] [place];  Service: General;  Laterality: N/A;   LAPAROSCOPIC BILATERAL SALPINGECTOMY Bilateral 10/07/2012   Procedure: LAPAROSCOPIC BILATERAL SALPINGECTOMY;  Surgeon: Serita Kyle, MD;  Location: [facility];  Service: Gynecology;  Laterality: Bilateral;   LAPAROSCOPIC GASTRIC SLEEVE RESECTION N/A 05/03/2021   Procedure: LAPAROSCOPIC GASTRIC SLEEVE RESECTION;  Surgeon: Berna Bue, MD;  Location: [facility];  Service: General;  Laterality: N/A;   PARTIAL KNEE ARTHROPLASTY Left    ROBOTIC ASSISTED LAPAROSCOPIC LYSIS OF ADHESION N/A 10/07/2012   Procedure: ROBOTIC ASSISTED LAPAROSCOPIC LYSIS OF ADHESION;  Surgeon: Serita Kyle, MD;  Location: [facility];  Service:  Gynecology;  Laterality: N/A;   ROBOTIC ASSISTED TOTAL HYSTERECTOMY N/A 10/07/2012   Procedure: ROBOTIC ASSISTED TOTAL HYSTERECTOMY;  Surgeon: Serita Kyle, MD;  Location: [facility];  Service: Gynecology;  Laterality: N/A;   UPPER GI ENDOSCOPY N/A 05/03/2021   Procedure: UPPER GI ENDOSCOPY;  Surgeon: Berna Bue, MD;  Location: [facility];  Service: General;  Laterality: N/A;   Patient Active Problem List   Diagnosis Date Noted   Morbid obesity (HCC) 05/03/2021   Primary osteoarthritis of left knee 04/20/2014   Obesity (BMI 30-39.9) 02/06/2014   Anemia 08/08/2013   Asthma 08/08/2013   Bilateral knee pain 08/08/2013   Right foot pain 08/08/2013   Essential hypertension 06/23/2013   S/P hysterectomy - Davinci  10/07/2012   Menorrhagia with fibroids 10/07/2012    PCP: Leonard Downing   REFERRING PROVIDER: Lenora Boys, MD   REFERRING DIAG: M17.11 (ICD-10-CM) - Unilateral primary osteoarthritis, right knee   THERAPY DIAG:  Chronic pain of both knees  Muscle weakness (generalized)  Rationale for Evaluation and Treatment: Rehabilitation  ONSET DATE: exacerbation 3 months   SUBJECTIVE:   SUBJECTIVE STATEMENT: I had gastro surgery and have lost 85lbs.  Came here last year to get ready for the surgery.  Now I need to have 2 TKR's want to try to wait a year if I can.  Can't walk on treadmill anymore cause my knees hurt too much. Had a steroid  injection 1 month ago.  Can stand and walk ~20  mins.  PERTINENT HISTORY: Pt reports since weight loss no more HBP nore kidney dysfunction PAIN:  Are you having pain? Yes: NPRS scale: 1/10 Pain location: right knee (recent injection) Pain description: tingly, ache Aggravating factors: standing, walking, sitting still driving bus Relieving factors: resting, heating pads  PAIN:  Are you having pain? Yes: NPRS scale: 4-5/10 Pain location: Left knee Pain description: continuous ache/sore behind knee Aggravating factors:  walking/standing, walking too long, sitting still Relieving factors: resting   PRECAUTIONS: None  RED FLAGS: None   WEIGHT BEARING RESTRICTIONS: No  FALLS:  Has patient fallen in last 6 months? No  LIVING ENVIRONMENT: Lives with: lives alone Lives in: House/apartment Stairs: No Has following equipment at home: Grab bars  OCCUPATION: school bus driver  PLOF: Independent  PATIENT GOALS: decrease pain, be able to walk better, get more weight off.  NEXT MD VISIT: 3 months  OBJECTIVE:   DIAGNOSTIC FINDINGS: none in chart  PATIENT SURVEYS:  Primary score 48% with goal of 65%  COGNITION: Overall cognitive status: Within functional limits for tasks assessed     SENSATION: WFL    MUSCLE LENGTH: Hamstrings: full in seated knee extension   POSTURE: flexed trunk  and weight shift right  PALPATION: Tenderness Right patellar mobilizations  LOWER EXTREMITY ROM:  Active ROM Right eval Left eval  Hip flexion    Hip extension    Hip abduction    Hip adduction    Hip internal rotation    Hip external rotation    Knee flexion 90 114  Knee extension 0 Arom -10/ passive 0  Ankle dorsiflexion    Ankle plantarflexion    Ankle inversion    Ankle eversion     (Blank rows = not tested)  LOWER EXTREMITY MMT:  MMT Right eval Left eval  Hip flexion 28.8 53.0  Hip extension    Hip abduction 35.5 46.1  Hip adduction    Hip internal rotation    Hip external rotation    Knee flexion    Knee extension 31.7 30.2  Ankle dorsiflexion    Ankle plantarflexion    Ankle inversion    Ankle eversion     (Blank rows = not tested)  LOWER EXTREMITY SPECIAL TESTS:  Knee special tests: Patellafemoral grind test: positive  bilatterally  FUNCTIONAL TESTS:  30 s STS: 3 using UE Timed up and go (TUG): 15.38 4 Stage balance passed NBOS, semi tandem and tandem. SLS x 2 s  GAIT: Distance walked: 500 ft Assistive device utilized: None Level of assistance: Complete  Independence Comments: Off loading right, slowed cadence/antalgic, right knee valgus deformity.   TODAY'S TREATMENT:                                                                                                                              Evaluation Functional testing Foto edu    PATIENT EDUCATION:  Education details: Discussed eval findings, rehab rationale, aquatic program progression/POC and pools in area. Patient is in agreement   Person educated: Patient Education method: Explanation Education comprehension: verbalized understanding  HOME EXERCISE PROGRAM: TBA  ASSESSMENT:  CLINICAL IMPRESSION: Patient is a 62 y.o. F who was seen today for physical therapy evaluation and treatment for Right (bilateral) knee pain. She has recently lost 85lb and qualifies for the surgical intervention but is wanting to delay it if able until next summer.  She has had a partial knee replacement in left in past and presently Left knee pain >right.  She had a steroid injection in the right recently. She presents amb without an AD offloading to right.  Valgus deformity right knee present. Functional testing demonstrates a decrease lower extremity strength, transitional movements, and balance. She will benefit from skilled physical therapy intervention initially in aquatic setting using the properties of water to regain strength and movement while decreasing pain then transition to land based for progressive strengthening to maximize function and mobility.  OBJECTIVE IMPAIRMENTS: Abnormal gait, decreased balance, decreased mobility, difficulty walking, decreased ROM, decreased strength, obesity, and pain.   ACTIVITY LIMITATIONS: lifting, sitting, standing, squatting, stairs, and locomotion level  PERSONAL FACTORS: Past/current experiences, Time since onset of injury/illness/exacerbation, and 1 comorbidity: obesity  are also affecting patient's functional outcome.   REHAB POTENTIAL: Good  CLINICAL  DECISION MAKING: Evolving/moderate complexity  EVALUATION COMPLEXITY: Moderate   GOALS: Goals reviewed with patient? Yes  SHORT TERM GOALS: Target date: 12/08/22 Pt will tolerate full aquatic sessions consistently without increase in pain and with improving function to demonstrate good toleration and effectiveness of intervention.  Baseline: Goal status: INITIAL  2.  Pt will improve on Tug test to <or= 12s to demonstrate improvement in lower extremity function, mobility and decreased fall risk. Baseline: 15.38 Goal status: INITIAL  3.  Pt will report decrease in pain to <2/10 left knee for improved toleration to activity/quality of life and to demonstrate improved management of pain.  Baseline: see chart Goal status: INITIAL  4.  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  Pt will be indep with final HEP's (land and aquatic as appropriate) for continued management of condition Baseline:  Goal status: INITIAL  3.  Pt will improve strength in all areas listed by 10lb or > to demonstrate improved overall physical function Baseline: see chart Goal status: INITIAL  4.  Pt will improve on 30s STS test to <or= 7 to demonstrate improving functional lower extremity strength, transitional movements, and balance Baseline: 3 Goal status: INITIAL  5.  Pt will report toleration to amb improved to up towards 1 hour without limitation of knee pain Baseline:  Goal status: INITIAL  6.  Pt will improve ROM right knee flex by at least 10 degrees to improve function Baseline: see chart Goal status: INITIAL   PLAN:  PT FREQUENCY: 1-2x/week  PT DURATION: 8 weeks  PLANNED INTERVENTIONS: Therapeutic exercises, Therapeutic activity,  Neuromuscular re-education, Balance training, Gait training, Patient/Family education, Self Care, Joint mobilization, Joint manipulation, Stair training, Orthotic/Fit training, DME instructions, Aquatic Therapy, Electrical stimulation, Cryotherapy, Moist heat, Taping, Ionotophoresis 4mg /ml Dexamethasone, Manual therapy, and Re-evaluation  PLAN FOR NEXT SESSION: Aquatic: strengthening and ROM, balance retraining, gait.  Land: progressive  strengthening, stair climbing, STS transfers, Gait (w/without AD), HEP   Tamika Shropshire (Frankie) Trilby [address] MPT 11/10/2022, 10:14 AM

## 2022-11-16 ENCOUNTER — Telehealth (HOSPITAL_BASED_OUTPATIENT_CLINIC_OR_DEPARTMENT_OTHER): Payer: Self-pay | Admitting: Physical Therapy

## 2022-11-16 ENCOUNTER — Ambulatory Visit (HOSPITAL_BASED_OUTPATIENT_CLINIC_OR_DEPARTMENT_OTHER): Payer: BC Managed Care – PPO | Admitting: Physical Therapy

## 2022-11-16 NOTE — Telephone Encounter (Signed)
Patient did not show for aquatic therapy appointment.  Called and spoke with patient and she stated that she thought her appointment was for 8/9, not today.  Confirmed next upcoming appointment with her.   Mayer Camel, PTA 11/16/22 9:38 AM [facility] [address] [place], [place], [address] Phone: [phone]   Fax:  [phone]

## 2022-11-20 ENCOUNTER — Encounter (HOSPITAL_BASED_OUTPATIENT_CLINIC_OR_DEPARTMENT_OTHER): Payer: Self-pay | Admitting: Physical Therapy

## 2022-11-20 ENCOUNTER — Ambulatory Visit (HOSPITAL_BASED_OUTPATIENT_CLINIC_OR_DEPARTMENT_OTHER): Payer: BC Managed Care – PPO | Admitting: Physical Therapy

## 2022-11-20 DIAGNOSIS — M6281 Muscle weakness (generalized): Secondary | ICD-10-CM

## 2022-11-20 DIAGNOSIS — M1711 Unilateral primary osteoarthritis, right knee: Secondary | ICD-10-CM | POA: Diagnosis not present

## 2022-11-20 DIAGNOSIS — R2689 Other abnormalities of gait and mobility: Secondary | ICD-10-CM

## 2022-11-20 DIAGNOSIS — G8929 Other chronic pain: Secondary | ICD-10-CM

## 2022-11-20 NOTE — Therapy (Signed)
OUTPATIENT PHYSICAL THERAPY LOWER EXTREMITY EVALUATION   Patient Name: [name] MRN: [id number] DOB:1961/04/08, 62 y.o., female Today's Date: 11/20/2022  END OF SESSION:  PT End of Session - 11/20/22 0921     Visit Number 2    Number of Visits 16    Date for PT Re-Evaluation 01/05/23    Authorization Type BCBS    Progress Note Due on Visit 10    PT Start Time 0908    PT Stop Time 0946    PT Time Calculation (min) 38 min    Activity Tolerance Patient tolerated treatment well;Patient limited by pain    Behavior During Therapy [facility] for tasks assessed/performed             Past Medical History:  Diagnosis Date   Hypertension    followed by pcp   Incisional hernia    OA (osteoarthritis)    Pre-diabetes    Past Surgical History:  Procedure Laterality Date   BUNIONECTOMY Right 2020   great toe bunionectomy/  3rd toe pinning of fracture   CARPAL TUNNEL RELEASE Right 1994   HERNIA REPAIR  1996   HIATAL HERNIA REPAIR N/A 05/03/2021   Procedure: HERNIA REPAIR HIATAL;  Surgeon: Berna Bue, MD;  Location: [facility];  Service: General;  Laterality: N/A;   INCISIONAL HERNIA REPAIR N/A 05/19/2020   Procedure: OPEN HERNIA REPAIR INCISIONAL;  Surgeon: Abigail Miyamoto, MD;  Location: [facility] [place];  Service: General;  Laterality: N/A;   LAPAROSCOPIC BILATERAL SALPINGECTOMY Bilateral 10/07/2012   Procedure: LAPAROSCOPIC BILATERAL SALPINGECTOMY;  Surgeon: Serita Kyle, MD;  Location: [facility];  Service: Gynecology;  Laterality: Bilateral;   LAPAROSCOPIC GASTRIC SLEEVE RESECTION N/A 05/03/2021   Procedure: LAPAROSCOPIC GASTRIC SLEEVE RESECTION;  Surgeon: Berna Bue, MD;  Location: [facility];  Service: General;  Laterality: N/A;   PARTIAL KNEE ARTHROPLASTY Left    ROBOTIC ASSISTED LAPAROSCOPIC LYSIS OF ADHESION N/A 10/07/2012   Procedure: ROBOTIC ASSISTED LAPAROSCOPIC LYSIS OF ADHESION;  Surgeon: Serita Kyle, MD;  Location: [facility];  Service:  Gynecology;  Laterality: N/A;   ROBOTIC ASSISTED TOTAL HYSTERECTOMY N/A 10/07/2012   Procedure: ROBOTIC ASSISTED TOTAL HYSTERECTOMY;  Surgeon: Serita Kyle, MD;  Location: [facility];  Service: Gynecology;  Laterality: N/A;   UPPER GI ENDOSCOPY N/A 05/03/2021   Procedure: UPPER GI ENDOSCOPY;  Surgeon: Berna Bue, MD;  Location: [facility];  Service: General;  Laterality: N/A;   Patient Active Problem List   Diagnosis Date Noted   Morbid obesity (HCC) 05/03/2021   Primary osteoarthritis of left knee 04/20/2014   Obesity (BMI 30-39.9) 02/06/2014   Anemia 08/08/2013   Asthma 08/08/2013   Bilateral knee pain 08/08/2013   Right foot pain 08/08/2013   Essential hypertension 06/23/2013   S/P hysterectomy - Davinci  10/07/2012   Menorrhagia with fibroids 10/07/2012    PCP: Leonard Downing   REFERRING PROVIDER: Lenora Boys, MD   REFERRING DIAG: M17.11 (ICD-10-CM) - Unilateral primary osteoarthritis, right knee   THERAPY DIAG:  Chronic pain of both knees  Muscle weakness (generalized)  Other abnormalities of gait and mobility  Rationale for Evaluation and Treatment: Rehabilitation  ONSET DATE: exacerbation 3 months   SUBJECTIVE:   SUBJECTIVE STATEMENT: I am hurting today  Initial subjective I had gastro surgery and have lost 85lbs.  Came here last year to get ready for the surgery.  Now I need to have 2 TKR's want to try to wait a year if I can.  Can't walk on treadmill anymore cause my knees hurt too much. Had a steroid injection 1 month ago.  Can stand and walk ~20  mins.  PERTINENT HISTORY: Pt reports since weight loss no more HBP nore kidney dysfunction PAIN:  Are you having pain? Yes: NPRS scale: 4/10 Pain location: right knee (recent injection) Pain description: tingly, ache Aggravating factors: standing, walking, sitting still driving bus Relieving factors: resting, heating pads  PAIN:  Are you having pain? Yes: NPRS scale: 4-5/10 Pain location:  Left knee Pain description: continuous ache/sore behind knee Aggravating factors: walking/standing, walking too long, sitting still Relieving factors: resting   PRECAUTIONS: None  RED FLAGS: None   WEIGHT BEARING RESTRICTIONS: No  FALLS:  Has patient fallen in last 6 months? No  LIVING ENVIRONMENT: Lives with: lives alone Lives in: House/apartment Stairs: No Has following equipment at home: Grab bars  OCCUPATION: school bus driver  PLOF: Independent  PATIENT GOALS: decrease pain, be able to walk better, get more weight off.  NEXT MD VISIT: 3 months  OBJECTIVE:   DIAGNOSTIC FINDINGS: none in chart  PATIENT SURVEYS:  Primary score 48% with goal of 65%  COGNITION: Overall cognitive status: Within functional limits for tasks assessed     SENSATION: WFL    MUSCLE LENGTH: Hamstrings: full in seated knee extension   POSTURE: flexed trunk  and weight shift right  PALPATION: Tenderness Right patellar mobilizations  LOWER EXTREMITY ROM:  Active ROM Right eval Left eval  Hip flexion    Hip extension    Hip abduction    Hip adduction    Hip internal rotation    Hip external rotation    Knee flexion 90 114  Knee extension 0 Arom -10/ passive 0  Ankle dorsiflexion    Ankle plantarflexion    Ankle inversion    Ankle eversion     (Blank rows = not tested)  LOWER EXTREMITY MMT:  MMT Right eval Left eval  Hip flexion 28.8 53.0  Hip extension    Hip abduction 35.5 46.1  Hip adduction    Hip internal rotation    Hip external rotation    Knee flexion    Knee extension 31.7 30.2  Ankle dorsiflexion    Ankle plantarflexion    Ankle inversion    Ankle eversion     (Blank rows = not tested)  LOWER EXTREMITY SPECIAL TESTS:  Knee special tests: Patellafemoral grind test: positive  bilatterally  FUNCTIONAL TESTS:  30 s STS: 3 using UE Timed up and go (TUG): 15.38 4 Stage balance passed NBOS, semi tandem and tandem. SLS x 2 s  GAIT: Distance  walked: 500 ft Assistive device utilized: None Level of assistance: Complete Independence Comments: Off loading right, slowed cadence/antalgic, right knee valgus deformity.   TODAY'S TREATMENT:  Pt seen for aquatic therapy today.  Treatment took place in water 3.5-4.75 ft in depth at the [facility] pool. Temp of water was 91.  Pt entered/exited the pool via stairs using step to pattern with hand rail.  *walking forward, back and side stepping in 4.43ft ue support barbell multiple widths  - cues for knee flex, heel strike, toe off.  Gait improves with repetition. Standing Ue support on wall: df, pf; knee flex; hip add/abd;  hip extension; relaxed squats x 1-2 reps.  Pt completes exercises slow and guarded.  Cues for realxation *3 way stretch using noodle leaning against wall.  Min asst positioning noodle, hamstring, gastroc, add and it band stretch *Walking forward and back x 2 widths ea with improved knee flex; step length and heel strike.  Pt requires the buoyancy and hydrostatic pressure of water for support, and to offload joints by unweighting joint load by at least 50 % in navel deep water and by at least 75-80% in chest to neck deep water.  Viscosity of the water is needed for resistance of strengthening. Water current perturbations provides challenge to standing balance requiring increased core activation.     PATIENT EDUCATION:  Education details: Discussed eval findings, rehab rationale, aquatic program progression/POC and pools in area. Patient is in agreement   Person educated: Patient Education method: Explanation Education comprehension: verbalized understanding  HOME EXERCISE PROGRAM: TBA  ASSESSMENT:  CLINICAL IMPRESSION: Pt is well known and comfortable to this clinic and setting.  Indep submerged with therapist instructing from deck. She  presents today with increased pain sx bilaterally. She is guarded with all movements initially. Cues for knee flex as well as heel strike/toe off with amb. Pt tending towards foot flat. Session focused on gentle movement patterns and stretching.  Gait pattern greatly improved by end of session with reduction of pain left knee to 0/10, right 3/10. She is a good candidate for aquatic intervention and will benefit from the porpeteries of water to progress towards land based goals.  Initial assessment: Patient is a 62 y.o. F who was seen today for physical therapy evaluation and treatment for Right (bilateral) knee pain. She has recently lost 85lb and qualifies for the surgical intervention but is wanting to delay it if able until next summer.  She has had a partial knee replacement in left in past and presently Left knee pain >right.  She had a steroid injection in the right recently. She presents amb without an AD offloading to right.  Valgus deformity right knee present. Functional testing demonstrates a decrease lower extremity strength, transitional movements, and balance. She will benefit from skilled physical therapy intervention initially in aquatic setting using the properties of water to regain strength and movement while decreasing pain then transition to land based for progressive strengthening to maximize function and mobility.  OBJECTIVE IMPAIRMENTS: Abnormal gait, decreased balance, decreased mobility, difficulty walking, decreased ROM, decreased strength, obesity, and pain.   ACTIVITY LIMITATIONS: lifting, sitting, standing, squatting, stairs, and locomotion level  PERSONAL FACTORS: Past/current experiences, Time since onset of injury/illness/exacerbation, and 1 comorbidity: obesity  are also affecting patient's functional outcome.   REHAB POTENTIAL: Good  CLINICAL DECISION MAKING: Evolving/moderate complexity  EVALUATION COMPLEXITY: Moderate   GOALS: Goals reviewed with patient?  Yes  SHORT TERM GOALS: Target date: 12/08/22 Pt will tolerate full aquatic sessions consistently without increase in pain and with improving function to demonstrate good toleration and effectiveness of intervention.  Baseline: Goal status: INITIAL  2.  Pt  will improve on Tug test to <or= 12s to demonstrate improvement in lower extremity function, mobility and decreased fall risk. Baseline: 15.38 Goal status: INITIAL  3.  Pt will report decrease in pain to <2/10 left knee for improved toleration to activity/quality of life and to demonstrate improved management of pain.  Baseline: see chart Goal status: INITIAL  4.  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  Pt will be indep with final HEP's (land and aquatic as appropriate) for continued management of condition Baseline:  Goal status: INITIAL  3.  Pt will improve strength in all areas listed by 10lb or > to demonstrate improved overall physical function Baseline: see chart Goal status: INITIAL  4.  Pt will improve on 30s STS test to <or= 7 to demonstrate improving functional lower extremity strength, transitional movements, and balance Baseline: 3 Goal status: INITIAL  5.  Pt will report toleration to amb improved to up towards 1 hour without limitation of knee pain Baseline:  Goal status: INITIAL  6.  Pt will improve ROM right knee flex by at least 10 degrees to improve function Baseline: see chart Goal status: INITIAL   PLAN:  PT FREQUENCY: 1-2x/week  PT DURATION: 8 weeks  PLANNED INTERVENTIONS: Therapeutic exercises, Therapeutic activity, Neuromuscular re-education, Balance training, Gait training, Patient/Family education, Self Care, Joint mobilization, Joint  manipulation, Stair training, Orthotic/Fit training, DME instructions, Aquatic Therapy, Electrical stimulation, Cryotherapy, Moist heat, Taping, Ionotophoresis 4mg /ml Dexamethasone, Manual therapy, and Re-evaluation  PLAN FOR NEXT SESSION: Aquatic: strengthening and ROM, balance retraining, gait.  Land: progressive strengthening, stair climbing, STS transfers, Gait (w/without AD), HEP   [name] (Dawn) Anay Schneider MPT 11/20/2022, 9:24 AM

## 2022-12-02 ENCOUNTER — Ambulatory Visit
Admission: EM | Admit: 2022-12-02 | Discharge: 2022-12-02 | Disposition: A | Discharge status: Home / Self Care | Payer: BC Managed Care – PPO | Attending: Internal Medicine | Admitting: Internal Medicine

## 2022-12-02 DIAGNOSIS — R519 Headache, unspecified: Secondary | ICD-10-CM | POA: Diagnosis not present

## 2022-12-02 MED ORDER — FLUTICASONE PROPIONATE 50 MCG/ACT NA SUSP: 1.0000 | Freq: Every day | NASAL | 0 refills | Status: AC

## 2022-12-02 NOTE — ED Triage Notes (Signed)
Pt reports she has been having high blood pressure readings and now had been having headache and dizziness. Patient states she does not take any BP medications. Pt denies chest pain.  Home intervention: amlodipine (1 dose last night)

## 2022-12-02 NOTE — Discharge Instructions (Addendum)
I have sent you Flonase to the pharmacy to help alleviate headache and possible sinus congestion.  Monitor blood pressure closely over the next few days and follow-up with primary care doctor.

## 2022-12-02 NOTE — ED Provider Notes (Signed)
[facility]    CSN: [id number] Arrival date & time: 12/02/22  1031      History   Chief Complaint Chief Complaint  Patient presents with   Headache   Hypertension        Dizziness    HPI [name] is a 62 y.o. female.   Patient presents today due to concern of high blood pressure.  Reports that she went to her gynecologist a few days ago and her blood pressure was 175 systolic.  She does not check her blood pressure at home as she does not have a blood pressure cuff.  Patient was previously on amlodipine blood pressure medication but was taken off of it given that she had gastric surgery which helped improve her blood pressures.  She was seen in March by PCP with mildly elevated blood pressure reading but recheck was improved so no medications were started.  She did take a pill of leftover amlodipine last night given her concern.  Reports that she has also been having persistent, daily headaches for the past few months in the frontal portion of her head and around her eyes.  Reports the pain is 3/10 on pain scale.  She has not taken any medication for pain.  Denies dizziness, nausea, vomiting, blurred vision, chest pain, shortness of breath.  Denies any falls or head injuries.  Denies history of migraines.   Headache Hypertension  Dizziness   Past Medical History:  Diagnosis Date   Hypertension    followed by pcp   Incisional hernia    OA (osteoarthritis)    Pre-diabetes     Patient Active Problem List   Diagnosis Date Noted   Morbid obesity (HCC) 05/03/2021   Primary osteoarthritis of left knee 04/20/2014   Obesity (BMI 30-39.9) 02/06/2014   Anemia 08/08/2013   Asthma 08/08/2013   Bilateral knee pain 08/08/2013   Right foot pain 08/08/2013   Essential hypertension 06/23/2013   S/P hysterectomy - Davinci  10/07/2012   Menorrhagia with fibroids 10/07/2012    Past Surgical History:  Procedure Laterality Date   BUNIONECTOMY Right 2020   great  toe bunionectomy/  3rd toe pinning of fracture   CARPAL TUNNEL RELEASE Right 1994   HERNIA REPAIR  1996   HIATAL HERNIA REPAIR N/A 05/03/2021   Procedure: HERNIA REPAIR HIATAL;  Surgeon: Berna Bue, MD;  Location: [facility];  Service: General;  Laterality: N/A;   INCISIONAL HERNIA REPAIR N/A 05/19/2020   Procedure: OPEN HERNIA REPAIR INCISIONAL;  Surgeon: Abigail Miyamoto, MD;  Location: [facility];  Service: General;  Laterality: N/A;   LAPAROSCOPIC BILATERAL SALPINGECTOMY Bilateral 10/07/2012   Procedure: LAPAROSCOPIC BILATERAL SALPINGECTOMY;  Surgeon: Serita Kyle, MD;  Location: [facility];  Service: Gynecology;  Laterality: Bilateral;   LAPAROSCOPIC GASTRIC SLEEVE RESECTION N/A 05/03/2021   Procedure: LAPAROSCOPIC GASTRIC SLEEVE RESECTION;  Surgeon: Berna Bue, MD;  Location: [facility];  Service: General;  Laterality: N/A;   PARTIAL KNEE ARTHROPLASTY Left    ROBOTIC ASSISTED LAPAROSCOPIC LYSIS OF ADHESION N/A 10/07/2012   Procedure: ROBOTIC ASSISTED LAPAROSCOPIC LYSIS OF ADHESION;  Surgeon: Serita Kyle, MD;  Location: [facility];  Service: Gynecology;  Laterality: N/A;   ROBOTIC ASSISTED TOTAL HYSTERECTOMY N/A 10/07/2012   Procedure: ROBOTIC ASSISTED TOTAL HYSTERECTOMY;  Surgeon: Serita Kyle, MD;  Location: [facility];  Service: Gynecology;  Laterality: N/A;   UPPER GI ENDOSCOPY N/A 05/03/2021   Procedure: UPPER GI ENDOSCOPY;  Surgeon: Berna Bue, MD;  Location: [facility];  Service: General;  Laterality: N/A;    OB History   No obstetric history on file.      Home Medications    Prior to Admission medications   Medication Sig Start Date End Date Taking? Authorizing Provider  fluticasone (FLONASE) 50 MCG/ACT nasal spray Place 1 spray into both nostrils daily. 12/02/22  Yes Chasiti Waddington, Acie Fredrickson, FNP  albuterol (VENTOLIN HFA) 108 (90 Base) MCG/ACT inhaler Inhale 1-2 puffs into the lungs every 6 (six) hours as needed for wheezing or shortness of  breath. 02/27/22   Tomi Bamberger, PA-C  amLODipine (NORVASC) 10 MG tablet Take 10 mg by mouth daily.    [provider]  gabapentin (NEURONTIN) 100 MG capsule Take 2 capsules (200 mg total) by mouth every 12 (twelve) hours. 05/04/21   Berna Bue, MD  pantoprazole (PROTONIX) 40 MG tablet Take 1 tablet (40 mg total) by mouth daily. 05/04/21   Berna Bue, MD    Family History Family History  Family history unknown: Yes    Social History Social History   Tobacco Use   Smoking status: Never   Smokeless tobacco: Never  Vaping Use   Vaping status: Never Used  Substance Use Topics   Alcohol use: No   Drug use: Never     Allergies   Aspirin, Codeine, Cyclobenzaprine, Ibuprofen, Penicillins, and Latex   Review of Systems Review of Systems Per HPI  Physical Exam Triage Vital Signs ED Triage Vitals  Encounter Vitals Group     BP 12/02/22 1050 119/68     Systolic BP Percentile --      Diastolic BP Percentile --      Pulse Rate 12/02/22 1050 77     Resp 12/02/22 1050 18     Temp 12/02/22 1050 97.7 F (36.5 C)     Temp Source 12/02/22 1050 Oral     SpO2 12/02/22 1050 97 %     Weight --      Height --      Head Circumference --      Peak Flow --      Pain Score 12/02/22 1049 3     Pain Loc --      Pain Education --      Exclude from Growth Chart --    No data found.  Updated Vital Signs BP 119/68 (BP Location: Left Arm)   Pulse 77   Temp 97.7 F (36.5 C) (Oral)   Resp 18   LMP 10/01/2012   SpO2 97%   Visual Acuity Right Eye Distance:   Left Eye Distance:   Bilateral Distance:    Right Eye Near:   Left Eye Near:    Bilateral Near:     Physical Exam Constitutional:      General: She is not in acute distress.    Appearance: Normal appearance. She is not toxic-appearing or diaphoretic.  HENT:     Head: Normocephalic and atraumatic.     Nose: Congestion present.  Eyes:     Extraocular Movements: Extraocular movements intact.      Conjunctiva/sclera: Conjunctivae normal.     Pupils: Pupils are equal, round, and reactive to light.  Cardiovascular:     Rate and Rhythm: Normal rate and regular rhythm.     Pulses: Normal pulses.     Heart sounds: Normal heart sounds.  Pulmonary:     Effort: Pulmonary effort is normal. No respiratory distress.     Breath sounds: Normal breath  sounds.  Neurological:     General: No focal deficit present.     Mental Status: She is alert and oriented to person, place, and time. Mental status is at baseline.     Cranial Nerves: Cranial nerves 2-12 are intact.     Sensory: Sensation is intact.     Motor: Motor function is intact.     Coordination: Coordination is intact.     Gait: Gait is intact.  Psychiatric:        Mood and Affect: Mood normal.        Behavior: Behavior normal.        Thought Content: Thought content normal.        Judgment: Judgment normal.      UC Treatments / Results  Labs (all labs ordered are listed, but only abnormal results are displayed) Labs Reviewed - No data to display  EKG   Radiology No results found.  Procedures Procedures (including critical care time)  Medications Ordered in UC Medications - No data to display  Initial Impression / Assessment and Plan / UC Course  I have reviewed the triage vital signs and the nursing notes.  Pertinent labs & imaging results that were available during my care of the patient were reviewed by me and considered in my medical decision making (see chart for details).     Patient's blood pressure is normal today.  Patient reports that she is going to have availability of a blood pressure cuff tomorrow.  Encouraged her to monitor her blood pressure very closely over the next few days and write it down.  Will defer any blood pressure medication at this time given blood pressure is normal for [place] today in urgent care.  I have a low suspicion that patient's headaches are due to blood pressure as she has some  sinus pain and nasal congestion.  Therefore, suspect that headaches are due to sinuses.  Will prescribe Flonase to see if this will be helpful.  Patient reports that she has an appointment with the PCP in approximately 6 days so encouraged her to keep this appointment for follow-up.  Although, she was given strict return and ER precautions as well regarding current symptoms.  Do not think emergent evaluation is necessary at this time as patient's vital signs are stable and neuroexam is normal.  Patient verbalized understanding and was agreeable with plan. Final Clinical Impressions(s) / UC Diagnoses   Final diagnoses:  Persistent headaches     Discharge Instructions      I have sent you Flonase to the pharmacy to help alleviate headache and possible sinus congestion.  Monitor blood pressure closely over the next few days and follow-up with primary care doctor.    ED Prescriptions     Medication Sig Dispense Auth. Provider   fluticasone (FLONASE) 50 MCG/ACT nasal spray Place 1 spray into both nostrils daily. 16 g Gustavus Bryant, [place]      PDMP not reviewed this encounter.   Gustavus Bryant, [place] 12/02/22 1145

## 2022-12-06 ENCOUNTER — Ambulatory Visit (HOSPITAL_BASED_OUTPATIENT_CLINIC_OR_DEPARTMENT_OTHER): Payer: BC Managed Care – PPO | Admitting: Physical Therapy

## 2022-12-06 ENCOUNTER — Encounter (HOSPITAL_BASED_OUTPATIENT_CLINIC_OR_DEPARTMENT_OTHER): Payer: Self-pay | Admitting: Physical Therapy

## 2022-12-06 DIAGNOSIS — R2689 Other abnormalities of gait and mobility: Secondary | ICD-10-CM

## 2022-12-06 DIAGNOSIS — M1711 Unilateral primary osteoarthritis, right knee: Secondary | ICD-10-CM | POA: Diagnosis not present

## 2022-12-06 DIAGNOSIS — M6281 Muscle weakness (generalized): Secondary | ICD-10-CM

## 2022-12-06 DIAGNOSIS — G8929 Other chronic pain: Secondary | ICD-10-CM

## 2022-12-06 NOTE — Therapy (Signed)
OUTPATIENT PHYSICAL THERAPY LOWER EXTREMITY EVALUATION   Patient Name: [name] MRN: [id number] DOB:[birth year], 62 y.o., female Today's Date: 12/06/2022  END OF SESSION:  PT End of Session - 12/06/22 1119     Visit Number 3    Number of Visits 16    Date for PT Re-Evaluation 01/05/23    Authorization Type [employer]    Progress Note Due on Visit 10    PT Start Time 1120    PT Stop Time 1200    PT Time Calculation (min) 40 min    Activity Tolerance Patient tolerated treatment well;Patient limited by pain    Behavior During Therapy WFL for tasks assessed/performed             Past Medical History:  Diagnosis Date   Hypertension    followed by pcp   Incisional hernia    OA (osteoarthritis)    Pre-diabetes    Past Surgical History:  Procedure Laterality Date   BUNIONECTOMY Right 2020   great toe bunionectomy/  3rd toe pinning of fracture   CARPAL TUNNEL RELEASE Right 1994   HERNIA REPAIR  1996   HIATAL HERNIA REPAIR N/A 05/03/2021   Procedure: HERNIA REPAIR HIATAL;  Surgeon: Berna Bue, MD;  Location: [facility];  Service: General;  Laterality: N/A;   INCISIONAL HERNIA REPAIR N/A 05/19/2020   Procedure: OPEN HERNIA REPAIR INCISIONAL;  Surgeon: Abigail Miyamoto, MD;  Location: [facility];  Service: General;  Laterality: N/A;   LAPAROSCOPIC BILATERAL SALPINGECTOMY Bilateral 10/07/2012   Procedure: LAPAROSCOPIC BILATERAL SALPINGECTOMY;  Surgeon: Serita Kyle, MD;  Location: [facility];  Service: Gynecology;  Laterality: Bilateral;   LAPAROSCOPIC GASTRIC SLEEVE RESECTION N/A 05/03/2021   Procedure: LAPAROSCOPIC GASTRIC SLEEVE RESECTION;  Surgeon: Berna Bue, MD;  Location: [facility];  Service: General;  Laterality: N/A;   PARTIAL KNEE ARTHROPLASTY Left    ROBOTIC ASSISTED LAPAROSCOPIC LYSIS OF ADHESION N/A 10/07/2012   Procedure: ROBOTIC ASSISTED LAPAROSCOPIC LYSIS OF ADHESION;  Surgeon: Serita Kyle, MD;  Location: [facility];  Service:  Gynecology;  Laterality: N/A;   ROBOTIC ASSISTED TOTAL HYSTERECTOMY N/A 10/07/2012   Procedure: ROBOTIC ASSISTED TOTAL HYSTERECTOMY;  Surgeon: Serita Kyle, MD;  Location: [facility];  Service: Gynecology;  Laterality: N/A;   UPPER GI ENDOSCOPY N/A 05/03/2021   Procedure: UPPER GI ENDOSCOPY;  Surgeon: Berna Bue, MD;  Location: [facility];  Service: General;  Laterality: N/A;   Patient Active Problem List   Diagnosis Date Noted   Morbid obesity (HCC) 05/03/2021   Primary osteoarthritis of left knee 04/20/2014   Obesity (BMI 30-39.9) 02/06/2014   Anemia 08/08/2013   Asthma 08/08/2013   Bilateral knee pain 08/08/2013   Right foot pain 08/08/2013   Essential hypertension 06/23/2013   S/P hysterectomy - Davinci  10/07/2012   Menorrhagia with fibroids 10/07/2012    PCP: Leonard Downing   REFERRING PROVIDER: Lenora Boys, MD   REFERRING DIAG: M17.11 (ICD-10-CM) - Unilateral primary osteoarthritis, right knee   THERAPY DIAG:  Chronic pain of both knees  Muscle weakness (generalized)  Other abnormalities of gait and mobility  Rationale for Evaluation and Treatment: Rehabilitation  ONSET DATE: exacerbation 3 months   SUBJECTIVE:   SUBJECTIVE STATEMENT: I feel good.  No rain.  Initial subjective I had gastro surgery and have lost 85lbs.  Came here last year to get ready for the surgery.  Now I need to have 2 TKR's want to try to wait a year if I  can.  Can't walk on treadmill anymore cause my knees hurt too much. Had a steroid injection 1 month ago.  Can stand and walk ~20  mins.  PERTINENT HISTORY: Pt reports since weight loss no more HBP nore kidney dysfunction PAIN:  Are you having pain? Yes: NPRS scale: 2-3/10 Pain location: right knee (recent injection) Pain description: tingly, ache Aggravating factors: standing, walking, sitting still driving bus Relieving factors: resting, heating pads  PAIN:  Are you having pain? Yes: NPRS scale: 4-5/10 Pain  location: Left knee Pain description: continuous ache/sore behind knee Aggravating factors: walking/standing, walking too long, sitting still Relieving factors: resting   PRECAUTIONS: None  RED FLAGS: None   WEIGHT BEARING RESTRICTIONS: No  FALLS:  Has patient fallen in last 6 months? No  LIVING ENVIRONMENT: Lives with: lives alone Lives in: House/apartment Stairs: No Has following equipment at home: Grab bars  OCCUPATION: school bus driver  PLOF: Independent  PATIENT GOALS: decrease pain, be able to walk better, get more weight off.  NEXT MD VISIT: 3 months  OBJECTIVE:   DIAGNOSTIC FINDINGS: none in chart  PATIENT SURVEYS:  Primary score 48% with goal of 65%  COGNITION: Overall cognitive status: Within functional limits for tasks assessed     SENSATION: WFL    MUSCLE LENGTH: Hamstrings: full in seated knee extension   POSTURE: flexed trunk  and weight shift right  PALPATION: Tenderness Right patellar mobilizations  LOWER EXTREMITY ROM:  Active ROM Right eval Left eval  Hip flexion    Hip extension    Hip abduction    Hip adduction    Hip internal rotation    Hip external rotation    Knee flexion 90 114  Knee extension 0 Arom -10/ passive 0  Ankle dorsiflexion    Ankle plantarflexion    Ankle inversion    Ankle eversion     (Blank rows = not tested)  LOWER EXTREMITY MMT:  MMT Right eval Left eval  Hip flexion 28.8 53.0  Hip extension    Hip abduction 35.5 46.1  Hip adduction    Hip internal rotation    Hip external rotation    Knee flexion    Knee extension 31.7 30.2  Ankle dorsiflexion    Ankle plantarflexion    Ankle inversion    Ankle eversion     (Blank rows = not tested)  LOWER EXTREMITY SPECIAL TESTS:  Knee special tests: Patellafemoral grind test: positive  bilatterally  FUNCTIONAL TESTS:  30 s STS: 3 using UE Timed up and go (TUG): 15.38 4 Stage balance passed NBOS, semi tandem and tandem. SLS x 2  s  GAIT: Distance walked: 500 ft Assistive device utilized: None Level of assistance: Complete Independence Comments: Off loading right, slowed cadence/antalgic, right knee valgus deformity.   TODAY'S TREATMENT:  Pt seen for aquatic therapy today.  Treatment took place in water 3.5-4.75 ft in depth at the [facility] pool. Temp of water was 91.  Pt entered/exited the pool via stairs using step to pattern with hand rail.  *walking forward, back and side stepping in 4.21ft ue support barbell multiple widths  - cues for knee flex, heel strike, toe off.  *Standing Ue support on wall: df, high knee marching .  *Moved off wall ue support yellow HB: high knee marching; pf; DF; hip flex; hip add/abd;  hip extension; relaxed squats .  VC for core stabilization *Balance and proprioception engagement ue support yellow HB: hip flex/extension; 3 way tap x 5 each *STS from bench onto water step ue unsupported.  VC and demonstration for execution. Will continue with next session   Pt requires the buoyancy and hydrostatic pressure of water for support, and to offload joints by unweighting joint load by at least 50 % in navel deep water and by at least 75-80% in chest to neck deep water.  Viscosity of the water is needed for resistance of strengthening. Water current perturbations provides challenge to standing balance requiring increased core activation.     PATIENT EDUCATION:  Education details: Discussed eval findings, rehab rationale, aquatic program progression/POC and pools in area. Patient is in agreement   Person educated: Patient Education method: Explanation Education comprehension: verbalized understanding  HOME EXERCISE PROGRAM: TBA  ASSESSMENT:  CLINICAL IMPRESSION: Pt with some constraints with scheduling due to job. She will work with Scientist, research (physical sciences).  Pt  worked in 3.8 to 4.0 ft exercising today as needed for postural control and balance. Cues/instruction for core engagement abdominal bracing to improve upright positioning/posture.  She does have some difficulty maintaining erect posture likely due to core weakness. Plan to increase core strengthening. Good toleration to session and last.  She reports decrease in pain sensitivity as well general overall feeling good. Goals ongoing.    Initial assessment: Patient is a 62 y.o. F who was seen today for physical therapy evaluation and treatment for Right (bilateral) knee pain. She has recently lost 85lb and qualifies for the surgical intervention but is wanting to delay it if able until next summer.  She has had a partial knee replacement in left in past and presently Left knee pain >right.  She had a steroid injection in the right recently. She presents amb without an AD offloading to right.  Valgus deformity right knee present. Functional testing demonstrates a decrease lower extremity strength, transitional movements, and balance. She will benefit from skilled physical therapy intervention initially in aquatic setting using the properties of water to regain strength and movement while decreasing pain then transition to land based for progressive strengthening to maximize function and mobility.  OBJECTIVE IMPAIRMENTS: Abnormal gait, decreased balance, decreased mobility, difficulty walking, decreased ROM, decreased strength, obesity, and pain.   ACTIVITY LIMITATIONS: lifting, sitting, standing, squatting, stairs, and locomotion level  PERSONAL FACTORS: Past/current experiences, Time since onset of injury/illness/exacerbation, and 1 comorbidity: obesity  are also affecting patient's functional outcome.   REHAB POTENTIAL: Good  CLINICAL DECISION MAKING: Evolving/moderate complexity  EVALUATION COMPLEXITY: Moderate   GOALS: Goals reviewed with patient? Yes  SHORT TERM GOALS: Target date: 12/08/22 Pt  will tolerate full aquatic sessions consistently without increase in pain and with improving function to demonstrate good toleration and effectiveness of intervention.  Baseline: Goal status: INITIAL  2.  Pt will improve on Tug test to <or= 12s to demonstrate improvement in lower extremity  function, mobility and decreased fall risk. Baseline: 15.38 Goal status: INITIAL  3.  Pt will report decrease in pain to <2/10 left knee for improved toleration to activity/quality of life and to demonstrate improved management of pain.  Baseline: see chart Goal status: INITIAL  4.  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  Pt will be indep with final HEP's (land and aquatic as appropriate) for continued management of condition Baseline:  Goal status: INITIAL  3.  Pt will improve strength in all areas listed by 10lb or > to demonstrate improved overall physical function Baseline: see chart Goal status: INITIAL  4.  Pt will improve on 30s STS test to <or= 7 to demonstrate improving functional lower extremity strength, transitional movements, and balance Baseline: 3 Goal status: INITIAL  5.  Pt will report toleration to amb improved to up towards 1 hour without limitation of knee pain Baseline:  Goal status: INITIAL  6.  Pt will improve ROM right knee flex by at least 10 degrees to improve function Baseline: see chart Goal status: INITIAL   PLAN:  PT FREQUENCY: 1-2x/week  PT DURATION: 8 weeks  PLANNED INTERVENTIONS: Therapeutic exercises, Therapeutic activity, Neuromuscular re-education, Balance training, Gait training, Patient/Family education, Self Care, Joint mobilization, Joint manipulation, Stair training, Orthotic/Fit training, DME  instructions, Aquatic Therapy, Electrical stimulation, Cryotherapy, Moist heat, Taping, Ionotophoresis 4mg /ml Dexamethasone, Manual therapy, and Re-evaluation  PLAN FOR NEXT SESSION: Aquatic: strengthening and ROM, balance retraining, gait.  Land: progressive strengthening, stair climbing, STS transfers, Gait (w/without AD), HEP   Grover [place] (Frankie) Sacha Topor MPT 12/06/2022, 11:24 AM

## 2022-12-08 ENCOUNTER — Ambulatory Visit (HOSPITAL_BASED_OUTPATIENT_CLINIC_OR_DEPARTMENT_OTHER): Payer: BC Managed Care – PPO | Admitting: Physical Therapy

## 2022-12-12 ENCOUNTER — Ambulatory Visit (HOSPITAL_BASED_OUTPATIENT_CLINIC_OR_DEPARTMENT_OTHER): Payer: BC Managed Care – PPO | Attending: Family Medicine | Admitting: Physical Therapy

## 2022-12-12 ENCOUNTER — Encounter (HOSPITAL_BASED_OUTPATIENT_CLINIC_OR_DEPARTMENT_OTHER): Payer: Self-pay | Admitting: Physical Therapy

## 2022-12-12 DIAGNOSIS — G8929 Other chronic pain: Secondary | ICD-10-CM | POA: Diagnosis present

## 2022-12-12 DIAGNOSIS — R293 Abnormal posture: Secondary | ICD-10-CM | POA: Diagnosis present

## 2022-12-12 DIAGNOSIS — M25562 Pain in left knee: Secondary | ICD-10-CM | POA: Diagnosis present

## 2022-12-12 DIAGNOSIS — M6281 Muscle weakness (generalized): Secondary | ICD-10-CM | POA: Diagnosis present

## 2022-12-12 DIAGNOSIS — R2689 Other abnormalities of gait and mobility: Secondary | ICD-10-CM | POA: Insufficient documentation

## 2022-12-12 DIAGNOSIS — M25561 Pain in right knee: Secondary | ICD-10-CM | POA: Diagnosis present

## 2022-12-12 NOTE — Therapy (Addendum)
OUTPATIENT PHYSICAL THERAPY LOWER EXTREMITY TREATMENT Progress note/re-cert Progress Note Reporting Period 11/10/22 to 12/12/22  See note below for Objective Data and Assessment of Progress/Goals.       Patient Name: [name] MRN: [id number] DOB:[birth year], 62 y.o., female Today's Date: 01/09/2023  END OF SESSION:  PT End of Session -     Visit Number 4   Number of Visits 16   Date for PT Re-Evaluation 03/02/23   Authorization Type    PT Start Time 1121    PT Stop Time 1159   PT Time Calculation (min) 38   Activity Tolerance Pt tol well   Behavior During Therapy  WFL for tasks      Past Medical History:  Diagnosis Date   Hypertension    followed by pcp   Incisional hernia    OA (osteoarthritis)    Pre-diabetes    Past Surgical History:  Procedure Laterality Date   BUNIONECTOMY Right 2020   great toe bunionectomy/  3rd toe pinning of fracture   CARPAL TUNNEL RELEASE Right 1994   HERNIA REPAIR  1996   HIATAL HERNIA REPAIR N/A 05/03/2021   Procedure: HERNIA REPAIR HIATAL;  Surgeon: Berna Bue, MD;  Location: [facility];  Service: General;  Laterality: N/A;   INCISIONAL HERNIA REPAIR N/A 05/19/2020   Procedure: OPEN HERNIA REPAIR INCISIONAL;  Surgeon: Abigail Miyamoto, MD;  Location: [facility];  Service: General;  Laterality: N/A;   LAPAROSCOPIC BILATERAL SALPINGECTOMY Bilateral 10/07/2012   Procedure: LAPAROSCOPIC BILATERAL SALPINGECTOMY;  Surgeon: Serita Kyle, MD;  Location: [facility];  Service: Gynecology;  Laterality: Bilateral;   LAPAROSCOPIC GASTRIC SLEEVE RESECTION N/A 05/03/2021   Procedure: LAPAROSCOPIC GASTRIC SLEEVE RESECTION;  Surgeon: Berna Bue, MD;  Location: [facility];  Service: General;  Laterality: N/A;   PARTIAL KNEE ARTHROPLASTY Left    ROBOTIC ASSISTED LAPAROSCOPIC LYSIS OF ADHESION N/A 10/07/2012   Procedure: ROBOTIC ASSISTED LAPAROSCOPIC LYSIS OF ADHESION;  Surgeon: Serita Kyle, MD;  Location: [facility];   Service: Gynecology;  Laterality: N/A;   ROBOTIC ASSISTED TOTAL HYSTERECTOMY N/A 10/07/2012   Procedure: ROBOTIC ASSISTED TOTAL HYSTERECTOMY;  Surgeon: Serita Kyle, MD;  Location: [facility];  Service: Gynecology;  Laterality: N/A;   UPPER GI ENDOSCOPY N/A 05/03/2021   Procedure: UPPER GI ENDOSCOPY;  Surgeon: Berna Bue, MD;  Location: [facility];  Service: General;  Laterality: N/A;   Patient Active Problem List   Diagnosis Date Noted   Morbid obesity (HCC) 05/03/2021   Primary osteoarthritis of left knee 04/20/2014   Obesity (BMI 30-39.9) 02/06/2014   Anemia 08/08/2013   Asthma 08/08/2013   Bilateral knee pain 08/08/2013   Right foot pain 08/08/2013   Essential hypertension 06/23/2013   S/P hysterectomy - Davinci  10/07/2012   Menorrhagia with fibroids 10/07/2012    PCP: Leonard Downing   REFERRING PROVIDER: Lenora Boys, MD   REFERRING DIAG: M17.11 (ICD-10-CM) - Unilateral primary osteoarthritis, right knee   THERAPY DIAG:  Chronic pain of both knees  Muscle weakness (generalized)  Other abnormalities of gait and mobility  Abnormal posture  Rationale for Evaluation and Treatment: Rehabilitation  ONSET DATE: exacerbation 3 months   SUBJECTIVE:   SUBJECTIVE STATEMENT: "I'm doing pretty good because there's no rain today".    From evaluation"  I had gastro surgery and have lost 85lbs.  Came here last year to get ready for the surgery.  Now I need to have 2 TKR's want to try to wait a  year if I can.  Can't walk on treadmill anymore cause my knees hurt too much. Had a steroid injection 1 month ago.  Can stand and walk ~20  mins.  PERTINENT HISTORY: Pt reports since weight loss no more HBP nore kidney dysfunction PAIN:  Are you having pain? Yes: NPRS scale: 2-3/10 Pain location: bilat knee Pain description: tight, achey Aggravating factors: standing, walking, sitting still driving bus Relieving factors: resting, heating pads   PRECAUTIONS:  None  RED FLAGS: None   WEIGHT BEARING RESTRICTIONS: No  FALLS:  Has patient fallen in last 6 months? No  LIVING ENVIRONMENT: Lives with: lives alone Lives in: House/apartment Stairs: No Has following equipment at home: Grab bars  OCCUPATION: school bus driver  PLOF: Independent  PATIENT GOALS: decrease pain, be able to walk better, get more weight off.  NEXT MD VISIT: SEPT 16, 2024  OBJECTIVE:   DIAGNOSTIC FINDINGS: none in chart  PATIENT SURVEYS:  Primary score 48% with goal of 65%  COGNITION: Overall cognitive status: Within functional limits for tasks assessed     SENSATION: WFL    MUSCLE LENGTH: Hamstrings: full in seated knee extension   POSTURE: flexed trunk  and weight shift right  PALPATION: Tenderness Right patellar mobilizations  LOWER EXTREMITY ROM:  Active ROM Right eval Left eval Right  12/12/22 Left 12/12/22  Hip flexion      Hip extension      Hip abduction      Hip adduction      Hip internal rotation      Hip external rotation      Knee flexion 90 114 110   Knee extension 0 Arom -10/ passive 0  AROM -5  Ankle dorsiflexion      Ankle plantarflexion      Ankle inversion      Ankle eversion       (Blank rows = not tested)  LOWER EXTREMITY MMT:  MMT Right eval Left eval  Hip flexion 28.8 53.0  Hip extension    Hip abduction 35.5 46.1  Hip adduction    Hip internal rotation    Hip external rotation    Knee flexion    Knee extension 31.7 30.2  Ankle dorsiflexion    Ankle plantarflexion    Ankle inversion    Ankle eversion     (Blank rows = not tested)  LOWER EXTREMITY SPECIAL TESTS:  Knee special tests: Patellafemoral grind test: positive  bilatterally  FUNCTIONAL TESTS:  30 s STS: 3 using UE Timed up and go (TUG): 15.38 4 Stage balance passed NBOS, semi tandem and tandem. SLS x 2 s  GAIT: Distance walked: 500 ft Assistive device utilized: None Level of assistance: Complete Independence Comments: Off  loading right, slowed cadence/antalgic, right knee valgus deformity.   TODAY'S TREATMENT:  Pt seen for aquatic therapy today.  Treatment took place in water 3.5-4.75 ft in depth at the [facility] pool. Temp of water was 91.  Pt entered/exited the pool via stairs using step to pattern with hand rail.  *UE support with yellow hand floats: walking forward, back and side stepping in 4.60ft  multiple widths * side stepping with arm addct with rainbow hand floats (cues for technique) * bilat rainbow hand floats under water at side: high knee marching in place * UE support on rainbow hand floats:  3 way toe touch x 5 reps;  heel raises x 10; hip abdct/ addct x 10 each; leg swings into hp abdct/ addct (balance challenge!) x 10 each  * forward marching with alternating row with rainbow hand floats  *STS from bench in water with feet on blue step, with cues for controlled descent, forward arm reach. X 8    Pt requires the buoyancy and hydrostatic pressure of water for support, and to offload joints by unweighting joint load by at least 50 % in navel deep water and by at least 75-80% in chest to neck deep water.  Viscosity of the water is needed for resistance of strengthening. Water current perturbations provides challenge to standing balance requiring increased core activation.     PATIENT EDUCATION:  Education details: aquatic exercise progressions/modifications   Person educated: Patient Education method: Explanation Education comprehension: verbalized understanding  HOME EXERCISE PROGRAM: TBA  ASSESSMENT:  CLINICAL IMPRESSION: Pt demonstrated improved Rt knee ROM; has met LTG #6.  She reported some discomfort in Lt ant knee with Lt leg swing into ext.  Overall good tolerance for aquatic exercise today.  Progressing gradually towards goals.  Therapist to check  STG next visit prior to entry in water.   PN: Pt making good progress with ROM R and L knee as noted above in chart. She has met 1 STG's and 1 LTG and has progressed nicely with a reduction of knee pain.  She will continue to benefit from skilled physical therapy continuing in the pool  then transitioning to land  in the next few weeks. Will plan to recert out for 8 more weeks hopefully with more consistent scheduling for pt to reach optimal outcome     OBJECTIVE IMPAIRMENTS: Abnormal gait, decreased balance, decreased mobility, difficulty walking, decreased ROM, decreased strength, obesity, and pain.   ACTIVITY LIMITATIONS: lifting, sitting, standing, squatting, stairs, and locomotion level  PERSONAL FACTORS: Past/current experiences, Time since onset of injury/illness/exacerbation, and 1 comorbidity: obesity  are also affecting patient's functional outcome.   REHAB POTENTIAL: Good  CLINICAL DECISION MAKING: Evolving/moderate complexity  EVALUATION COMPLEXITY: Moderate   GOALS: Goals reviewed with patient? Yes  SHORT TERM GOALS: Target date: 12/08/22 Pt will tolerate full aquatic sessions consistently without increase in pain and with improving function to demonstrate good toleration and effectiveness of intervention.  Baseline: Goal status: met 12/12/22  2.  Pt will improve on Tug test to <or= 12s to demonstrate improvement in lower extremity function, mobility and decreased fall risk. Baseline: 15.38 Goal status: INITIAL  3.  Pt will report decrease in pain to <2/10 left knee for improved toleration to activity/quality of life and to demonstrate improved management of pain.  Baseline: see chart Goal status: INITIAL  4.  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  Pt will be indep with final HEP's (land and aquatic as appropriate) for continued management of condition Baseline:  Goal status: INITIAL  3.  Pt will improve strength in all areas listed by 10lb or > to demonstrate improved overall physical function Baseline: see chart Goal status: INITIAL  4.  Pt will improve on 30s STS test to <or= 7 to demonstrate improving functional lower extremity strength, transitional movements, and balance Baseline: 3 Goal status: INITIAL  5.  Pt will report toleration to amb improved to up towards 1 hour without limitation of knee pain Baseline:  Goal status: INITIAL  6.  Pt will improve ROM right knee flex by at least 10 degrees to improve function Baseline: see chart Goal status: met - 12/12/22   PLAN:  PT FREQUENCY: 1-2x/week  PT DURATION: 8 weeks  PLANNED INTERVENTIONS: Therapeutic exercises, Therapeutic activity, Neuromuscular re-education, Balance training, Gait training, Patient/Family education, Self Care, Joint mobilization, Joint manipulation, Stair training, Orthotic/Fit training, DME instructions, Aquatic Therapy, Electrical stimulation, Cryotherapy, Moist heat, Taping, Ionotophoresis 4mg /ml Dexamethasone, Manual therapy, and Re-evaluation  PLAN FOR NEXT SESSION: Aquatic: strengthening and ROM, balance retraining, gait.  Land: progressive strengthening, stair climbing, STS transfers, Gait (w/without AD), HEP  Mayer Camel, PTA 01/09/23 3:07 PM [facility] [address] [place], [place], [address] Phone: [phone]   Fax:  [phone]  Addend Rushie Chestnut) Ziemba MPT 01/09/23 324p

## 2023-01-09 ENCOUNTER — Encounter (HOSPITAL_BASED_OUTPATIENT_CLINIC_OR_DEPARTMENT_OTHER): Payer: Self-pay | Admitting: Physical Therapy

## 2023-01-09 ENCOUNTER — Ambulatory Visit (HOSPITAL_BASED_OUTPATIENT_CLINIC_OR_DEPARTMENT_OTHER): Payer: BC Managed Care – PPO | Attending: Family Medicine | Admitting: Physical Therapy

## 2023-01-09 DIAGNOSIS — G8929 Other chronic pain: Secondary | ICD-10-CM | POA: Insufficient documentation

## 2023-01-09 DIAGNOSIS — M6281 Muscle weakness (generalized): Secondary | ICD-10-CM | POA: Insufficient documentation

## 2023-01-09 DIAGNOSIS — R2689 Other abnormalities of gait and mobility: Secondary | ICD-10-CM | POA: Diagnosis present

## 2023-01-09 DIAGNOSIS — M25561 Pain in right knee: Secondary | ICD-10-CM | POA: Insufficient documentation

## 2023-01-09 DIAGNOSIS — R293 Abnormal posture: Secondary | ICD-10-CM | POA: Diagnosis present

## 2023-01-09 DIAGNOSIS — M25562 Pain in left knee: Secondary | ICD-10-CM | POA: Diagnosis present

## 2023-01-09 NOTE — Addendum Note (Signed)
Addended by: Lynnette Caffey F on: 01/09/2023 03:26 PM   Modules accepted: Orders

## 2023-01-09 NOTE — Therapy (Addendum)
OUTPATIENT PHYSICAL THERAPY LOWER EXTREMITY TREATMENT PHYSICAL THERAPY DISCHARGE SUMMARY  Visits from Start of Care: 5  Current functional level related to goals / functional outcomes: Pt progressing well towards all goals.  She is as safe and indep with ADL's and functionl; mobility as able   Remaining deficits: Chronic knee pain   Education / Equipment: Management of condition; HEP   Patient agrees to discharge. Patient goals were partially met. Patient is being discharged due to not returning since the last visit.   Patient Name: [name] MRN: [id number] DOB:Jun 27, 1960, 62 y.o., female Today's Date: 01/09/2023  END OF SESSION:  PT End of Session - 01/09/23 1115     Visit Number 5    Number of Visits 16    Date for PT Re-Evaluation 01/05/23    Authorization Type [employer]    Progress Note Due on Visit 10    PT Start Time 1115    PT Stop Time 1210    PT Time Calculation (min) 55 min    Activity Tolerance Patient tolerated treatment well    Behavior During Therapy WFL for tasks assessed/performed             Past Medical History:  Diagnosis Date   Hypertension    followed by pcp   Incisional hernia    OA (osteoarthritis)    Pre-diabetes    Past Surgical History:  Procedure Laterality Date   BUNIONECTOMY Right 2020   great toe bunionectomy/  3rd toe pinning of fracture   CARPAL TUNNEL RELEASE Right 1994   HERNIA REPAIR  1996   HIATAL HERNIA REPAIR N/A 05/03/2021   Procedure: HERNIA REPAIR HIATAL;  Surgeon: Berna Bue, MD;  Location: [facility];  Service: General;  Laterality: N/A;   INCISIONAL HERNIA REPAIR N/A 05/19/2020   Procedure: OPEN HERNIA REPAIR INCISIONAL;  Surgeon: Abigail Miyamoto, MD;  Location: [facility];  Service: General;  Laterality: N/A;   LAPAROSCOPIC BILATERAL SALPINGECTOMY Bilateral 10/07/2012   Procedure: LAPAROSCOPIC BILATERAL SALPINGECTOMY;  Surgeon: Serita Kyle, MD;  Location: [facility];  Service: Gynecology;   Laterality: Bilateral;   LAPAROSCOPIC GASTRIC SLEEVE RESECTION N/A 05/03/2021   Procedure: LAPAROSCOPIC GASTRIC SLEEVE RESECTION;  Surgeon: Berna Bue, MD;  Location: [facility];  Service: General;  Laterality: N/A;   PARTIAL KNEE ARTHROPLASTY Left    ROBOTIC ASSISTED LAPAROSCOPIC LYSIS OF ADHESION N/A 10/07/2012   Procedure: ROBOTIC ASSISTED LAPAROSCOPIC LYSIS OF ADHESION;  Surgeon: Serita Kyle, MD;  Location: [facility];  Service: Gynecology;  Laterality: N/A;   ROBOTIC ASSISTED TOTAL HYSTERECTOMY N/A 10/07/2012   Procedure: ROBOTIC ASSISTED TOTAL HYSTERECTOMY;  Surgeon: Serita Kyle, MD;  Location: [facility];  Service: Gynecology;  Laterality: N/A;   UPPER GI ENDOSCOPY N/A 05/03/2021   Procedure: UPPER GI ENDOSCOPY;  Surgeon: Berna Bue, MD;  Location: [facility];  Service: General;  Laterality: N/A;   Patient Active Problem List   Diagnosis Date Noted   Morbid obesity (HCC) 05/03/2021   Primary osteoarthritis of left knee 04/20/2014   Obesity (BMI 30-39.9) 02/06/2014   Anemia 08/08/2013   Asthma 08/08/2013   Bilateral knee pain 08/08/2013   Right foot pain 08/08/2013   Essential hypertension 06/23/2013   S/P hysterectomy - Davinci  10/07/2012   Menorrhagia with fibroids 10/07/2012    PCP: Nathaneil Canary, PA-C   REFERRING PROVIDER: Lenora Boys, MD   REFERRING DIAG: M17.11 (ICD-10-CM) - Unilateral primary osteoarthritis, right knee   THERAPY DIAG:  Chronic pain of both  knees  Muscle weakness (generalized)  Other abnormalities of gait and mobility  Abnormal posture  Rationale for Evaluation and Treatment: Rehabilitation  ONSET DATE: exacerbation 3 months   SUBJECTIVE:   SUBJECTIVE STATEMENT: "Dr took 2 tubes of fluid off my knee and gave me an injection, and I feel good today. No pain.".  (Dr appt was on 12/25/22).  She plans to have her knee replacements after she retires next September.  She states that the doctor would like her to ride stationary  bike instead of treadmill and continue PT.    From evaluation"  I had gastro surgery and have lost 85lbs.  Came here last year to get ready for the surgery.  Now I need to have 2 TKR's want to try to wait a year if I can.  Can't walk on treadmill anymore cause my knees hurt too much. Had a steroid injection 1 month ago.  Can stand and walk ~20  mins.  PERTINENT HISTORY: Pt reports since weight loss no more HBP nore kidney dysfunction PAIN:  Are you having pain? no: NPRS scale: 0/10 Pain location: bilat knee Pain description:  Aggravating factors: standing, walking, sitting still driving bus Relieving factors: resting, heating pads   PRECAUTIONS: None  RED FLAGS: None   WEIGHT BEARING RESTRICTIONS: No  FALLS:  Has patient fallen in last 6 months? No  LIVING ENVIRONMENT: Lives with: lives alone Lives in: House/apartment Stairs: No Has following equipment at home: Grab bars  OCCUPATION: school bus driver  PLOF: Independent  PATIENT GOALS: decrease pain, be able to walk better, get more weight off.  NEXT MD VISIT:   OBJECTIVE:   DIAGNOSTIC FINDINGS: none in chart  PATIENT SURVEYS:  Primary score 48% with goal of 65% 01/09/23: 57%  COGNITION: Overall cognitive status: Within functional limits for tasks assessed     SENSATION: WFL    MUSCLE LENGTH: Hamstrings: full in seated knee extension   POSTURE: flexed trunk  and weight shift right  PALPATION: Tenderness Right patellar mobilizations  LOWER EXTREMITY ROM:  Active ROM Right eval Left eval Right  12/12/22 Left 12/12/22  Hip flexion      Hip extension      Hip abduction      Hip adduction      Hip internal rotation      Hip external rotation      Knee flexion 90 114 110   Knee extension 0 Arom -10/ passive 0  AROM -5  Ankle dorsiflexion      Ankle plantarflexion      Ankle inversion      Ankle eversion       (Blank rows = not tested)  LOWER EXTREMITY MMT:  MMT Right eval Left eval  Right  01/09/23 Left 01/09/23  Hip flexion 28.8 53.0 26.5 34.7  Hip extension      Hip abduction 35.5 46.1 40.2 39.1  Hip adduction      Hip internal rotation      Hip external rotation      Knee flexion      Knee extension 31.7 30.2 44.5 34.1  Ankle dorsiflexion      Ankle plantarflexion      Ankle inversion      Ankle eversion       (Blank rows = not tested)  LOWER EXTREMITY SPECIAL TESTS:  Knee special tests: Patellafemoral grind test: positive  bilatterally  FUNCTIONAL TESTS:  30 s STS: 3 using UE Timed up and go (TUG): 15.38 4  Stage balance passed NBOS, semi tandem and tandem. SLS x 2 s  01/09/23:   TUG: 6.52s SLS: LLE 6.52s, RLE 6.70s  GAIT: Distance walked: 500 ft Assistive device utilized: None Level of assistance: Complete Independence Comments: Off loading right, slowed cadence/antalgic, right knee valgus deformity.   TODAY'S TREATMENT:                                                                                                                              Pt seen for aquatic therapy today.  Treatment took place in water 3.5-4.75 ft in depth at the [facility] pool. Temp of water was 91.  Pt entered/exited the pool via stairs using step to pattern with hand rail.  * MMT, TUG, SLS, FOTO- for assessing goals *UE support with barbell: walking forward, back and side stepping in 4.38ft  multiple widths * marching  * bilat rainbow hand floats under water at side: high knee marching in place * UE support on rainbow hand floats:  3 way toe touch x 5 reps (difficult in Rt stance);  heel raises x 10; hip abdct/ addct x 10 each * return to walking with increased speed  with rainbow hand floats  *STS from bench in water with feet on blue step, with cues for controlled descent, forward arm reach. X 5    Pt requires the buoyancy and hydrostatic pressure of water for support, and to offload joints by unweighting joint load by at least 50 % in navel deep water and  by at least 75-80% in chest to neck deep water.  Viscosity of the water is needed for resistance of strengthening. Water current perturbations provides challenge to standing balance requiring increased core activation.     PATIENT EDUCATION:  Education details: aquatic exercise progressions/modifications   Person educated: Patient Education method: Explanation Education comprehension: verbalized understanding  HOME EXERCISE PROGRAM: TBA  ASSESSMENT:  CLINICAL IMPRESSION: Pt observed as guarded initially at 4 ft, but with increased time in water and cues was able to relax and walk with less rigid LEs.  She tolerated all exercises well, without production of pain.  She continues to be challenged with SLS exercises.  Minor improvement in balance since evaluation.  Pt has partially met her goals and will benefit from continued PT intervention to maximize functional mobility.  Pt to look into local pool for continuation of exercise outside of therapy sessions. Will plan to create HEP for pt to utilize once she chooses facility.     Initial assessment: Patient is a 62 y.o. F who was seen today for physical therapy evaluation and treatment for Right (bilateral) knee pain. She has recently lost 85lb and qualifies for the surgical intervention but is wanting to delay it if able until next summer.  She has had a partial knee replacement in left in past and presently Left knee pain >right.  She had a steroid injection in the right recently. She presents amb without  an AD offloading to right.  Valgus deformity right knee present. Functional testing demonstrates a decrease lower extremity strength, transitional movements, and balance. She will benefit from skilled physical therapy intervention initially in aquatic setting using the properties of water to regain strength and movement while decreasing pain then transition to land based for progressive strengthening to maximize function and  mobility.  OBJECTIVE IMPAIRMENTS: Abnormal gait, decreased balance, decreased mobility, difficulty walking, decreased ROM, decreased strength, obesity, and pain.   ACTIVITY LIMITATIONS: lifting, sitting, standing, squatting, stairs, and locomotion level  PERSONAL FACTORS: Past/current experiences, Time since onset of injury/illness/exacerbation, and 1 comorbidity: obesity  are also affecting patient's functional outcome.   REHAB POTENTIAL: Good  CLINICAL DECISION MAKING: Evolving/moderate complexity  EVALUATION COMPLEXITY: Moderate   GOALS: Goals reviewed with patient? Yes  SHORT TERM GOALS: Target date: 12/08/22 Pt will tolerate full aquatic sessions consistently without increase in pain and with improving function to demonstrate good toleration and effectiveness of intervention.  Baseline: Goal status: In progress - 01/09/23  2.  Pt will improve on Tug test to <or= 12s to demonstrate improvement in lower extremity function, mobility and decreased fall risk. Baseline: 15.38 Goal status: Met - 01/09/23  3.  Pt will report decrease in pain to <2/10 left knee for improved toleration to activity/quality of life and to demonstrate improved management of pain.  Baseline: see chart Goal status: In progress   4.  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  Pt will be indep with final HEP's (land and aquatic as appropriate) for continued management of condition Baseline:  Goal status: INITIAL  3.  Pt will improve strength in all areas listed by 10lb or > to demonstrate improved overall physical function Baseline: see chart Goal status: In progress - 01/09/23  4.  Pt will improve on 30s STS test to <or= 7 to  demonstrate improving functional lower extremity strength, transitional movements, and balance Baseline: 3 Goal status: INITIAL  5.  Pt will report toleration to amb improved to up towards 1 hour without limitation of knee pain Baseline:  Goal status: INITIAL  6.  Pt will improve ROM right knee flex by at least 10 degrees to improve function Baseline: see chart Goal status: met - 12/12/22   PLAN:  PT FREQUENCY: 1-2x/week  PT DURATION: 8 weeks  PLANNED INTERVENTIONS: Therapeutic exercises, Therapeutic activity, Neuromuscular re-education, Balance training, Gait training, Patient/Family education, Self Care, Joint mobilization, Joint manipulation, Stair training, Orthotic/Fit training, DME instructions, Aquatic Therapy, Electrical stimulation, Cryotherapy, Moist heat, Taping, Ionotophoresis 4mg /ml Dexamethasone, Manual therapy, and Re-evaluation  PLAN FOR NEXT SESSION: Aquatic: strengthening and ROM, balance retraining, gait.  Land: progressive strengthening, stair climbing, STS transfers, Gait (w/without AD), HEP  Mayer Camel, PTA 01/09/23 1:04 PM [facility] [address] [place], [place], [address] Phone: [phone]   Fax:  [phone]  Addend Rushie Chestnut) Ziemba MPT 01/22/23  1238pm

## 2023-01-12 ENCOUNTER — Ambulatory Visit (HOSPITAL_BASED_OUTPATIENT_CLINIC_OR_DEPARTMENT_OTHER): Payer: BC Managed Care – PPO | Admitting: Physical Therapy

## 2023-01-22 ENCOUNTER — Ambulatory Visit (HOSPITAL_BASED_OUTPATIENT_CLINIC_OR_DEPARTMENT_OTHER): Payer: BC Managed Care – PPO | Admitting: Physical Therapy

## 2023-01-29 ENCOUNTER — Ambulatory Visit (HOSPITAL_BASED_OUTPATIENT_CLINIC_OR_DEPARTMENT_OTHER): Payer: BC Managed Care – PPO | Admitting: Physical Therapy

## 2023-02-06 ENCOUNTER — Ambulatory Visit (HOSPITAL_BASED_OUTPATIENT_CLINIC_OR_DEPARTMENT_OTHER): Payer: BC Managed Care – PPO | Admitting: Physical Therapy

## 2023-02-08 ENCOUNTER — Ambulatory Visit (HOSPITAL_BASED_OUTPATIENT_CLINIC_OR_DEPARTMENT_OTHER): Payer: BC Managed Care – PPO | Admitting: Physical Therapy

## 2023-04-11 ENCOUNTER — Ambulatory Visit
Admission: EM | Admit: 2023-04-11 | Discharge: 2023-04-11 | Disposition: A | Discharge status: Home / Self Care | Payer: 59

## 2023-04-11 DIAGNOSIS — J069 Acute upper respiratory infection, unspecified: Secondary | ICD-10-CM

## 2023-04-11 MED ORDER — AZITHROMYCIN 250 MG PO TABS: 250.0000 mg | ORAL_TABLET | Freq: Every day | ORAL | 0 refills | Status: DC

## 2023-04-11 MED ORDER — ALBUTEROL SULFATE HFA 108 (90 BASE) MCG/ACT IN AERS: 1.0000 | INHALATION_SPRAY | Freq: Four times a day (QID) | RESPIRATORY_TRACT | 0 refills | Status: AC | PRN

## 2023-04-11 NOTE — ED Provider Notes (Signed)
 [facility]    CSN: [id number] Arrival date & time: 04/11/23  1050      History   Chief Complaint Chief Complaint  Patient presents with   URI    HPI [name] is a 63 y.o. female.  Patient presents with 1 week history of congestion, postnasal drip, and cough.  She has been using her albuterol  inhaler that was prescribed last year.  She has been taking OTC cold medication also.  She denies fever, chest pain, shortness of breath.  Her medical history includes asthma, hypertension, renal insufficiency, prediabetes.  The history is provided by the patient and medical records.    Past Medical History:  Diagnosis Date   Hypertension    followed by pcp   Incisional hernia    OA (osteoarthritis)    Pre-diabetes     Patient Active Problem List   Diagnosis Date Noted   Morbid obesity (HCC) 05/03/2021   Primary osteoarthritis of left knee 04/20/2014   Obesity (BMI 30-39.9) 02/06/2014   Anemia 08/08/2013   Asthma 08/08/2013   Bilateral knee pain 08/08/2013   Right foot pain 08/08/2013   Essential hypertension 06/23/2013   S/P hysterectomy - Davinci  10/07/2012   Menorrhagia with fibroids 10/07/2012    Past Surgical History:  Procedure Laterality Date   BUNIONECTOMY Right 2020   great toe bunionectomy/  3rd toe pinning of fracture   CARPAL TUNNEL RELEASE Right 1994   HERNIA REPAIR  1996   HIATAL HERNIA REPAIR N/A 05/03/2021   Procedure: HERNIA REPAIR HIATAL;  Surgeon: Signe Mitzie LABOR, MD;  Location: [facility];  Service: General;  Laterality: N/A;   INCISIONAL HERNIA REPAIR N/A 05/19/2020   Procedure: OPEN HERNIA REPAIR INCISIONAL;  Surgeon: Vernetta Berg, MD;  Location: [facility];  Service: General;  Laterality: N/A;   LAPAROSCOPIC BILATERAL SALPINGECTOMY Bilateral 10/07/2012   Procedure: LAPAROSCOPIC BILATERAL SALPINGECTOMY;  Surgeon: Dickie LABOR Carder, MD;  Location: [facility];  Service: Gynecology;  Laterality: Bilateral;   LAPAROSCOPIC  GASTRIC SLEEVE RESECTION N/A 05/03/2021   Procedure: LAPAROSCOPIC GASTRIC SLEEVE RESECTION;  Surgeon: Signe Mitzie LABOR, MD;  Location: [facility];  Service: General;  Laterality: N/A;   PARTIAL KNEE ARTHROPLASTY Left    ROBOTIC ASSISTED LAPAROSCOPIC LYSIS OF ADHESION N/A 10/07/2012   Procedure: ROBOTIC ASSISTED LAPAROSCOPIC LYSIS OF ADHESION;  Surgeon: Dickie LABOR Carder, MD;  Location: [facility];  Service: Gynecology;  Laterality: N/A;   ROBOTIC ASSISTED TOTAL HYSTERECTOMY N/A 10/07/2012   Procedure: ROBOTIC ASSISTED TOTAL HYSTERECTOMY;  Surgeon: Dickie LABOR Carder, MD;  Location: [facility];  Service: Gynecology;  Laterality: N/A;   UPPER GI ENDOSCOPY N/A 05/03/2021   Procedure: UPPER GI ENDOSCOPY;  Surgeon: Signe Mitzie LABOR, MD;  Location: [facility];  Service: General;  Laterality: N/A;    OB History   No obstetric history on file.      Home Medications    Prior to Admission medications   Medication Sig Start Date End Date Taking? Authorizing Provider  albuterol  (VENTOLIN  HFA) 108 (90 Base) MCG/ACT inhaler Inhale 1-2 puffs into the lungs every 6 (six) hours as needed. 04/11/23  Yes Corlis Burnard DEL, NP  azithromycin  (ZITHROMAX ) 250 MG tablet Take 1 tablet (250 mg total) by mouth daily. Take first 2 tablets together, then 1 every day until finished. 04/11/23  Yes Corlis Burnard DEL, NP  amLODipine  (NORVASC ) 10 MG tablet Take 10 mg by mouth daily.    [provider]  amLODipine  (NORVASC ) 5 MG tablet Take 5 mg  by mouth daily.    [provider]  fluticasone  (FLONASE ) 50 MCG/ACT nasal spray Place 1 spray into both nostrils daily. 12/02/22   Hazen Darryle BRAVO, FNP  gabapentin  (NEURONTIN ) 100 MG capsule Take 2 capsules (200 mg total) by mouth every 12 (twelve) hours. Patient not taking: Reported on 04/11/2023 05/04/21   Signe Mitzie LABOR, MD  pantoprazole  (PROTONIX ) 40 MG tablet Take 1 tablet (40 mg total) by mouth daily. Patient not taking: Reported on 04/11/2023 05/04/21   Signe Mitzie LABOR, MD     Family History Family History  Family history unknown: Yes    Social History Social History   Tobacco Use   Smoking status: Never   Smokeless tobacco: Never  Vaping Use   Vaping status: Never Used  Substance Use Topics   Alcohol use: No   Drug use: Never     Allergies   Aspirin, Codeine, Cyclobenzaprine, Ibuprofen, Penicillins, and Latex   Review of Systems Review of Systems  Constitutional:  Negative for chills and fever.  HENT:  Positive for congestion, postnasal drip and rhinorrhea. Negative for ear pain and sore throat.   Respiratory:  Positive for cough. Negative for shortness of breath.   Cardiovascular:  Negative for chest pain and palpitations.     Physical Exam Triage Vital Signs ED Triage Vitals  Encounter Vitals Group     BP --      Systolic BP Percentile --      Diastolic BP Percentile --      Pulse Rate 04/11/23 1219 89     Resp 04/11/23 1219 18     Temp 04/11/23 1219 98 F (36.7 C)     Temp src --      SpO2 04/11/23 1219 100 %     Weight --      Height --      Head Circumference --      Peak Flow --      Pain Score 04/11/23 1220 5     Pain Loc --      Pain Education --      Exclude from Growth Chart --    No data found.  Updated Vital Signs BP 122/82   Pulse 89   Temp 98 F (36.7 C)   Resp 18   LMP 10/01/2012   SpO2 100%   Visual Acuity Right Eye Distance:   Left Eye Distance:   Bilateral Distance:    Right Eye Near:   Left Eye Near:    Bilateral Near:     Physical Exam Constitutional:      General: She is not in acute distress. HENT:     Right Ear: Tympanic membrane normal.     Left Ear: Tympanic membrane normal.     Nose: Congestion and rhinorrhea present.     Mouth/Throat:     Mouth: Mucous membranes are moist.     Pharynx: Oropharynx is clear.  Cardiovascular:     Rate and Rhythm: Normal rate and regular rhythm.     Heart sounds: Normal heart sounds.  Pulmonary:     Effort: Pulmonary effort is normal. No  respiratory distress.     Breath sounds: Normal breath sounds. No wheezing.  Neurological:     Mental Status: She is alert.      UC Treatments / Results  Labs (all labs ordered are listed, but only abnormal results are displayed) Labs Reviewed - No data to display  EKG   Radiology No results found.  Procedures  Procedures (including critical care time)  Medications Ordered in UC Medications - No data to display  Initial Impression / Assessment and Plan / UC Course  I have reviewed the triage vital signs and the nursing notes.  Pertinent labs & imaging results that were available during my care of the patient were reviewed by me and considered in my medical decision making (see chart for details).    Acute upper respiratory infection.  Patient has been symptomatic for 7 days.  O2 sat 100% on room air.  Treating today with albuterol  inhaler and Zithromax .  Education provided on acute URI.  Instructed patient to follow-up with her PCP if she is not improving.  She agrees to plan of care.  Final Clinical Impressions(s) / UC Diagnoses   Final diagnoses:  Acute upper respiratory infection     Discharge Instructions      Take the Zithromax  and use the albuterol  inhaler as directed.  Follow-up with your primary care provider if your symptoms are not improving.      ED Prescriptions     Medication Sig Dispense Auth. Provider   albuterol  (VENTOLIN  HFA) 108 (90 Base) MCG/ACT inhaler Inhale 1-2 puffs into the lungs every 6 (six) hours as needed. 18 g Corlis Burnard DEL, NP   azithromycin  (ZITHROMAX ) 250 MG tablet Take 1 tablet (250 mg total) by mouth daily. Take first 2 tablets together, then 1 every day until finished. 6 tablet Corlis Burnard DEL, NP      PDMP not reviewed this encounter.   Corlis Burnard DEL, NP 04/11/23 1249

## 2023-04-11 NOTE — ED Triage Notes (Addendum)
 Patient to Urgent Care with complaints of nasal congestion/ cough (productive, discolored mucus). Denies any known fevers. Wheezing/ SHOB.   Symptoms x1 week.  Meds: coricidin. Mucinex/ albuterol/ inhaler

## 2023-04-11 NOTE — Discharge Instructions (Addendum)
Take the Zithromax and use the albuterol inhaler as directed.  Follow up with your primary care provider if your symptoms are not improving.   ° °

## 2023-08-07 ENCOUNTER — Encounter: Payer: Self-pay | Admitting: Emergency Medicine

## 2023-08-07 ENCOUNTER — Ambulatory Visit: Admission: EM | Admit: 2023-08-07 | Discharge: 2023-08-07 | Disposition: A | Discharge status: Home / Self Care

## 2023-08-07 DIAGNOSIS — H1013 Acute atopic conjunctivitis, bilateral: Secondary | ICD-10-CM

## 2023-08-07 MED ORDER — OLOPATADINE HCL 0.2 % OP SOLN: 1.0000 [drp] | Freq: Every day | OPHTHALMIC | 0 refills | Status: AC | PRN

## 2023-08-07 NOTE — ED Triage Notes (Signed)
 Pt presents c/o eye burning. Pt states he feels like debris is on her eyeballs. She wants her eyes washed out until she is able to see an ophthalmologist.

## 2023-08-07 NOTE — ED Provider Notes (Signed)
 [facility]    CSN: [id number] Arrival date & time: 08/07/23  1816      History   Chief Complaint Chief Complaint  Patient presents with   Eye Problem    HPI [name] is a 63 y.o. female.    Eye Problem Patient with a history of hypertension and prediabetes presents today with bilateral ear irritation and burning.  She reports symptoms started about a week ago.  [employer] and reports that she has been out side a lot and working more extended hours and is concerned that the following day be attributing to her symptoms.  She is also having some allergic rhinitis symptoms.  Currently using Flonase .  Denies any visual acuity changes.  She is not seen an eye doctor in several years and is overdue for an eye exam.  Denies any other concerns.  Past Medical History:  Diagnosis Date   Hypertension    followed by pcp   Incisional hernia    OA (osteoarthritis)    Pre-diabetes     Patient Active Problem List   Diagnosis Date Noted   Primary osteoarthritis of both knees 07/04/2022   Morbid obesity (HCC) 05/03/2021   Vitamin D deficiency 01/12/2021   Mixed hyperlipidemia 09/23/2020   Renal insufficiency 08/21/2019   Primary osteoarthritis of left knee 04/20/2014   Obesity (BMI 30-39.9) 02/06/2014   Anemia 08/08/2013   Asthma 08/08/2013   Bilateral knee pain 08/08/2013   Right foot pain 08/08/2013   Essential hypertension 06/23/2013   S/P hysterectomy - Davinci  10/07/2012   Menorrhagia with fibroids 10/07/2012    Past Surgical History:  Procedure Laterality Date   BUNIONECTOMY Right 2020   great toe bunionectomy/  3rd toe pinning of fracture   CARPAL TUNNEL RELEASE Right 1994   HERNIA REPAIR  1996   HIATAL HERNIA REPAIR N/A 05/03/2021   Procedure: HERNIA REPAIR HIATAL;  Surgeon: Adalberto Acton, MD;  Location: [facility];  Service: General;  Laterality: N/A;   INCISIONAL HERNIA REPAIR N/A 05/19/2020   Procedure: OPEN HERNIA REPAIR INCISIONAL;   Surgeon: Oza Blumenthal, MD;  Location: [facility];  Service: General;  Laterality: N/A;   LAPAROSCOPIC BILATERAL SALPINGECTOMY Bilateral 10/07/2012   Procedure: LAPAROSCOPIC BILATERAL SALPINGECTOMY;  Surgeon: Kandra Orn, MD;  Location: [facility];  Service: Gynecology;  Laterality: Bilateral;   LAPAROSCOPIC GASTRIC SLEEVE RESECTION N/A 05/03/2021   Procedure: LAPAROSCOPIC GASTRIC SLEEVE RESECTION;  Surgeon: Adalberto Acton, MD;  Location: [facility];  Service: General;  Laterality: N/A;   PARTIAL KNEE ARTHROPLASTY Left    ROBOTIC ASSISTED LAPAROSCOPIC LYSIS OF ADHESION N/A 10/07/2012   Procedure: ROBOTIC ASSISTED LAPAROSCOPIC LYSIS OF ADHESION;  Surgeon: Kandra Orn, MD;  Location: [facility];  Service: Gynecology;  Laterality: N/A;   ROBOTIC ASSISTED TOTAL HYSTERECTOMY N/A 10/07/2012   Procedure: ROBOTIC ASSISTED TOTAL HYSTERECTOMY;  Surgeon: Kandra Orn, MD;  Location: [facility];  Service: Gynecology;  Laterality: N/A;   UPPER GI ENDOSCOPY N/A 05/03/2021   Procedure: UPPER GI ENDOSCOPY;  Surgeon: Adalberto Acton, MD;  Location: [facility];  Service: General;  Laterality: N/A;    OB History   No obstetric history on file.      Home Medications    Prior to Admission medications   Medication Sig Start Date End Date Taking? Authorizing Provider  erythromycin ophthalmic ointment 1 Application. 02/16/23  Yes [provider]  Ketoprofen (FROTEK) 10 % CREA Apply 1 Application topically. 11/03/22  Yes [provider]  losartan (COZAAR) 50 MG tablet Take 50 mg by mouth. 02/20/23  Yes [provider]  Olopatadine HCl 0.2 % SOLN Apply 1 drop to eye daily as needed (eye irritation and burning). 08/07/23  Yes Buena Carmine, NP  albuterol  (VENTOLIN  HFA) 108 (90 Base) MCG/ACT inhaler Inhale 1-2 puffs into the lungs every 6 (six) hours as needed. 04/11/23   Wellington Half, NP  amLODipine  (NORVASC ) 10 MG tablet Take 10 mg by mouth daily.     [provider]  amLODipine  (NORVASC ) 5 MG tablet Take 5 mg by mouth daily.    [provider]  azithromycin  (ZITHROMAX ) 250 MG tablet Take 1 tablet (250 mg total) by mouth daily. Take first 2 tablets together, then 1 every day until finished. 04/11/23   Wellington Half, NP  calcium carbonate 100 mg/ml SUSP Take by mouth.    [provider]  fluticasone  (FLONASE ) 50 MCG/ACT nasal spray Place 1 spray into both nostrils daily. 12/02/22   Dodson Freestone, FNP  gabapentin  (NEURONTIN ) 100 MG capsule Take 2 capsules (200 mg total) by mouth every 12 (twelve) hours. Patient not taking: Reported on 04/11/2023 05/04/21   Adalberto Acton, MD  pantoprazole  (PROTONIX ) 40 MG tablet Take 1 tablet (40 mg total) by mouth daily. Patient not taking: Reported on 04/11/2023 05/04/21   Adalberto Acton, MD    Family History Family History  Family history unknown: Yes    Social History Social History   Tobacco Use   Smoking status: Never   Smokeless tobacco: Never  Vaping Use   Vaping status: Never Used  Substance Use Topics   Alcohol use: No   Drug use: Never     Allergies   Aspirin, Codeine, Cyclobenzaprine, Ibuprofen, Penicillins, and Latex   Review of Systems Review of Systems Pertinent negatives listed in HPI   Physical Exam Triage Vital Signs ED Triage Vitals  Encounter Vitals Group     BP 08/07/23 1834 126/86     Systolic BP Percentile --      Diastolic BP Percentile --      Pulse Rate 08/07/23 1834 80     Resp 08/07/23 1834 18     Temp 08/07/23 1834 98 F (36.7 C)     Temp Source 08/07/23 1834 Oral     SpO2 08/07/23 1834 96 %     Weight 08/07/23 1833 203 lb 14.8 oz (92.5 kg)     Height --      Head Circumference --      Peak Flow --      Pain Score 08/07/23 1832 5     Pain Loc --      Pain Education --      Exclude from Growth Chart --    No data found.  Updated Vital Signs BP 126/86 (BP Location: Left Arm)   Pulse 80   Temp 98 F (36.7 C) (Oral)    Resp 18   Wt 203 lb 14.8 oz (92.5 kg)   LMP 10/01/2012   SpO2 96%   BMI 31.94 kg/m   Visual Acuity Right Eye Distance:   Left Eye Distance:   Bilateral Distance:    Right Eye Near:   Left Eye Near:    Bilateral Near:     Physical Exam Vitals reviewed.  Constitutional:      Appearance: Normal appearance.  HENT:     Head: Normocephalic and atraumatic.     Nose: Congestion and rhinorrhea present.  Eyes:  General:        Right eye: No discharge.        Left eye: No discharge.     Extraocular Movements: Extraocular movements intact.     Pupils: Pupils are equal, round, and reactive to light.     Comments: On evaluation patient conjunctiva eyes appear dry no visible foreign body or swelling of the upper or lower eyelids.  Sclera no erythema or jaundice  Cardiovascular:     Rate and Rhythm: Normal rate and regular rhythm.  Pulmonary:     Effort: Pulmonary effort is normal.     Breath sounds: Normal breath sounds.  Skin:    General: Skin is warm and dry.  Neurological:     General: No focal deficit present.     Mental Status: She is alert.      UC Treatments / Results  Labs (all labs ordered are listed, but only abnormal results are displayed) Labs Reviewed - No data to display  EKG   Radiology No results found.  Procedures Procedures (including critical care time)  Medications Ordered in UC Medications - No data to display  Initial Impression / Assessment and Plan / UC Course  I have reviewed the triage vital signs and the nursing notes.  Pertinent labs & imaging results that were available during my care of the patient were reviewed by me and considered in my medical decision making (see chart for details).    Atopic conjunctivitis of both eyes, treatment with antihistamine Pataday 1 drop in each eye daily as needed for irritation and burning.  Patient given patient to follow-up at [facility] for formal eye exam.  Verbalized understanding  and agreement with plan. Final Clinical Impressions(s) / UC Diagnoses   Final diagnoses:  Acute atopic conjunctivitis of both eyes   Discharge Instructions   None    ED Prescriptions     Medication Sig Dispense Auth. Provider   Olopatadine HCl 0.2 % SOLN Apply 1 drop to eye daily as needed (eye irritation and burning). 2.5 mL Buena Carmine, NP      PDMP not reviewed this encounter.   Buena Carmine, NP 08/07/23 1907

## 2023-11-15 ENCOUNTER — Ambulatory Visit: Admission: EM | Admit: 2023-11-15 | Discharge: 2023-11-15 | Disposition: A | Discharge status: Home / Self Care

## 2023-11-15 DIAGNOSIS — N951 Menopausal and female climacteric states: Secondary | ICD-10-CM | POA: Insufficient documentation

## 2023-11-15 DIAGNOSIS — R519 Headache, unspecified: Secondary | ICD-10-CM | POA: Diagnosis not present

## 2023-11-15 DIAGNOSIS — K0889 Other specified disorders of teeth and supporting structures: Secondary | ICD-10-CM

## 2023-11-15 DIAGNOSIS — R7303 Prediabetes: Secondary | ICD-10-CM | POA: Insufficient documentation

## 2023-11-15 DIAGNOSIS — E88819 Insulin resistance, unspecified: Secondary | ICD-10-CM | POA: Insufficient documentation

## 2023-11-15 DIAGNOSIS — M2550 Pain in unspecified joint: Secondary | ICD-10-CM | POA: Insufficient documentation

## 2023-11-15 MED ORDER — KETOROLAC TROMETHAMINE 30 MG/ML IJ SOLN
30.0000 mg | Freq: Once | INTRAMUSCULAR | Status: AC
  Administered 2023-11-15: 30 mg via INTRAMUSCULAR

## 2023-11-15 MED ORDER — CLINDAMYCIN HCL 150 MG PO CAPS: 150.0000 mg | ORAL_CAPSULE | Freq: Four times a day (QID) | ORAL | 0 refills | Status: AC

## 2023-11-15 NOTE — ED Provider Notes (Addendum)
 [facility]    CSN: [id number] Arrival date & time: 11/15/23  1558      History   Chief Complaint Chief Complaint  Patient presents with   Headache   Dental Problem    HPI [name] is a 63 y.o. female.   Here today for evaluation of possible abscess to her upper midline.  She reports that she has had abscess seen in the past and was treated by dentist however she accidentally cut the roof of her mouth in that same area with a chip few days ago.  She reports that she has had some swelling to the area as well as a bad taste in her mouth and she is concerned with additional infection and would like treated for same.  She also request injection for migraine that has developed.  She reports that she has pressure behind her eyes mainly on the right.  This is typical for her migraines.  The history is provided by the patient.  Headache Associated symptoms: no abdominal pain, no fever, no nausea and no vomiting     Past Medical History:  Diagnosis Date   Hypertension    followed by pcp   Incisional hernia    OA (osteoarthritis)    Pre-diabetes     Patient Active Problem List   Diagnosis Date Noted   Insulin resistance 11/15/2023   Joint pain 11/15/2023   Menopausal and female climacteric states 11/15/2023   Prediabetes 11/15/2023   Primary osteoarthritis of both knees 07/04/2022   Morbid obesity (HCC) 05/03/2021   Vitamin D deficiency 01/12/2021   Mixed hyperlipidemia 09/23/2020   Renal insufficiency 08/21/2019   Primary osteoarthritis of left knee 04/20/2014   Body mass index (BMI) 45.0-49.9, adult (HCC) 02/06/2014   Anemia 08/08/2013   Asthma 08/08/2013   Bilateral knee pain 08/08/2013   Right foot pain 08/08/2013   Primary hypertension 06/23/2013   S/P hysterectomy - Davinci  10/07/2012   Menorrhagia with fibroids 10/07/2012    Past Surgical History:  Procedure Laterality Date   BUNIONECTOMY Right 2020   great toe bunionectomy/  3rd toe  pinning of fracture   CARPAL TUNNEL RELEASE Right 1994   HERNIA REPAIR  1996   HIATAL HERNIA REPAIR N/A 05/03/2021   Procedure: HERNIA REPAIR HIATAL;  Surgeon: Signe Mitzie LABOR, MD;  Location: [facility];  Service: General;  Laterality: N/A;   INCISIONAL HERNIA REPAIR N/A 05/19/2020   Procedure: OPEN HERNIA REPAIR INCISIONAL;  Surgeon: Vernetta Berg, MD;  Location: [facility] [place];  Service: General;  Laterality: N/A;   LAPAROSCOPIC BILATERAL SALPINGECTOMY Bilateral 10/07/2012   Procedure: LAPAROSCOPIC BILATERAL SALPINGECTOMY;  Surgeon: Dickie LABOR Carder, MD;  Location: [facility];  Service: Gynecology;  Laterality: Bilateral;   LAPAROSCOPIC GASTRIC SLEEVE RESECTION N/A 05/03/2021   Procedure: LAPAROSCOPIC GASTRIC SLEEVE RESECTION;  Surgeon: Signe Mitzie LABOR, MD;  Location: [facility];  Service: General;  Laterality: N/A;   PARTIAL KNEE ARTHROPLASTY Left    ROBOTIC ASSISTED LAPAROSCOPIC LYSIS OF ADHESION N/A 10/07/2012   Procedure: ROBOTIC ASSISTED LAPAROSCOPIC LYSIS OF ADHESION;  Surgeon: Dickie LABOR Carder, MD;  Location: [facility];  Service: Gynecology;  Laterality: N/A;   ROBOTIC ASSISTED TOTAL HYSTERECTOMY N/A 10/07/2012   Procedure: ROBOTIC ASSISTED TOTAL HYSTERECTOMY;  Surgeon: Dickie LABOR Carder, MD;  Location: [facility];  Service: Gynecology;  Laterality: N/A;   UPPER GI ENDOSCOPY N/A 05/03/2021   Procedure: UPPER GI ENDOSCOPY;  Surgeon: Signe Mitzie LABOR, MD;  Location: [facility];  Service: General;  Laterality:  N/A;    OB History   No obstetric history on file.      Home Medications    Prior to Admission medications   Medication Sig Start Date End Date Taking? Authorizing Provider  calcium carbonate (SUPER CALCIUM) 1500 (600 Ca) MG TABS tablet Take 1 tablet by mouth daily. 02/09/22  Yes [provider]  clindamycin  (CLEOCIN ) 150 MG capsule Take 1 capsule (150 mg total) by mouth every 6 (six) hours. 11/15/23  Yes Billy Asberry FALCON, PA-C  Multiple Vitamin (QUINTABS) TABS  Take 1 tablet by mouth daily. 02/09/22  Yes [provider]  predniSONE (DELTASONE) 10 MG tablet Take 6 tabs x 1 day, then 5 tabs x 1 day, then 4 tabs x 1 day, then 3 tabs x 1 day, then 2 tabs x 1 day, then 1 tab x 1 day, then stop 04/17/23  Yes [provider]  senna-docusate (SENOKOT-S) 8.6-50 MG tablet Take 2 tablets by mouth. 04/01/15  Yes [provider]  albuterol  (VENTOLIN  HFA) 108 (90 Base) MCG/ACT inhaler Inhale 1-2 puffs into the lungs every 6 (six) hours as needed. 04/11/23   Corlis Burnard DEL, NP  amLODipine  (NORVASC ) 10 MG tablet Take 10 mg by mouth daily.    [provider]  amLODipine  (NORVASC ) 5 MG tablet Take 5 mg by mouth daily.    [provider]  azithromycin  (ZITHROMAX ) 250 MG tablet Take 1 tablet (250 mg total) by mouth daily. Take first 2 tablets together, then 1 every day until finished. 04/11/23   Corlis Burnard DEL, NP  calcium carbonate 100 mg/ml SUSP Take by mouth.    [provider]  erythromycin ophthalmic ointment 1 Application. 02/16/23   [provider]  fluticasone  (FLONASE ) 50 MCG/ACT nasal spray Place 1 spray into both nostrils daily. 12/02/22   Mound, Haley E, FNP  furosemide (LASIX) 20 MG tablet TAKE 1 TABLET BY MOUTH ONCE DAILY AS NEEDED Oral; Duration: 30    [provider]  gabapentin  (NEURONTIN ) 100 MG capsule Take 2 capsules (200 mg total) by mouth every 12 (twelve) hours. Patient not taking: Reported on 04/11/2023 05/04/21   Signe Mitzie LABOR, MD  Ketoprofen (FROTEK) 10 % CREA Apply 1 Application topically. 11/03/22   [provider]  lisinopril (ZESTRIL) 40 MG tablet TAKE 1 TABLET BY MOUTH ONCE DAILY Oral; Duration: 90    [provider]  losartan (COZAAR) 50 MG tablet Take 50 mg by mouth. 02/20/23   [provider]  Olopatadine  HCl 0.2 % SOLN Apply 1 drop to eye daily as needed (eye irritation and burning). 08/07/23   Arloa Suzen RAMAN, NP  pantoprazole  (PROTONIX ) 40 MG tablet  Take 1 tablet (40 mg total) by mouth daily. Patient not taking: Reported on 04/11/2023 05/04/21   Signe Mitzie LABOR, MD  pravastatin (PRAVACHOL) 20 MG tablet TAKE 1 TABLET BY MOUTH ONCE DAILY Oral; Duration: 90    [provider]    Family History Family History  Problem Relation Age of Onset   Cancer Mother     Social History Social History   Tobacco Use   Smoking status: Never   Smokeless tobacco: Never  Vaping Use   Vaping status: Never Used  Substance Use Topics   Alcohol use: No   Drug use: Never     Allergies   Aspirin, Codeine, Cyclobenzaprine, Ibuprofen, Latex, and Penicillins   Review of Systems Review of Systems  Constitutional:  Negative for chills and fever.  HENT:  Positive for dental  problem.   Eyes:  Negative for discharge and redness.  Gastrointestinal:  Negative for abdominal pain, nausea and vomiting.  Neurological:  Positive for headaches.     Physical Exam Triage Vital Signs ED Triage Vitals  Encounter Vitals Group     BP 11/15/23 1624 136/89     Girls Systolic BP Percentile --      Girls Diastolic BP Percentile --      Boys Systolic BP Percentile --      Boys Diastolic BP Percentile --      Pulse Rate 11/15/23 1624 64     Resp 11/15/23 1624 20     Temp 11/15/23 1624 98.5 F (36.9 C)     Temp Source 11/15/23 1624 Oral     SpO2 11/15/23 1624 99 %     Weight 11/15/23 1621 203 lb 14.8 oz (92.5 kg)     Height 11/15/23 1621 5' 7 (1.702 m)     Head Circumference --      Peak Flow --      Pain Score 11/15/23 1616 7     Pain Loc --      Pain Education --      Exclude from Growth Chart --    No data found.  Updated Vital Signs BP 136/89 (BP Location: Left Arm)   Pulse 64   Temp 98.5 F (36.9 C) (Oral)   Resp 20   Ht 5' 7 (1.702 m)   Wt 203 lb 14.8 oz (92.5 kg)   LMP 10/01/2012   SpO2 99%   BMI 31.94 kg/m   Visual Acuity Right Eye Distance:   Left Eye Distance:   Bilateral Distance:    Right Eye Near:   Left Eye  Near:    Bilateral Near:     Physical Exam Vitals and nursing note reviewed.  Constitutional:      General: She is not in acute distress.    Appearance: Normal appearance. She is not ill-appearing.  HENT:     Head: Normocephalic and atraumatic.     Mouth/Throat:     Mouth: Mucous membranes are moist.     Pharynx: Oropharynx is clear.     Comments: Mild gingival swelling appreciated to both inner and outer gumline of left upper molars Eyes:     Extraocular Movements: Extraocular movements intact.     Conjunctiva/sclera: Conjunctivae normal.     Pupils: Pupils are equal, round, and reactive to light.  Cardiovascular:     Rate and Rhythm: Normal rate.  Pulmonary:     Effort: Pulmonary effort is normal. No respiratory distress.  Neurological:     Mental Status: She is alert.     Comments: Normal finger-nose, normal speech, no facial droop  Psychiatric:        Mood and Affect: Mood normal.        Behavior: Behavior normal.        Thought Content: Thought content normal.      UC Treatments / Results  Labs (all labs ordered are listed, but only abnormal results are displayed) Labs Reviewed - No data to display  EKG   Radiology No results found.  Procedures Procedures (including critical care time)  Medications Ordered in UC Medications  ketorolac  (TORADOL ) 30 MG/ML injection 30 mg (30 mg Intramuscular Given 11/15/23 1733)    Initial Impression / Assessment and Plan / UC Course  I have reviewed the triage vital signs and the nursing notes.  Pertinent labs & imaging results that were  available during my care of the patient were reviewed by me and considered in my medical decision making (see chart for details).    Injection administered in office for headache as requested.  Will treat with antibiotic that covers dental abscess.  Encouraged follow-up if no gradual improvement or with any further concerns.  Final Clinical Impressions(s) / UC Diagnoses   Final  diagnoses:  Pain, dental  Acute nonintractable headache, unspecified headache type   Discharge Instructions   None    ED Prescriptions     Medication Sig Dispense Auth. Provider   clindamycin  (CLEOCIN ) 150 MG capsule Take 1 capsule (150 mg total) by mouth every 6 (six) hours. 28 capsule Billy Asberry FALCON, PA-C      PDMP not reviewed this encounter.   Billy Asberry FALCON, PA-C 11/15/23 1912    Billy Asberry FALCON, PA-C 11/15/23 [phone]

## 2023-11-15 NOTE — ED Triage Notes (Signed)
 I [facility] to this some time ago and had a root canal, @ that time I had a possible abscess after, this was treated @ that time, again recently I was eating chips and I think it cut the gum/area at that same tooth, I bought some mouthwash but still leaving a funny taste in my mouth, I think it is infected again. I also wish to have another shot/medication that I have had in the past for pain due to a migraine I am getting form all this. This pain/flare up of this area started at it's worse again on Saturday.

## 2023-11-28 ENCOUNTER — Encounter (HOSPITAL_COMMUNITY): Payer: Self-pay | Admitting: *Deleted

## 2024-01-24 ENCOUNTER — Ambulatory Visit
Admission: EM | Admit: 2024-01-24 | Discharge: 2024-01-24 | Disposition: A | Discharge status: Home / Self Care | Attending: Family Medicine | Admitting: Family Medicine

## 2024-01-24 DIAGNOSIS — B351 Tinea unguium: Secondary | ICD-10-CM | POA: Diagnosis not present

## 2024-01-24 MED ORDER — TERBINAFINE HCL 250 MG PO TABS: 250.0000 mg | ORAL_TABLET | Freq: Every day | ORAL | 0 refills | Status: AC

## 2024-01-24 NOTE — ED Triage Notes (Addendum)
 Patient reports a fungal infection under the nail of the right index finger, which was noticed after recent nail pedicure by an esthetician. No fever is present. The patient has experienced a similar issue in the past. The area has been sore and painful.

## 2024-01-24 NOTE — Discharge Instructions (Signed)
 Terbinafine 250 mg tablets--take 1 daily for 6 weeks  Follow-up with primary care

## 2024-01-24 NOTE — ED Provider Notes (Signed)
 [facility]    CSN: [id number] Arrival date & time: 01/24/24  1241      History   Chief Complaint Chief Complaint  Patient presents with   Finger Problem    HPI [name] is a 63 y.o. female.   HPI Here for soreness of her right index finger at the fingernail.  She does had a manicure today and saw that the proximal nail was greenish. She is allergic to aspirin and codeine and does not tolerate cyclobenzaprine or ibuprofen and is allergic to penicillin also.  Past Medical History:  Diagnosis Date   Hypertension    followed by pcp   Incisional hernia    OA (osteoarthritis)    Pre-diabetes     Patient Active Problem List   Diagnosis Date Noted   Insulin resistance 11/15/2023   Joint pain 11/15/2023   Menopausal and female climacteric states 11/15/2023   Prediabetes 11/15/2023   Primary osteoarthritis of both knees 07/04/2022   Morbid obesity (HCC) 05/03/2021   Vitamin D deficiency 01/12/2021   Mixed hyperlipidemia 09/23/2020   Renal insufficiency 08/21/2019   Primary osteoarthritis of left knee 04/20/2014   Body mass index (BMI) 45.0-49.9, adult (HCC) 02/06/2014   Anemia 08/08/2013   Asthma 08/08/2013   Bilateral knee pain 08/08/2013   Right foot pain 08/08/2013   Primary hypertension 06/23/2013   S/P hysterectomy - Davinci  10/07/2012   Menorrhagia with fibroids 10/07/2012    Past Surgical History:  Procedure Laterality Date   BUNIONECTOMY Right 2020   great toe bunionectomy/  3rd toe pinning of fracture   CARPAL TUNNEL RELEASE Right 1994   HERNIA REPAIR  1996   HIATAL HERNIA REPAIR N/A 05/03/2021   Procedure: HERNIA REPAIR HIATAL;  Surgeon: Signe Mitzie LABOR, MD;  Location: [facility];  Service: General;  Laterality: N/A;   INCISIONAL HERNIA REPAIR N/A 05/19/2020   Procedure: OPEN HERNIA REPAIR INCISIONAL;  Surgeon: Vernetta Berg, MD;  Location: [facility] [place];  Service: General;  Laterality: N/A;   LAPAROSCOPIC BILATERAL  SALPINGECTOMY Bilateral 10/07/2012   Procedure: LAPAROSCOPIC BILATERAL SALPINGECTOMY;  Surgeon: Dickie LABOR Carder, MD;  Location: [facility];  Service: Gynecology;  Laterality: Bilateral;   LAPAROSCOPIC GASTRIC SLEEVE RESECTION N/A 05/03/2021   Procedure: LAPAROSCOPIC GASTRIC SLEEVE RESECTION;  Surgeon: Signe Mitzie LABOR, MD;  Location: [facility];  Service: General;  Laterality: N/A;   PARTIAL KNEE ARTHROPLASTY Left    ROBOTIC ASSISTED LAPAROSCOPIC LYSIS OF ADHESION N/A 10/07/2012   Procedure: ROBOTIC ASSISTED LAPAROSCOPIC LYSIS OF ADHESION;  Surgeon: Dickie LABOR Carder, MD;  Location: [facility];  Service: Gynecology;  Laterality: N/A;   ROBOTIC ASSISTED TOTAL HYSTERECTOMY N/A 10/07/2012   Procedure: ROBOTIC ASSISTED TOTAL HYSTERECTOMY;  Surgeon: Dickie LABOR Carder, MD;  Location: [facility];  Service: Gynecology;  Laterality: N/A;   UPPER GI ENDOSCOPY N/A 05/03/2021   Procedure: UPPER GI ENDOSCOPY;  Surgeon: Signe Mitzie LABOR, MD;  Location: [facility];  Service: General;  Laterality: N/A;    OB History   No obstetric history on file.      Home Medications    Prior to Admission medications   Medication Sig Start Date End Date Taking? Authorizing Provider  terbinafine (LAMISIL) 250 MG tablet Take 1 tablet (250 mg total) by mouth daily. 01/24/24 03/06/24 Yes Vonna Sharlet POUR, MD  albuterol  (VENTOLIN  HFA) 108 [phone] Base) MCG/ACT inhaler Inhale 1-2 puffs into the lungs every 6 (six) hours as needed. 04/11/23   Corlis Burnard DEL, NP  amLODipine  (NORVASC ) 10 MG  tablet Take 10 mg by mouth daily.    [provider]  amLODipine  (NORVASC ) 5 MG tablet Take 5 mg by mouth daily.    [provider]  calcium carbonate (SUPER CALCIUM) 1500 (600 Ca) MG TABS tablet Take 1 tablet by mouth daily. 02/09/22   [provider]  calcium carbonate 100 mg/ml SUSP Take by mouth.    [provider]  CALCIUM PO Take by mouth.    [provider]  clindamycin  (CLEOCIN ) 150 MG capsule Take 1  capsule (150 mg total) by mouth every 6 (six) hours. 11/15/23   Billy Asberry FALCON, PA-C  erythromycin ophthalmic ointment 1 Application. 02/16/23   [provider]  fluticasone  (FLONASE ) 50 MCG/ACT nasal spray Place 1 spray into both nostrils daily. 12/02/22   Mound, Haley E, FNP  furosemide (LASIX) 20 MG tablet TAKE 1 TABLET BY MOUTH ONCE DAILY AS NEEDED Oral; Duration: 30    [provider]  gabapentin  (NEURONTIN ) 100 MG capsule Take 2 capsules (200 mg total) by mouth every 12 (twelve) hours. Patient not taking: Reported on 04/11/2023 05/04/21   Signe Mitzie LABOR, MD  Ketoprofen (FROTEK) 10 % CREA Apply 1 Application topically. 11/03/22   [provider]  lisinopril (ZESTRIL) 40 MG tablet TAKE 1 TABLET BY MOUTH ONCE DAILY Oral; Duration: 90    [provider]  losartan (COZAAR) 50 MG tablet Take 50 mg by mouth. 02/20/23   [provider]  Multiple Vitamin (QUINTABS) TABS Take 1 tablet by mouth daily. 02/09/22   [provider]  Multiple Vitamins-Minerals (MULTIVITAMIN WITH MINERALS) tablet Take 1 tablet by mouth daily.    [provider]  Olopatadine  HCl 0.2 % SOLN Apply 1 drop to eye daily as needed (eye irritation and burning). 08/07/23   Arloa Suzen RAMAN, NP  pantoprazole  (PROTONIX ) 40 MG tablet Take 1 tablet (40 mg total) by mouth daily. Patient not taking: Reported on 04/11/2023 05/04/21   Signe Mitzie LABOR, MD  pravastatin (PRAVACHOL) 20 MG tablet TAKE 1 TABLET BY MOUTH ONCE DAILY Oral; Duration: 90    [provider]  senna-docusate (SENOKOT-S) 8.6-50 MG tablet Take 2 tablets by mouth. 04/01/15   [provider]  Vitamins-Lipotropics (MEGA MULTIPLE/CHELATED MINERAL) TABS Take 1 tablet by mouth daily.    [provider]    Family History Family History  Problem Relation Age of Onset   Cancer Mother     Social History Social History   Tobacco Use   Smoking status: Never   Smokeless tobacco: Never   Vaping Use   Vaping status: Never Used  Substance Use Topics   Alcohol use: No   Drug use: Never     Allergies   Aspirin, Codeine, Cyclobenzaprine, Ibuprofen, Latex, and Penicillins   Review of Systems Review of Systems   Physical Exam Triage Vital Signs ED Triage Vitals  Encounter Vitals Group     BP 01/24/24 1258 116/78     Girls Systolic BP Percentile --      Girls Diastolic BP Percentile --      Boys Systolic BP Percentile --      Boys Diastolic BP Percentile --      Pulse Rate 01/24/24 1258 72     Resp 01/24/24 1258 18     Temp 01/24/24 1258 98.6 F (37 C)     Temp Source 01/24/24 1258 Oral     SpO2 01/24/24 1258 96 %     Weight 01/24/24 1255 212 lb (96.2 kg)  Height 01/24/24 1255 5' 7 (1.702 m)     Head Circumference --      Peak Flow --      Pain Score 01/24/24 1252 4     Pain Loc --      Pain Education --      Exclude from Growth Chart --    No data found.  Updated Vital Signs BP 116/78 (BP Location: Left Arm)   Pulse 72   Temp 98.6 F (37 C) (Oral)   Resp 18   Ht 5' 7 (1.702 m)   Wt 96.2 kg   LMP 10/01/2012   SpO2 96%   BMI 33.20 kg/m   Visual Acuity Right Eye Distance:   Left Eye Distance:   Bilateral Distance:    Right Eye Near:   Left Eye Near:    Bilateral Near:     Physical Exam Vitals reviewed.  Constitutional:      General: She is not in acute distress.    Appearance: She is not ill-appearing, toxic-appearing or diaphoretic.  Skin:    Coloration: Skin is not jaundiced or pale.     Comments: There is greenish and black discoloration of the proximal third of the right index fingernail.  There is very mild edema of the proximal nail fold.  No drainage no erythema.  Neurological:     Mental Status: She is alert and oriented to person, place, and time.  Psychiatric:        Behavior: Behavior normal.      UC Treatments / Results  Labs (all labs ordered are listed, but only abnormal results are displayed) Labs Reviewed  - No data to display  EKG   Radiology No results found.  Procedures Procedures (including critical care time)  Medications Ordered in UC Medications - No data to display  Initial Impression / Assessment and Plan / UC Course  I have reviewed the triage vital signs and the nursing notes.  Pertinent labs & imaging results that were available during my care of the patient were reviewed by me and considered in my medical decision making (see chart for details).      Terbinafine is sent in for a 6-week course.  According to open evidence reference, will not be necessary to monitor liver enzymes.  She will follow-up with her primary care.  She is between primary cares as she is just moved to [place]. Final Clinical Impressions(s) / UC Diagnoses   Final diagnoses:  Tinea unguium     Discharge Instructions      Terbinafine 250 mg tablets--take 1 daily for 6 weeks  Follow-up with primary care     ED Prescriptions     Medication Sig Dispense Auth. Provider   terbinafine (LAMISIL) 250 MG tablet Take 1 tablet (250 mg total) by mouth daily. 42 tablet Makinzey Banes, Sharlet POUR, MD      PDMP not reviewed this encounter.   Vonna Sharlet POUR, MD 01/24/24 1350
